# Patient Record
Sex: Male | Born: 1960 | Race: White | Hispanic: No | Marital: Married | Smoking: Former smoker
Health system: Southern US, Community
[De-identification: ages and names within clinical notes are randomized; demographics above are authoritative.]

## PROBLEM LIST (undated history)

## (undated) DIAGNOSIS — R972 Elevated prostate specific antigen [PSA]: Secondary | ICD-10-CM

## (undated) DIAGNOSIS — E039 Hypothyroidism, unspecified: Secondary | ICD-10-CM

## (undated) DIAGNOSIS — G510 Bell's palsy: Secondary | ICD-10-CM

## (undated) DIAGNOSIS — D72829 Elevated white blood cell count, unspecified: Secondary | ICD-10-CM

## (undated) DIAGNOSIS — M1990 Unspecified osteoarthritis, unspecified site: Secondary | ICD-10-CM

## (undated) DIAGNOSIS — I10 Essential (primary) hypertension: Secondary | ICD-10-CM

## (undated) DIAGNOSIS — R9431 Abnormal electrocardiogram [ECG] [EKG]: Secondary | ICD-10-CM

## (undated) DIAGNOSIS — E785 Hyperlipidemia, unspecified: Secondary | ICD-10-CM

## (undated) HISTORY — DX: Abnormal electrocardiogram (ECG) (EKG): R94.31

## (undated) HISTORY — DX: Hypothyroidism, unspecified: E03.9

## (undated) HISTORY — DX: Bell's palsy: G51.0

## (undated) HISTORY — DX: Essential (primary) hypertension: I10

## (undated) HISTORY — DX: Unspecified osteoarthritis, unspecified site: M19.90

## (undated) HISTORY — PX: TYMPANOSTOMY TUBE PLACEMENT: SHX32

## (undated) HISTORY — DX: Elevated prostate specific antigen (PSA): R97.20

## (undated) HISTORY — PX: MOUTH SURGERY: SHX715

## (undated) HISTORY — DX: Elevated white blood cell count, unspecified: D72.829

## (undated) HISTORY — DX: Hyperlipidemia, unspecified: E78.5

## (undated) HISTORY — PX: TONSILLECTOMY AND ADENOIDECTOMY: SUR1326

---

## 2003-12-30 ENCOUNTER — Ambulatory Visit (HOSPITAL_COMMUNITY): Admission: RE | Admit: 2003-12-30 | Discharge: 2003-12-30 | Payer: Self-pay | Admitting: Hematology & Oncology

## 2004-07-12 ENCOUNTER — Ambulatory Visit: Payer: Self-pay | Admitting: Internal Medicine

## 2005-07-17 ENCOUNTER — Ambulatory Visit: Payer: Self-pay | Admitting: Internal Medicine

## 2005-07-19 ENCOUNTER — Ambulatory Visit: Payer: Self-pay | Admitting: Internal Medicine

## 2006-01-15 ENCOUNTER — Ambulatory Visit: Payer: Self-pay | Admitting: Internal Medicine

## 2006-01-17 ENCOUNTER — Encounter: Admission: RE | Admit: 2006-01-17 | Discharge: 2006-03-05 | Payer: Self-pay | Admitting: Internal Medicine

## 2006-05-21 ENCOUNTER — Ambulatory Visit: Payer: Self-pay | Admitting: Internal Medicine

## 2006-11-14 DIAGNOSIS — I10 Essential (primary) hypertension: Secondary | ICD-10-CM | POA: Insufficient documentation

## 2006-11-14 DIAGNOSIS — E039 Hypothyroidism, unspecified: Secondary | ICD-10-CM | POA: Insufficient documentation

## 2006-11-14 HISTORY — DX: Essential (primary) hypertension: I10

## 2007-05-23 ENCOUNTER — Emergency Department (HOSPITAL_COMMUNITY): Admission: EM | Admit: 2007-05-23 | Discharge: 2007-05-23 | Payer: Self-pay | Admitting: Emergency Medicine

## 2007-06-24 ENCOUNTER — Telehealth (INDEPENDENT_AMBULATORY_CARE_PROVIDER_SITE_OTHER): Payer: Self-pay | Admitting: *Deleted

## 2007-12-03 ENCOUNTER — Ambulatory Visit: Payer: Self-pay | Admitting: Internal Medicine

## 2007-12-03 DIAGNOSIS — E785 Hyperlipidemia, unspecified: Secondary | ICD-10-CM

## 2007-12-03 DIAGNOSIS — R9431 Abnormal electrocardiogram [ECG] [EKG]: Secondary | ICD-10-CM

## 2007-12-03 DIAGNOSIS — Z87891 Personal history of nicotine dependence: Secondary | ICD-10-CM | POA: Insufficient documentation

## 2007-12-03 DIAGNOSIS — F17200 Nicotine dependence, unspecified, uncomplicated: Secondary | ICD-10-CM

## 2007-12-03 DIAGNOSIS — Z8669 Personal history of other diseases of the nervous system and sense organs: Secondary | ICD-10-CM | POA: Insufficient documentation

## 2007-12-03 DIAGNOSIS — E1169 Type 2 diabetes mellitus with other specified complication: Secondary | ICD-10-CM | POA: Insufficient documentation

## 2007-12-05 ENCOUNTER — Encounter (INDEPENDENT_AMBULATORY_CARE_PROVIDER_SITE_OTHER): Payer: Self-pay | Admitting: *Deleted

## 2007-12-25 DIAGNOSIS — R9431 Abnormal electrocardiogram [ECG] [EKG]: Secondary | ICD-10-CM

## 2007-12-25 HISTORY — DX: Abnormal electrocardiogram (ECG) (EKG): R94.31

## 2008-01-22 ENCOUNTER — Ambulatory Visit: Payer: Self-pay

## 2008-01-22 ENCOUNTER — Encounter: Payer: Self-pay | Admitting: Internal Medicine

## 2008-02-04 ENCOUNTER — Telehealth (INDEPENDENT_AMBULATORY_CARE_PROVIDER_SITE_OTHER): Payer: Self-pay | Admitting: *Deleted

## 2008-03-03 ENCOUNTER — Ambulatory Visit: Payer: Self-pay | Admitting: Internal Medicine

## 2008-05-18 ENCOUNTER — Ambulatory Visit: Payer: Self-pay | Admitting: Internal Medicine

## 2008-05-18 DIAGNOSIS — J209 Acute bronchitis, unspecified: Secondary | ICD-10-CM

## 2008-05-18 DIAGNOSIS — E119 Type 2 diabetes mellitus without complications: Secondary | ICD-10-CM | POA: Insufficient documentation

## 2008-05-20 ENCOUNTER — Telehealth (INDEPENDENT_AMBULATORY_CARE_PROVIDER_SITE_OTHER): Payer: Self-pay | Admitting: *Deleted

## 2008-05-20 LAB — CONVERTED CEMR LAB
Hgb A1c MFr Bld: 6.2 % — ABNORMAL HIGH (ref 4.6–6.0)
Triglycerides: 133 mg/dL (ref 0–149)

## 2008-06-26 DIAGNOSIS — R972 Elevated prostate specific antigen [PSA]: Secondary | ICD-10-CM

## 2008-06-26 HISTORY — DX: Elevated prostate specific antigen (PSA): R97.20

## 2008-08-10 ENCOUNTER — Encounter (INDEPENDENT_AMBULATORY_CARE_PROVIDER_SITE_OTHER): Payer: Self-pay | Admitting: *Deleted

## 2008-08-10 ENCOUNTER — Ambulatory Visit: Payer: Self-pay | Admitting: Family Medicine

## 2008-08-17 ENCOUNTER — Ambulatory Visit: Payer: Self-pay | Admitting: Family Medicine

## 2008-08-31 ENCOUNTER — Ambulatory Visit: Payer: Self-pay | Admitting: Family Medicine

## 2008-09-17 ENCOUNTER — Ambulatory Visit: Payer: Self-pay | Admitting: Internal Medicine

## 2008-10-16 ENCOUNTER — Encounter (INDEPENDENT_AMBULATORY_CARE_PROVIDER_SITE_OTHER): Payer: Self-pay | Admitting: *Deleted

## 2009-01-06 ENCOUNTER — Ambulatory Visit: Payer: Self-pay | Admitting: Internal Medicine

## 2009-01-07 ENCOUNTER — Ambulatory Visit: Payer: Self-pay | Admitting: Internal Medicine

## 2009-01-11 ENCOUNTER — Telehealth (INDEPENDENT_AMBULATORY_CARE_PROVIDER_SITE_OTHER): Payer: Self-pay | Admitting: *Deleted

## 2009-01-11 DIAGNOSIS — R972 Elevated prostate specific antigen [PSA]: Secondary | ICD-10-CM | POA: Insufficient documentation

## 2009-01-12 LAB — CONVERTED CEMR LAB
ALT: 15 units/L (ref 0–53)
AST: 20 units/L (ref 0–37)
Alkaline Phosphatase: 56 units/L (ref 39–117)
Basophils Relative: 2.2 % (ref 0.0–3.0)
Bilirubin, Direct: 0.1 mg/dL (ref 0.0–0.3)
Calcium: 9.1 mg/dL (ref 8.4–10.5)
Chloride: 104 meq/L (ref 96–112)
Creatinine, Ser: 1.3 mg/dL (ref 0.4–1.5)
Eosinophils Relative: 3.9 % (ref 0.0–5.0)
Hgb A1c MFr Bld: 6.1 % (ref 4.6–6.5)
Lymphocytes Relative: 19.1 % (ref 12.0–46.0)
Lymphs Abs: 2.3 10*3/uL (ref 0.7–4.0)
Monocytes Absolute: 0.7 10*3/uL (ref 0.1–1.0)
Monocytes Relative: 5.5 % (ref 3.0–12.0)
Neutrophils Relative %: 69.3 % (ref 43.0–77.0)
PSA: 8.32 ng/mL — ABNORMAL HIGH (ref 0.10–4.00)
Potassium: 3.8 meq/L (ref 3.5–5.1)
RBC: 4.92 M/uL (ref 4.22–5.81)
Total CHOL/HDL Ratio: 4
VLDL: 24.6 mg/dL (ref 0.0–40.0)

## 2009-01-18 ENCOUNTER — Encounter (INDEPENDENT_AMBULATORY_CARE_PROVIDER_SITE_OTHER): Payer: Self-pay | Admitting: *Deleted

## 2009-01-21 ENCOUNTER — Telehealth: Payer: Self-pay | Admitting: Internal Medicine

## 2009-01-25 ENCOUNTER — Ambulatory Visit: Payer: Self-pay | Admitting: Family Medicine

## 2009-02-04 ENCOUNTER — Encounter: Payer: Self-pay | Admitting: Internal Medicine

## 2009-02-24 HISTORY — PX: VASECTOMY: SHX75

## 2009-02-26 ENCOUNTER — Encounter: Payer: Self-pay | Admitting: Internal Medicine

## 2009-03-24 ENCOUNTER — Encounter: Payer: Self-pay | Admitting: Internal Medicine

## 2009-05-18 ENCOUNTER — Ambulatory Visit: Payer: Self-pay | Admitting: Internal Medicine

## 2009-05-19 ENCOUNTER — Encounter (INDEPENDENT_AMBULATORY_CARE_PROVIDER_SITE_OTHER): Payer: Self-pay | Admitting: *Deleted

## 2009-05-19 LAB — CONVERTED CEMR LAB: Hgb A1c MFr Bld: 6 % (ref 4.6–6.5)

## 2009-09-08 ENCOUNTER — Encounter: Payer: Self-pay | Admitting: Internal Medicine

## 2009-10-12 ENCOUNTER — Telehealth (INDEPENDENT_AMBULATORY_CARE_PROVIDER_SITE_OTHER): Payer: Self-pay | Admitting: *Deleted

## 2009-10-18 ENCOUNTER — Ambulatory Visit: Payer: Self-pay | Admitting: Internal Medicine

## 2009-10-18 DIAGNOSIS — M1990 Unspecified osteoarthritis, unspecified site: Secondary | ICD-10-CM | POA: Insufficient documentation

## 2009-10-21 LAB — CONVERTED CEMR LAB
Creatinine, Ser: 1.1 mg/dL (ref 0.4–1.5)
GFR calc non Af Amer: 75.82 mL/min (ref >60)
Sodium: 142 meq/L (ref 135–145)

## 2010-04-27 ENCOUNTER — Ambulatory Visit: Payer: Self-pay | Admitting: Internal Medicine

## 2010-05-10 ENCOUNTER — Ambulatory Visit: Payer: Self-pay | Admitting: Internal Medicine

## 2010-05-15 LAB — CONVERTED CEMR LAB
ALT: 23 units/L (ref 0–53)
BUN: 14 mg/dL (ref 6–23)
Basophils Relative: 0.9 % (ref 0.0–3.0)
Creatinine,U: 97.7 mg/dL
Glucose, Bld: 122 mg/dL — ABNORMAL HIGH (ref 70–99)
HDL: 38.5 mg/dL — ABNORMAL LOW (ref >39.00)
Hemoglobin: 15.5 g/dL (ref 13.0–17.0)
LDL Cholesterol: 101 mg/dL — ABNORMAL HIGH (ref 0–99)
MCHC: 34.3 g/dL (ref 30.0–36.0)
Microalb, Ur: 1.6 mg/dL (ref 0.0–1.9)
Monocytes Relative: 6.4 % (ref 3.0–12.0)
Neutrophils Relative %: 67.6 % (ref 43.0–77.0)
Platelets: 173 10*3/uL (ref 150.0–400.0)
Potassium: 4 meq/L (ref 3.5–5.1)
Sodium: 140 meq/L (ref 135–145)
Triglycerides: 162 mg/dL — ABNORMAL HIGH (ref 0.0–149.0)
VLDL: 32.4 mg/dL (ref 0.0–40.0)
WBC: 11.8 10*3/uL — ABNORMAL HIGH (ref 4.5–10.5)

## 2010-05-18 ENCOUNTER — Ambulatory Visit: Payer: Self-pay | Admitting: Internal Medicine

## 2010-05-23 LAB — CONVERTED CEMR LAB: PSA: 1.54 ng/mL (ref <=4.00)

## 2010-07-24 LAB — CONVERTED CEMR LAB
AST: 16 units/L (ref 0–37)
BUN: 17 mg/dL (ref 6–23)
Basophils Absolute: 0.1 10*3/uL (ref 0.0–0.1)
Basophils Relative: 0.8 % (ref 0.0–1.0)
Bilirubin Urine: NEGATIVE
Blood in Urine, dipstick: NEGATIVE
Calcium: 9.9 mg/dL (ref 8.4–10.5)
Chloride: 108 meq/L (ref 96–112)
Creatinine, Ser: 1.4 mg/dL (ref 0.4–1.5)
GFR calc Af Amer: 70 mL/min
GFR calc non Af Amer: 58 mL/min
GFR calc non Af Amer: 64 mL/min
Glucose, Bld: 95 mg/dL (ref 70–99)
Glucose, Bld: 96 mg/dL (ref 70–99)
HDL: 36.4 mg/dL — ABNORMAL LOW (ref >39.0)
Hemoglobin: 16.4 g/dL (ref 13.0–17.0)
INR: 1 (ref 0.8–1.0)
Ketones, urine, test strip: NEGATIVE
LDL Cholesterol: 113 mg/dL — ABNORMAL HIGH (ref 0–99)
LDL Cholesterol: 117 mg/dL — ABNORMAL HIGH (ref 0–99)
MCHC: 35.2 g/dL (ref 30.0–36.0)
Neutrophils Relative %: 68.2 % (ref 43.0–77.0)
Nitrite: NEGATIVE
Potassium: 3.8 meq/L (ref 3.5–5.1)
Prothrombin Time: 11.8 s (ref 10.9–13.3)
Sodium: 140 meq/L (ref 135–145)
Sodium: 143 meq/L (ref 135–145)
Specific Gravity, Urine: 1.025
Triglyceride fasting, serum: 103 mg/dL (ref 0–149)
VLDL: 21 mg/dL (ref 0–40)
WBC: 9.7 10*3/uL (ref 4.5–10.5)
pH: 5

## 2010-07-26 NOTE — Consult Note (Signed)
Summary: [facility]  [facility]   Imported By: Lanelle Bal 09/15/2009 09:45:32  _____________________________________________________________________  External Attachment:    Type:   Image     Comment:   External Document

## 2010-07-26 NOTE — Assessment & Plan Note (Signed)
Summary: head cold, no fever///sph   Vital Signs:  Patient profile:   50 year old male Weight:      207.25 pounds Temp:     98.5 degrees F oral Pulse rate:   81 / minute Pulse rhythm:   regular BP sitting:   136 / 86  (left arm) Cuff size:   large  Vitals Entered By: Army Fossa CMA (April 27, 2010 11:26 AM) CC: C/o head congestion, drainage, and couging Comments x 1 week [facility]   History of Present Illness: one-week history of  cough,  post nasal dripping, sinus congestion  ROS He has abundant sputum, color varies from clear to yellow No fever or myalgias No nausea or vomiting No headache No sore throat No wheezing  Current Medications (verified): 1)  Hyzaar 100-25 Mg  Tabs (Losartan Potassium-Hctz) .Marland Kitchen.. 1 By Mouth Once Daily 2)  Labetalol Hcl 200 Mg  Tabs (Labetalol Hcl) .Marland Kitchen.. 1 By Mouth Two Times A Day 3)  Vytorin 10-40 Mg  Tabs (Ezetimibe-Simvastatin) .Marland Kitchen.. 1 By Mouth At Bedtime 4)  Synthroid 50 Mcg  Tabs (Levothyroxine Sodium) .Marland Kitchen.. 1 By Mouth Once Daily 5)  Amlodipine Besylate 10 Mg  Tabs (Amlodipine Besylate) .Marland Kitchen.. 1 By Mouth Once Daily 6)  Bayer Childrens Aspirin 81 Mg Chew (Aspirin)  Allergies (verified): 1)  ! Ceclor 2)  ! Augmentin  Past History:  Past Medical History: Reviewed history from 10/18/2009 and no changes required. Hypertension AODM dx 11-09 A1C 6.2 Hypothyroidism Hyperlipidemia DJD-- L knee  Bell's palsy h/o elevated WBC-- saw Dr Lambert Mody : likely benign, monitor EKG abnormal, stress test (-) 12-2007  Past Surgical History: Reviewed history from 05/18/2009 and no changes required. Tonsillectomy + Adenoidectomy Ear tubes B as a child vasectomy 02-2009  Social History: Reviewed history from 10/18/2009 and no changes required. married  2 step kids Current Smoker 1 1/2 ppd Alcohol use-yes (rare) Drug use-no Regular exercise-no but active  baseball player from elementary to college caffeine use - 6 diet sodas once  daily   Physical Exam  General:  alert, well-developed, and well-nourished.   Head:  face symmetric, nontender to palpation Ears:  R ear normal and L ear normal.   Nose:  slightly congested Mouth:  slightly red without discharge Lungs:  mild rhonchi with cough, no wheezing, no distress Heart:  normal rate, regular rhythm, and no murmur.     Impression & Recommendations:  Problem # 1:  BRONCHITIS- ACUTE (ICD-466.0) see  instructions His updated medication list for this problem includes:    Zithromax Z-pak 250 Mg Tabs (Azithromycin) .Marland Kitchen... As directed    Tussigon 5-1.5 Mg Tabs (Hydrocodone-homatropine) .Marland Kitchen... 1 tsp every 6 hours as neded for cough  Problem # 2:  due for a physical has it scheduled in about 3 weeks from now, see instructions   Complete Medication List: 1)  Hyzaar 100-25 Mg Tabs (Losartan potassium-hctz) .Marland Kitchen.. 1 by mouth once daily 2)  Labetalol Hcl 200 Mg Tabs (Labetalol hcl) .Marland Kitchen.. 1 by mouth two times a day 3)  Vytorin 10-40 Mg Tabs (Ezetimibe-simvastatin) .Marland Kitchen.. 1 by mouth at bedtime 4)  Synthroid 50 Mcg Tabs (Levothyroxine sodium) .Marland Kitchen.. 1 by mouth once daily 5)  Amlodipine Besylate 10 Mg Tabs (Amlodipine besylate) .Marland Kitchen.. 1 by mouth once daily 6)  Bayer Childrens Aspirin 81 Mg Chew (Aspirin) 7)  Zithromax Z-pak 250 Mg Tabs (Azithromycin) .... As directed 8)  Tussigon 5-1.5 Mg Tabs (Hydrocodone-homatropine) .Marland Kitchen.. 1 tsp every 6 hours as neded for cough  Patient Instructions: 1)  rest, fluiods, tylenol 2)  zithromax x 5 days 3)  for cough, mucinex DM as needed. If the cough is severe, use hydrocodone (will make you sleepy) 4)  call if no better in few days 5)  few days before your physical came back fasting for labs: 6)  FLP BMP AS ALT TSH CBC---dx v70 7)  A1C ,microalb.---dx DM Prescriptions: TUSSIGON 5-1.5 MG TABS (HYDROCODONE-HOMATROPINE) 1 tsp every 6 hours as neded for cough  #120cc x 0   Entered and Authorized by:   Nolon Rod. Paz MD   Signed by:   Nolon Rod. Paz MD on  04/27/2010   Method used:   Print then Give to Patient   RxID:   [id number] ZITHROMAX Z-PAK 250 MG TABS (AZITHROMYCIN) as directed  #1 x 0   Entered and Authorized by:   Nolon Rod. Paz MD   Signed by:   Nolon Rod. Paz MD on 04/27/2010   Method used:   Print then Give to Patient   RxID:   [id number]    Orders Added: 1)  Est. Patient Level III [70623]   Pharmacy called to notify that rx for Tussigon was written for tabs with liquid syrup instructions. I made them aware to fill for liquid per instructions above. Floydene Flock  April 27, 2010 12:11 PM  thank you  Fenwick E. Paz MD  April 27, 2010 5:04 PM

## 2010-07-26 NOTE — Progress Notes (Signed)
Summary: REFILL  Phone Note Refill Request Message from:  Fax from Pharmacy on October 12, 2009 3:40 PM  LEVOTHYROXINE 0.05MF [address] Valinda Hoar [phone]   Method Requested: Fax to Local Pharmacy Next Appointment Scheduled: 10/18/2009 Initial call taken by: Barb Merino,  October 12, 2009 3:41 PM    Prescriptions: SYNTHROID 50 MCG  TABS (LEVOTHYROXINE SODIUM) 1 by mouth once daily  #30.0 Each x 2   Entered by:   Shary Decamp   Authorized by:   Nolon Rod. Paz MD   Signed by:   Shary Decamp on 10/12/2009   Method used:   Electronically to        [facility] #[id number]* (retail)       [address]       [place], [place]  [address]       Ph: [phone]       Fax: [phone]   RxID:   [id number]

## 2010-07-26 NOTE — Assessment & Plan Note (Signed)
Summary: cpx///sph   Vital Signs:  Patient profile:   50 year old male Weight:      208.25 pounds Pulse rate:   76 / minute Pulse rhythm:   regular BP sitting:   134 / 86  (left arm) Cuff size:   large  Vitals Entered By: Army Fossa CMA (May 18, 2010 1:54 PM) CC: CPX, not fasting Comments Still having some chest congestion [facility] [address] Declines flu shot    History of Present Illness: CPX   Preventive Screening-Counseling & Management  Alcohol-Tobacco     Packs/Day: 1  Current Medications (verified): 1)  Hyzaar 100-25 Mg  Tabs (Losartan Potassium-Hctz) .Marland Kitchen.. 1 By Mouth Once Daily 2)  Labetalol Hcl 200 Mg  Tabs (Labetalol Hcl) .Marland Kitchen.. 1 By Mouth Two Times A Day 3)  Vytorin 10-40 Mg  Tabs (Ezetimibe-Simvastatin) .Marland Kitchen.. 1 By Mouth At Bedtime 4)  Synthroid 50 Mcg  Tabs (Levothyroxine Sodium) .Marland Kitchen.. 1 By Mouth Once Daily 5)  Amlodipine Besylate 10 Mg  Tabs (Amlodipine Besylate) .Marland Kitchen.. 1 By Mouth Once Daily 6)  Bayer Childrens Aspirin 81 Mg Chew (Aspirin)  Allergies (verified): 1)  ! Ceclor 2)  ! Augmentin  Past History:  Past Medical History: Hypertension AODM dx 11-09 A1C 6.2 Hypothyroidism Hyperlipidemia DJD-- L knee  Bell's palsy h/o elevated WBC-- saw Dr Lambert Mody : likely benign, monitor EKG abnormal, stress test (-) 12-2007 PSA elevated 2010, saw urology, PSA was rechecked and found to be normal. Bx was canceled; did get a u/s d/t microhematuris--> normal   Past Surgical History: Reviewed history from 05/18/2009 and no changes required. Tonsillectomy + Adenoidectomy Ear tubes B as a child vasectomy 02-2009  Family History: Reviewed history from 12/03/2007 and no changes required. PT ADOPTED  Social History: married  2 step kids Current Smoker 1 1/2 ppd Alcohol use-yes (rare) Drug use-no Regular exercise-no but active (yard work mostly) not doing much lately. Taking care of his parents. baseball player from elementary to college caffeine use -  6 diet sodas once daily  job-- Designer, industrial/product  Packs/Day:  1  Review of Systems General:  Denies fatigue, fever, and weight loss. CV:  Denies chest pain or discomfort and swelling of feet. Resp:  Denies coughing up blood; still has chest congestion, some cough, clear mucus no SOB some wheezing at night . GI:  Denies bloody stools, diarrhea, nausea, and vomiting. GU:  Denies dysuria, hematuria, urinary frequency, and urinary hesitancy. Psych:  ++ stress due to parent's health but handeling it ok.  Physical Exam  General:  alert, well-developed, and overweight-appearing.   Neck:  no masses and no thyromegaly.   Lungs:  normal respiratory effort, no intercostal retractions, no accessory muscle use, and normal breath sounds.   Heart:  normal rate, regular rhythm, and no murmur.   Abdomen:  soft, non-tender, no distention, no masses, no guarding, and no rigidity.   Rectal:  No external abnormalities noted. Normal sphincter tone. No rectal masses or tenderness.   Prostate:  Prostate gland firm and smooth, no enlargement, nodularity, tenderness, mass, asymmetry or induration. Extremities:  no pretibial edema bilaterally  Psych:  Cognition and judgment appear intact. Alert and cooperative with normal attention span and concentration, not anxious appearing and not depressed appearing.     Impression & Recommendations:  Problem # 1:  HEALTH SCREENING (ICD-V70.0) Td 2005 flu shot --declined,  explained benefits  12-2003: Cscope , TICs, hyperplastic polyps---next 2013 we'll order a PSA  CV RF: I discussed the patient  and that he is a heavy smoker, has diabetes, high cholesterol and hypertension. These puts him at  a high risk for heart disease, strokes, kidney failure.  The best thing he can do is to become more physical active, quit tobacco; we need to work together on keeping his diabetes cholesterol and BP under control.  his labs showed that the diabetes is well  controlled His LDL is 101, needs better control. Instead of increasing his medication, I recommend him to work on his lifestyle  Orders: Venipuncture ([address]) Specimen Handling (60454)  Problem # 2:  BRONCHITIS-ACUTE (ICD-466.0) symptoms slight improving   Complete Medication List: 1)  Hyzaar 100-25 Mg Tabs (Losartan potassium-hctz) .Marland Kitchen.. 1 by mouth once daily 2)  Labetalol Hcl 200 Mg Tabs (Labetalol hcl) .Marland Kitchen.. 1 by mouth two times a day 3)  Vytorin 10-40 Mg Tabs (Ezetimibe-simvastatin) .Marland Kitchen.. 1 by mouth at bedtime 4)  Synthroid 50 Mcg Tabs (Levothyroxine sodium) .Marland Kitchen.. 1 by mouth once daily 5)  Amlodipine Besylate 10 Mg Tabs (Amlodipine besylate) .Marland Kitchen.. 1 by mouth once daily 6)  Bayer Childrens Aspirin 81 Mg Chew (Aspirin)  Patient Instructions: 1)  Please schedule a follow-up appointment in 3 months .    Orders Added: 1)  Venipuncture [09811] 2)  Specimen Handling [99000] 3)  Est. Patient age 26-64 [92]     Risk Factors:

## 2010-07-26 NOTE — Assessment & Plan Note (Signed)
Summary: 4 mo. f/u - jr   Vital Signs:  Patient profile:   50 year old male Height:      71.5 inches Weight:      213 pounds BMI:     29.40 Pulse rate:   64 / minute BP sitting:   122 / 80  Vitals Entered By: Shary Decamp (October 18, 2009 8:04 AM) CC: rov, fasting   History of Present Illness: Hypertension-- no ambulatory BPs  AODM -- diet ok,exercise limited by knee pain  Hypothyroidism-- good medication compliance  Hyperlipidemia-- good medication compliance, no s/e that he can tell from the cholesterol medicine  Current Medications (verified): 1)  Hyzaar 100-25 Mg  Tabs (Losartan Potassium-Hctz) .Marland Kitchen.. 1 By Mouth Once Daily 2)  Vytorin 10-40 Mg  Tabs (Ezetimibe-Simvastatin) .Marland Kitchen.. 1 By Mouth At Bedtime 3)  Synthroid 50 Mcg  Tabs (Levothyroxine Sodium) .Marland Kitchen.. 1 By Mouth Once Daily 4)  Labetalol Hcl 200 Mg  Tabs (Labetalol Hcl) .Marland Kitchen.. 1 By Mouth Two Times A Day 5)  Amlodipine Besylate 10 Mg  Tabs (Amlodipine Besylate) .Marland Kitchen.. 1 By Mouth Once Daily 6)  Bayer Childrens Aspirin 81 Mg Chew (Aspirin)  Allergies (verified): 1)  ! Ceclor 2)  ! 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 Resp:  Denies cough and shortness of breath. GI:  Denies diarrhea, nausea, and vomiting. MS:  had severe pain-swelling L knee, was seen at the UC,  doing better .  Physical Exam  General:  alert and well-developed.   Neck:  normal carotid upstroke.   Lungs:  normal respiratory effort, no intercostal retractions, no accessory muscle use, and normal breath sounds.   Heart:  normal rate, regular rhythm, and no murmur.   Extremities:  no pretibial edema bilaterally  Psych:  Oriented X3, memory intact for recent and remote, normally interactive, good eye contact, not anxious appearing, and not depressed appearing.     Impression & Recommendations:  Problem # 1:  DEGENERATIVE JOINT DISEASE (ICD-715.90) L knee OA, was seen at an UC with pain and swelling, doing better long h/o problems in that  knee , may need to see ortho at some point His updated medication list for this problem includes:    Bayer Childrens Aspirin 81 Mg Chew (Aspirin)  Problem # 2:  AODM (ICD-250.00) labs, encouraged diet-exercise  His updated medication list for this problem includes:    Hyzaar 100-25 Mg Tabs (Losartan potassium-hctz) .Marland Kitchen... 1 by mouth once daily    Bayer Childrens Aspirin 81 Mg Chew (Aspirin)  Orders: TLB-A1C / Hgb A1C (Glycohemoglobin) (83036-A1C)  Labs Reviewed: Creat: 1.3 (01/07/2009)    Reviewed HgBA1c results: 6.0 (05/18/2009)  6.1 (01/07/2009)  Problem # 3:  HYPERLIPIDEMIA (ICD-272.4) at goal , RF His updated medication list for this problem includes:    Vytorin 10-40 Mg Tabs (Ezetimibe-simvastatin) .Marland Kitchen... 1 by mouth at bedtime  Labs Reviewed: SGOT: 20 (01/07/2009)   SGPT: 15 (01/07/2009)   HDL:34.40 (01/07/2009), 35.0 (05/18/2008)  LDL:93 (01/07/2009), 92 (05/18/2008)  Chol:152 (01/07/2009), 154 (05/18/2008)  Trig:123.0 (01/07/2009), 133 (05/18/2008)  Problem # 4:  HYPERTENSION (ICD-401.9) at goal, labs, RF His updated medication list for this problem includes:    Hyzaar 100-25 Mg Tabs (Losartan potassium-hctz) .Marland Kitchen... 1 by mouth once daily    Labetalol Hcl 200 Mg Tabs (Labetalol hcl) .Marland Kitchen... 1 by mouth two times a day     Amlodipine Besylate 10 Mg Tabs (Amlodipine besylate) .Marland Kitchen... 1 by mouth once daily  BP today: 122/80 Prior BP: 118/60 (05/18/2009)  Labs Reviewed: K+: 3.8 (01/07/2009) Creat: : 1.3 (01/07/2009)   Chol: 152 (01/07/2009)   HDL: 34.40 (01/07/2009)   LDL: 93 (01/07/2009)   TG: 123.0 (01/07/2009)  Orders: Venipuncture ([address]) TLB-BMP (Basic Metabolic Panel-BMET) (80048-METABOL)  Complete Medication List: 1)  Hyzaar 100-25 Mg Tabs (Losartan potassium-hctz) .Marland Kitchen.. 1 by mouth once daily 2)  Labetalol Hcl 200 Mg Tabs (Labetalol hcl) .Marland Kitchen.. 1 by mouth two times a day 3)  Vytorin 10-40 Mg Tabs (Ezetimibe-simvastatin) .Marland Kitchen.. 1 by mouth at bedtime 4)  Synthroid 50 Mcg Tabs (Levothyroxine sodium) .Marland Kitchen.. 1 by mouth once daily 5)  Amlodipine Besylate 10 Mg Tabs (Amlodipine besylate) .Marland Kitchen.. 1 by mouth once daily 6)  Bayer Childrens Aspirin 81 Mg Chew (Aspirin)  Patient Instructions: 1)  Please schedule a follow-up appointment in 4 months .  Prescriptions: AMLODIPINE BESYLATE 10 MG  TABS (AMLODIPINE BESYLATE) 1 by mouth once daily  #30.0 Each x 12   Entered and Authorized by:   Jemima Petko E. Tyger Wichman MD   Signed by:   Nolon Rod. Alexarae Oliva MD on 10/18/2009   Method used:   Electronically to        [facility] #[id number]* (retail)       [address]       [place], [place]  [address]       Ph: [phone]       Fax: [phone]   RxID:   [id number] SYNTHROID 50 MCG  TABS (LEVOTHYROXINE SODIUM) 1 by mouth once daily  #30.0 Each x 12   Entered and Authorized by:   Nolon Rod. Antino Mayabb MD   Signed by:   Nolon Rod. Jacaden Forbush MD on 10/18/2009   Method used:   Electronically to        [facility] #[id number]* (retail)       [address]       [place], [place]  [address]       Ph: [phone]       Fax: [phone]   RxID:   [id number] VYTORIN 10-40 MG  TABS (EZETIMIBE-SIMVASTATIN) 1 by mouth at bedtime  #30.0 Each x 12   Entered and Authorized by:   Genoveva Singleton  E. Emilyann Banka MD   Signed by:   Nolon Rod. Valjean Ruppel MD on 10/18/2009   Method used:   Electronically to        [facility] #[id number]* (retail)       [address]       [place], [place]  [address]       Ph: [phone]       Fax: [phone]   RxID:   [id number] HYZAAR 100-25 MG  TABS (LOSARTAN POTASSIUM-HCTZ) 1 by mouth once  daily  #30.0 Each x 12   Entered and Authorized by:   Katrinka Herbison E. Izaak Sahr MD   Signed by:   Nolon Rod. Tavio Biegel MD on 10/18/2009   Method used:   Electronically to        [facility] [phone]* (retail)       [address]       [place], [place]  [address]       Ph: [phone]       Fax: [phone]   RxID:   [id number] LABETALOL HCL 200 MG  TABS (LABETALOL HCL) 1 by mouth two times a day  #60.0 Each x 12   Entered and Authorized by:   Nolon Rod. Russia Scheiderer MD   Signed by:   Nolon Rod. Adeliz Tonkinson MD on 10/18/2009   Method used:   Electronically to        [facility] #[id number]* (retail)       [address]       [place], [place]  [address]       Ph: [phone]       Fax: [phone]   RxID:   [id number]

## 2010-08-18 ENCOUNTER — Ambulatory Visit: Payer: Self-pay | Admitting: Internal Medicine

## 2010-08-19 ENCOUNTER — Other Ambulatory Visit: Payer: Self-pay | Admitting: Internal Medicine

## 2010-08-19 ENCOUNTER — Encounter: Payer: Self-pay | Admitting: Internal Medicine

## 2010-08-19 ENCOUNTER — Ambulatory Visit (INDEPENDENT_AMBULATORY_CARE_PROVIDER_SITE_OTHER): Payer: 59 | Admitting: Internal Medicine

## 2010-08-19 DIAGNOSIS — E119 Type 2 diabetes mellitus without complications: Secondary | ICD-10-CM

## 2010-08-19 DIAGNOSIS — E039 Hypothyroidism, unspecified: Secondary | ICD-10-CM

## 2010-08-19 DIAGNOSIS — E785 Hyperlipidemia, unspecified: Secondary | ICD-10-CM

## 2010-08-19 LAB — LIPID PANEL
Cholesterol: 145 mg/dL (ref 0–200)
LDL Cholesterol: 93 mg/dL (ref 0–99)
Total CHOL/HDL Ratio: 4
VLDL: 17.8 mg/dL (ref 0.0–40.0)

## 2010-08-23 NOTE — Assessment & Plan Note (Signed)
Summary: 3 month ov/ph   Vital Signs:  Patient profile:   50 year old male Height:      715 inches Weight:      213 pounds BMI:     0.29 Pulse rate:   78 / minute Pulse rhythm:   regular BP sitting:   132 / 78  (left arm) Cuff size:   large  Vitals Entered By: Army Fossa CMA (August 19, 2010 9:36 AM) CC: 3 month f/u- fasting  Comments no complaints [facility] [facility]   History of Present Illness:  routine office visit  since the last time, his lifestyle has not changed much, he continues smoking, he has gained a few pounds  ROS  good medication compliance with all his medicines  ambulatory blood pressures are within normal limits stress   has been slightly higher than usual, his wife had extensive surgery at [facility]   Current Medications (verified): 1)  Hyzaar 100-25 Mg  Tabs (Losartan Potassium-Hctz) .Marland Kitchen.. 1 By Mouth Once Daily 2)  Labetalol Hcl 200 Mg  Tabs (Labetalol Hcl) .Marland Kitchen.. 1 By Mouth Two Times A Day 3)  Vytorin 10-40 Mg  Tabs (Ezetimibe-Simvastatin) .Marland Kitchen.. 1 By Mouth At Bedtime 4)  Synthroid 50 Mcg  Tabs (Levothyroxine Sodium) .Marland Kitchen.. 1 By Mouth Once Daily 5)  Amlodipine Besylate 10 Mg  Tabs (Amlodipine Besylate) .Marland Kitchen.. 1 By Mouth Once Daily 6)  Bayer Childrens Aspirin 81 Mg Chew (Aspirin)  Allergies (verified): 1)  ! Ceclor 2)  ! Augmentin  Past History:  Past Medical History: Reviewed history from 05/18/2010 and no changes required. Hypertension AODM dx 11-09 A1C 6.2 Hypothyroidism Hyperlipidemia DJD-- L knee  Bell's palsy h/o elevated WBC-- saw Dr Lambert Mody : likely benign, monitor EKG abnormal, stress test (-) 12-2007 PSA elevated 2010, saw urology, PSA was rechecked and found to be normal. Bx was canceled; did get a u/s d/t microhematuris--> normal   Past Surgical History: Reviewed history from 05/18/2009 and no changes required. Tonsillectomy + Adenoidectomy Ear tubes B as a child vasectomy 02-2009  Social History: Reviewed history from  05/18/2010 and no changes required. married  2 step kids Current Smoker 1 1/2 ppd Alcohol use-yes (rare) Drug use-no Regular exercise-no but active (yard work mostly) not doing much lately. Taking care of his parents. 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   Lungs:  normal respiratory effort, no intercostal retractions, no accessory muscle use, and normal breath sounds.   Heart:  normal rate, regular rhythm, and no murmur.   Extremities:  no pretibial edema bilaterally    Impression & Recommendations:  Problem # 1:  HYPERLIPIDEMIA (ICD-272.4) his last LDL was 101, goal 70. labs, if not at goal will change to crestor  His updated medication list for this problem includes:    Vytorin 10-40 Mg Tabs (Ezetimibe-simvastatin) .Marland Kitchen... 1 by mouth at bedtime  Orders: TLB-Lipid Panel (80061-LIPID) Specimen Handling (04540)  Labs Reviewed: SGOT: 25 (05/10/2010)   SGPT: 23 (05/10/2010)   HDL:38.50 (05/10/2010), 34.40 (01/07/2009)  LDL:101 (05/10/2010), 93 (98/04/9146)  Chol:172 (05/10/2010), 152 (01/07/2009)  Trig:162.0 (05/10/2010), 123.0 (01/07/2009)  Problem # 2:  AODM (ICD-250.00) labs  I again encouraged diet, exercise, quit tobacco   Again  I discussed that without  a healthy lifestyle, it would be more difficult to get him to goal and decrease his cardiovascular risk factors His updated medication list for  this problem includes:    Hyzaar 100-25 Mg Tabs (Losartan potassium-hctz) .Marland Kitchen... 1 by mouth once daily    Bayer Childrens Aspirin 81 Mg Chew (Aspirin)  Orders: Venipuncture ([address]40) TLB-A1C / Hgb A1C (Glycohemoglobin) (83036-A1C)  Labs Reviewed: Creat: 1.3 (05/10/2010)    Reviewed HgBA1c results: 6.4 (05/10/2010)  6.5 (10/18/2009)  Problem # 3:  HYPOTHYROIDISM (ICD-244.9)  his last TSH was 4.9, I increase his Synthroid from  50 to 75 mg hoping that this change may help with triglycerides and cholesterol control His updated medication list for this problem includes:    Synthroid 75 Mcg Tabs (Levothyroxine sodium) .Marland Kitchen... 1 by mouth once daily  Labs Reviewed: TSH: 4.97 (05/10/2010)    HgBA1c: 6.4 (05/10/2010) Chol: 172 (05/10/2010)   HDL: 38.50 (05/10/2010)   LDL: 101 (05/10/2010)   TG: 162.0 (05/10/2010)  Complete Medication List: 1)  Hyzaar 100-25 Mg Tabs (Losartan potassium-hctz) .Marland Kitchen.. 1 by mouth once daily 2)  Labetalol Hcl 200 Mg Tabs (Labetalol hcl) .Marland Kitchen.. 1 by mouth two times a day 3)  Vytorin 10-40 Mg Tabs (Ezetimibe-simvastatin) .Marland Kitchen.. 1 by mouth at bedtime 4)  Synthroid 75 Mcg Tabs (Levothyroxine sodium) .Marland Kitchen.. 1 by mouth once daily 5)  Amlodipine Besylate 10 Mg Tabs (Amlodipine besylate) .Marland Kitchen.. 1 by mouth once daily 6)  Bayer Childrens Aspirin 81 Mg Chew (Aspirin)  Patient Instructions: 1)  Please schedule a follow-up appointment in 3 months .  Prescriptions: SYNTHROID 75 MCG TABS (LEVOTHYROXINE SODIUM) 1 by mouth once daily  #90 x 1   Entered and Authorized by:   Elita Quick E. Deunta Beneke MD   Signed by:   Nolon Rod. Anne Sebring MD on 08/19/2010   Method used:   Electronically to        [facility] [phone]* (retail)       [address]       [place], [place]  [address]       Ph: [phone]       Fax: [phone]   RxID:   [id number]    Orders Added: 1)  Venipuncture [36415] 2)  TLB-A1C / Hgb A1C (Glycohemoglobin) [83036-A1C] 3)  TLB-Lipid Panel [80061-LIPID] 4)  Specimen Handling [99000] 5)  Est. Patient Level III [64[address]]

## 2010-10-31 ENCOUNTER — Other Ambulatory Visit: Payer: Self-pay | Admitting: Internal Medicine

## 2010-11-13 ENCOUNTER — Emergency Department (HOSPITAL_BASED_OUTPATIENT_CLINIC_OR_DEPARTMENT_OTHER)
Admission: EM | Admit: 2010-11-13 | Discharge: 2010-11-13 | Disposition: A | Discharge status: Home / Self Care | Payer: 59 | Attending: Emergency Medicine | Admitting: Emergency Medicine

## 2010-11-13 DIAGNOSIS — E039 Hypothyroidism, unspecified: Secondary | ICD-10-CM | POA: Insufficient documentation

## 2010-11-13 DIAGNOSIS — Y92009 Unspecified place in unspecified non-institutional (private) residence as the place of occurrence of the external cause: Secondary | ICD-10-CM | POA: Insufficient documentation

## 2010-11-13 DIAGNOSIS — I10 Essential (primary) hypertension: Secondary | ICD-10-CM | POA: Insufficient documentation

## 2010-11-13 DIAGNOSIS — S61409A Unspecified open wound of unspecified hand, initial encounter: Secondary | ICD-10-CM | POA: Insufficient documentation

## 2010-11-13 DIAGNOSIS — W010XXA Fall on same level from slipping, tripping and stumbling without subsequent striking against object, initial encounter: Secondary | ICD-10-CM | POA: Insufficient documentation

## 2010-11-13 DIAGNOSIS — E785 Hyperlipidemia, unspecified: Secondary | ICD-10-CM | POA: Insufficient documentation

## 2010-11-22 ENCOUNTER — Ambulatory Visit (INDEPENDENT_AMBULATORY_CARE_PROVIDER_SITE_OTHER)
Admission: RE | Admit: 2010-11-22 | Discharge: 2010-11-22 | Disposition: A | Discharge status: Home / Self Care | Payer: 59 | Source: Ambulatory Visit | Attending: Internal Medicine | Admitting: Internal Medicine

## 2010-11-22 ENCOUNTER — Ambulatory Visit (INDEPENDENT_AMBULATORY_CARE_PROVIDER_SITE_OTHER): Payer: 59 | Admitting: Internal Medicine

## 2010-11-22 ENCOUNTER — Encounter: Payer: Self-pay | Admitting: Internal Medicine

## 2010-11-22 DIAGNOSIS — S93409A Sprain of unspecified ligament of unspecified ankle, initial encounter: Secondary | ICD-10-CM

## 2010-11-22 DIAGNOSIS — E039 Hypothyroidism, unspecified: Secondary | ICD-10-CM

## 2010-11-22 DIAGNOSIS — E785 Hyperlipidemia, unspecified: Secondary | ICD-10-CM

## 2010-11-22 DIAGNOSIS — I10 Essential (primary) hypertension: Secondary | ICD-10-CM

## 2010-11-22 DIAGNOSIS — S61409A Unspecified open wound of unspecified hand, initial encounter: Secondary | ICD-10-CM | POA: Insufficient documentation

## 2010-11-22 LAB — BASIC METABOLIC PANEL
CO2: 25 mEq/L (ref 19–32)
Chloride: 106 mEq/L (ref 96–112)
Creatinine, Ser: 1 mg/dL (ref 0.4–1.5)
Glucose, Bld: 115 mg/dL — ABNORMAL HIGH (ref 70–99)
Sodium: 143 mEq/L (ref 135–145)

## 2010-11-22 LAB — TSH: TSH: 3.49 u[IU]/mL (ref 0.35–5.50)

## 2010-11-22 LAB — LIPID PANEL
LDL Cholesterol: 89 mg/dL (ref 0–99)
Triglycerides: 123 mg/dL (ref 0.0–149.0)
VLDL: 24.6 mg/dL (ref 0.0–40.0)

## 2010-11-22 LAB — ALT: ALT: 17 U/L (ref 0–53)

## 2010-11-22 LAB — AST: AST: 18 U/L (ref 0–37)

## 2010-11-22 NOTE — Assessment & Plan Note (Signed)
Tolerates Crestor well. Labs

## 2010-11-22 NOTE — Assessment & Plan Note (Signed)
labs

## 2010-11-22 NOTE — Assessment & Plan Note (Addendum)
Here for stitches removal, the distal area of the wound is not completely healed but I will go ahead and remove the stitches, I'm afraid that if I leave them in, he may develop infection. Wound care  Discussed.

## 2010-11-22 NOTE — Assessment & Plan Note (Signed)
The patient injured his ankle, is not getting much better. Recommend leg elevation, ice, Motrin. We'll get x-ray. If the x-ray is negative, will consider ultrasound to rule out a clot.

## 2010-11-22 NOTE — Progress Notes (Signed)
  Subjective:    Patient ID: IMRI LOR, male    DOB: Feb 27, 1961, 50 y.o.   MRN: [id number]  HPI Had a fall 9 days ago, went to the ER: stitches were put on the right hand. He also twisted his left ankle, he continued to have pain which is about the same, he continued to have swelling which is on and off and actually not getting any better. No x-rays were done. Hyperlipidemia, base on  last cholesterol panel was switch him from Vytorin to Crestor. Good medication compliance.  Past Medical History  Diagnosis Date  . Hypertension   . Diabetes mellitus 11/09    A1c- 6.2  . Hypothyroidism   . Hyperlipidemia   . DJD (degenerative joint disease)     l knee  . Bell's palsy   . Elevated WBC count     saw Dr.Enenever; likely benign, monitor  . EKG, abnormal 12-2007    Stress test (-)   . Elevated PSA 2010    saw urology, PSA was reched and found to be normal. Bx was canceled, did get a u/s d/t michrohematuris- normal   Past Surgical History  Procedure Date  . Tonsillectomy and adenoidectomy   . Tympanostomy tube placement     B as a child  . Vasectomy 02/2009     Review of Systems Denies chest pain or shortness of breath Tolerating crestor  well, denies any myalgias. As far as the diabetes, he does not check ambulatory blood sugars, there has been some positive changes on his diet. Denies any redness, discharge or swelling in the area of the stitches in his right hand.     Objective:   Physical Exam  Constitutional: He is oriented to person, place, and time. He appears well-developed and well-nourished.  Cardiovascular: Normal rate, regular rhythm and normal heart sounds.   No murmur heard. Pulmonary/Chest: Effort normal and breath sounds normal. No respiratory distress. He has no wheezes. He has no rales.  Musculoskeletal:       Right ankle and foot normal. Left ankle swollen, tender and the external malleolus. No deformity. Range of motion slightly decreased due to  swelling and pain. Good pedal pulses and capillary refill throughout. Calves: Symmetric and nontender.  Neurological: He is alert and oriented to person, place, and time.  Skin:       Has a 2 cm laceration on the palmar side of the right hand, no redness, discharge, swelling. The most distal area is not completely healed.  Psychiatric: He has a normal mood and affect. His behavior is normal. Judgment and thought content normal.          Assessment & Plan:

## 2010-11-22 NOTE — Assessment & Plan Note (Signed)
Continue same medicines. Labs

## 2010-11-23 ENCOUNTER — Telehealth: Payer: Self-pay

## 2010-11-23 DIAGNOSIS — T148XXA Other injury of unspecified body region, initial encounter: Secondary | ICD-10-CM

## 2010-11-23 NOTE — Telephone Encounter (Signed)
Pt calling would like to know results of ankle xray

## 2010-11-24 NOTE — Telephone Encounter (Signed)
Please apologize for the delay. The x-rays showed possible an avulsion fracture, please arrange a orthopedic referral. Keep the leg elevated. Cholesterol improve, closer to goal. Continue with Crestor. TSH okay, continue with same center

## 2010-11-24 NOTE — Telephone Encounter (Signed)
Pt calling again to check on xray results

## 2010-11-24 NOTE — Telephone Encounter (Signed)
I spoke w/ pt he is aware.  

## 2010-11-24 NOTE — Telephone Encounter (Signed)
Addended by: Army Fossa R on: 11/24/2010 02:26 PM   Modules accepted: Orders

## 2010-12-05 ENCOUNTER — Other Ambulatory Visit: Payer: Self-pay | Admitting: Internal Medicine

## 2011-01-07 ENCOUNTER — Other Ambulatory Visit: Payer: Self-pay | Admitting: Internal Medicine

## 2011-02-15 ENCOUNTER — Other Ambulatory Visit: Payer: Self-pay | Admitting: Internal Medicine

## 2011-03-28 ENCOUNTER — Encounter: Payer: Self-pay | Admitting: Internal Medicine

## 2011-03-28 ENCOUNTER — Ambulatory Visit (INDEPENDENT_AMBULATORY_CARE_PROVIDER_SITE_OTHER): Payer: 59 | Admitting: Internal Medicine

## 2011-03-28 DIAGNOSIS — E119 Type 2 diabetes mellitus without complications: Secondary | ICD-10-CM

## 2011-03-28 DIAGNOSIS — I10 Essential (primary) hypertension: Secondary | ICD-10-CM

## 2011-03-28 LAB — BASIC METABOLIC PANEL
BUN: 13 mg/dL (ref 6–23)
Calcium: 9.1 mg/dL (ref 8.4–10.5)
Chloride: 102 mEq/L (ref 96–112)
Sodium: 139 mEq/L (ref 135–145)

## 2011-03-28 NOTE — Progress Notes (Signed)
  Subjective:    Patient ID: Alex Phillips, male    DOB: 1960-06-30, 50 y.o.   MRN: [id number]  HPI ROV Doing well  Past Medical History: Hypertension AODM dx 11-09 A1C 6.2 Hypothyroidism Hyperlipidemia DJD-- L knee  Bell's palsy h/o elevated WBC-- saw Dr Lambert Mody : likely benign, monitor EKG abnormal, stress test (-) 12-2007 PSA elevated 2010, saw urology, PSA was rechecked and found to be normal. Bx was canceled; did get a u/s d/t microhematuris--> normal   Past Surgical History: Tonsillectomy + Adenoidectomy Ear tubes B as a child vasectomy 02-2009   Review of Systems Good med compliance No CP-SOB No N-V-D     Objective:   Physical Exam  Constitutional: He is oriented to person, place, and time. He appears well-developed and well-nourished. No distress.  Cardiovascular: Normal rate, regular rhythm and normal heart sounds.   No murmur heard. Pulmonary/Chest: Effort normal and breath sounds normal. No respiratory distress. He has no wheezes. He has no rales.  Musculoskeletal: He exhibits no edema.  Neurological: He is alert and oriented to person, place, and time.  Skin: He is not diaphoretic.          Assessment & Plan:  Declined flu shot

## 2011-03-29 NOTE — Assessment & Plan Note (Signed)
Well controlled, labs  

## 2011-03-29 NOTE — Assessment & Plan Note (Signed)
Due for labs

## 2011-03-30 LAB — HEMOGLOBIN A1C: Hgb A1c MFr Bld: 6.5 % (ref 4.6–6.5)

## 2011-03-31 NOTE — Progress Notes (Signed)
Quick Note:    Labs mailed.  ______

## 2011-06-28 ENCOUNTER — Ambulatory Visit (INDEPENDENT_AMBULATORY_CARE_PROVIDER_SITE_OTHER): Payer: 59 | Admitting: Internal Medicine

## 2011-06-28 ENCOUNTER — Encounter: Payer: Self-pay | Admitting: Internal Medicine

## 2011-06-28 DIAGNOSIS — R319 Hematuria, unspecified: Secondary | ICD-10-CM

## 2011-06-28 DIAGNOSIS — Z0000 Encounter for general adult medical examination without abnormal findings: Secondary | ICD-10-CM | POA: Insufficient documentation

## 2011-06-28 DIAGNOSIS — E039 Hypothyroidism, unspecified: Secondary | ICD-10-CM

## 2011-06-28 DIAGNOSIS — E119 Type 2 diabetes mellitus without complications: Secondary | ICD-10-CM

## 2011-06-28 DIAGNOSIS — E785 Hyperlipidemia, unspecified: Secondary | ICD-10-CM

## 2011-06-28 DIAGNOSIS — I10 Essential (primary) hypertension: Secondary | ICD-10-CM

## 2011-06-28 LAB — POCT URINALYSIS DIPSTICK
Glucose, UA: NEGATIVE
Ketones, UA: NEGATIVE
Nitrite, UA: NEGATIVE
Protein, UA: 30
Urobilinogen, UA: 0.2
pH, UA: 6.5

## 2011-06-28 LAB — BASIC METABOLIC PANEL
BUN: 13 mg/dL (ref 6–23)
CO2: 29 mEq/L (ref 19–32)
Chloride: 109 mEq/L (ref 96–112)
Creatinine, Ser: 1.1 mg/dL (ref 0.4–1.5)
Glucose, Bld: 111 mg/dL — ABNORMAL HIGH (ref 70–99)
Sodium: 145 mEq/L (ref 135–145)

## 2011-06-28 LAB — PSA: PSA: 1.41 ng/mL (ref 0.10–4.00)

## 2011-06-28 LAB — CBC WITH DIFFERENTIAL/PLATELET
Basophils Relative: 1.1 % (ref 0.0–3.0)
Eosinophils Absolute: 0.3 10*3/uL (ref 0.0–0.7)
Hemoglobin: 16.1 g/dL (ref 13.0–17.0)
Monocytes Relative: 6.7 % (ref 3.0–12.0)
Neutro Abs: 6.9 10*3/uL (ref 1.4–7.7)
WBC: 10.7 10*3/uL — ABNORMAL HIGH (ref 4.5–10.5)

## 2011-06-28 LAB — LIPID PANEL
HDL: 36.6 mg/dL — ABNORMAL LOW (ref >39.00)
VLDL: 31 mg/dL (ref 0.0–40.0)

## 2011-06-28 LAB — TSH: TSH: 4.65 u[IU]/mL (ref 0.35–5.50)

## 2011-06-28 NOTE — Assessment & Plan Note (Signed)
Repeat labs:

## 2011-06-28 NOTE — Assessment & Plan Note (Signed)
Labs

## 2011-06-28 NOTE — Assessment & Plan Note (Addendum)
Td 2005 flu shot --declined,  explained benefits  12-2003: Cscope , TICs, hyperplastic polyps---next 2013 H/p elevated PSA, had a  urology evaluation 2011, patient reports that a repeat PSA was normal, so his prostate biopsy was canceled he was found to have microhematuria, a renal ultrasound was negative------> will get labs (PSA, urine)  CV RF: I discussed the patient and that he is a heavy smoker, has diabetes, high cholesterol and hypertension. These puts him at  a high risk for heart disease, strokes, kidney failure.  The best thing he can do is to become more physical active, quit tobacco; we need to work together on keeping his diabetes cholesterol and BP under control.  Labs

## 2011-06-28 NOTE — Assessment & Plan Note (Addendum)
Seems well controlled at home, DBP today slt elevated

## 2011-06-28 NOTE — Progress Notes (Signed)
  Subjective:    Patient ID: Alex Phillips, male    DOB: 1960-11-26, 51 y.o.   MRN: [id number]  HPI CPX Feels well in general.  good medication compliance w/all med amb BPs wnl at home Still smokes, no recent attempt to quit  Past Medical History: Hypertension AODM dx 11-09 A1C 6.2 Hypothyroidism Hyperlipidemia DJD-- L knee  Bell's palsy h/o elevated WBC-- saw Dr Lambert Mody : likely benign, monitor EKG abnormal, stress test (-) 12-2007 PSA elevated 2010, saw urology, PSA was rechecked and found to be normal. 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  Respiratory: Negative for shortness of breath.        Had a resp illness last week, improved  Cardiovascular: Negative for chest pain and leg swelling.  Gastrointestinal: Negative for abdominal pain and blood in stool.  Genitourinary: Negative for dysuria, hematuria and difficulty urinating.  Psychiatric/Behavioral:       No depression or anxiety        Objective:   Physical Exam  Constitutional: He is oriented to person, place, and time. He appears well-developed and well-nourished.  HENT:  Head: Normocephalic and atraumatic.       L TM slt bulge, no d/c or red. R TM normal  Neck: No thyromegaly present.  Cardiovascular: Normal rate and regular rhythm.   Pulmonary/Chest: Effort normal. No respiratory distress. He has no wheezes. He has no rales.       Few ronchi w/ cough, no wheezing   Abdominal: Soft. Bowel sounds are normal.  He exhibits no distension. There is no tenderness. There is no rebound and no guarding.  Genitourinary: Rectum normal and prostate normal.  Musculoskeletal: He exhibits no edema.  Lymphadenopathy:    He has no cervical adenopathy.  Neurological: He is alert and oriented to person, place, and time.  Psychiatric: He has a normal mood and affect. His behavior is normal. Judgment and thought content normal.       Assessment & Plan:  Had "the flu" last week, doing better, L TM slt bulde, few ronchi on exam, rec to call if not 100% better soon.

## 2011-06-29 LAB — CULTURE, URINE COMPREHENSIVE
Colony Count: NO GROWTH
Organism ID, Bacteria: NO GROWTH

## 2011-07-03 ENCOUNTER — Encounter: Payer: Self-pay | Admitting: Internal Medicine

## 2011-07-15 ENCOUNTER — Other Ambulatory Visit: Payer: Self-pay | Admitting: Internal Medicine

## 2011-07-17 NOTE — Telephone Encounter (Signed)
Done

## 2011-08-22 ENCOUNTER — Other Ambulatory Visit: Payer: Self-pay | Admitting: Internal Medicine

## 2011-09-26 ENCOUNTER — Other Ambulatory Visit: Payer: Self-pay | Admitting: Internal Medicine

## 2011-09-26 NOTE — Telephone Encounter (Signed)
Refill done.  

## 2011-10-19 ENCOUNTER — Other Ambulatory Visit: Payer: Self-pay | Admitting: Internal Medicine

## 2011-10-20 NOTE — Telephone Encounter (Signed)
Refill done.  

## 2011-11-21 IMAGING — CR DG ANKLE 2V *L*
2 series · 2 of 2 positions shown · non-contrast
Comparison: None.

CLINICAL DATA: Sprained ankle on [REDACTED] with pain and swelling
laterally

LEFT ANKLE - 2 VIEW

[view not recorded (1 of 2)]
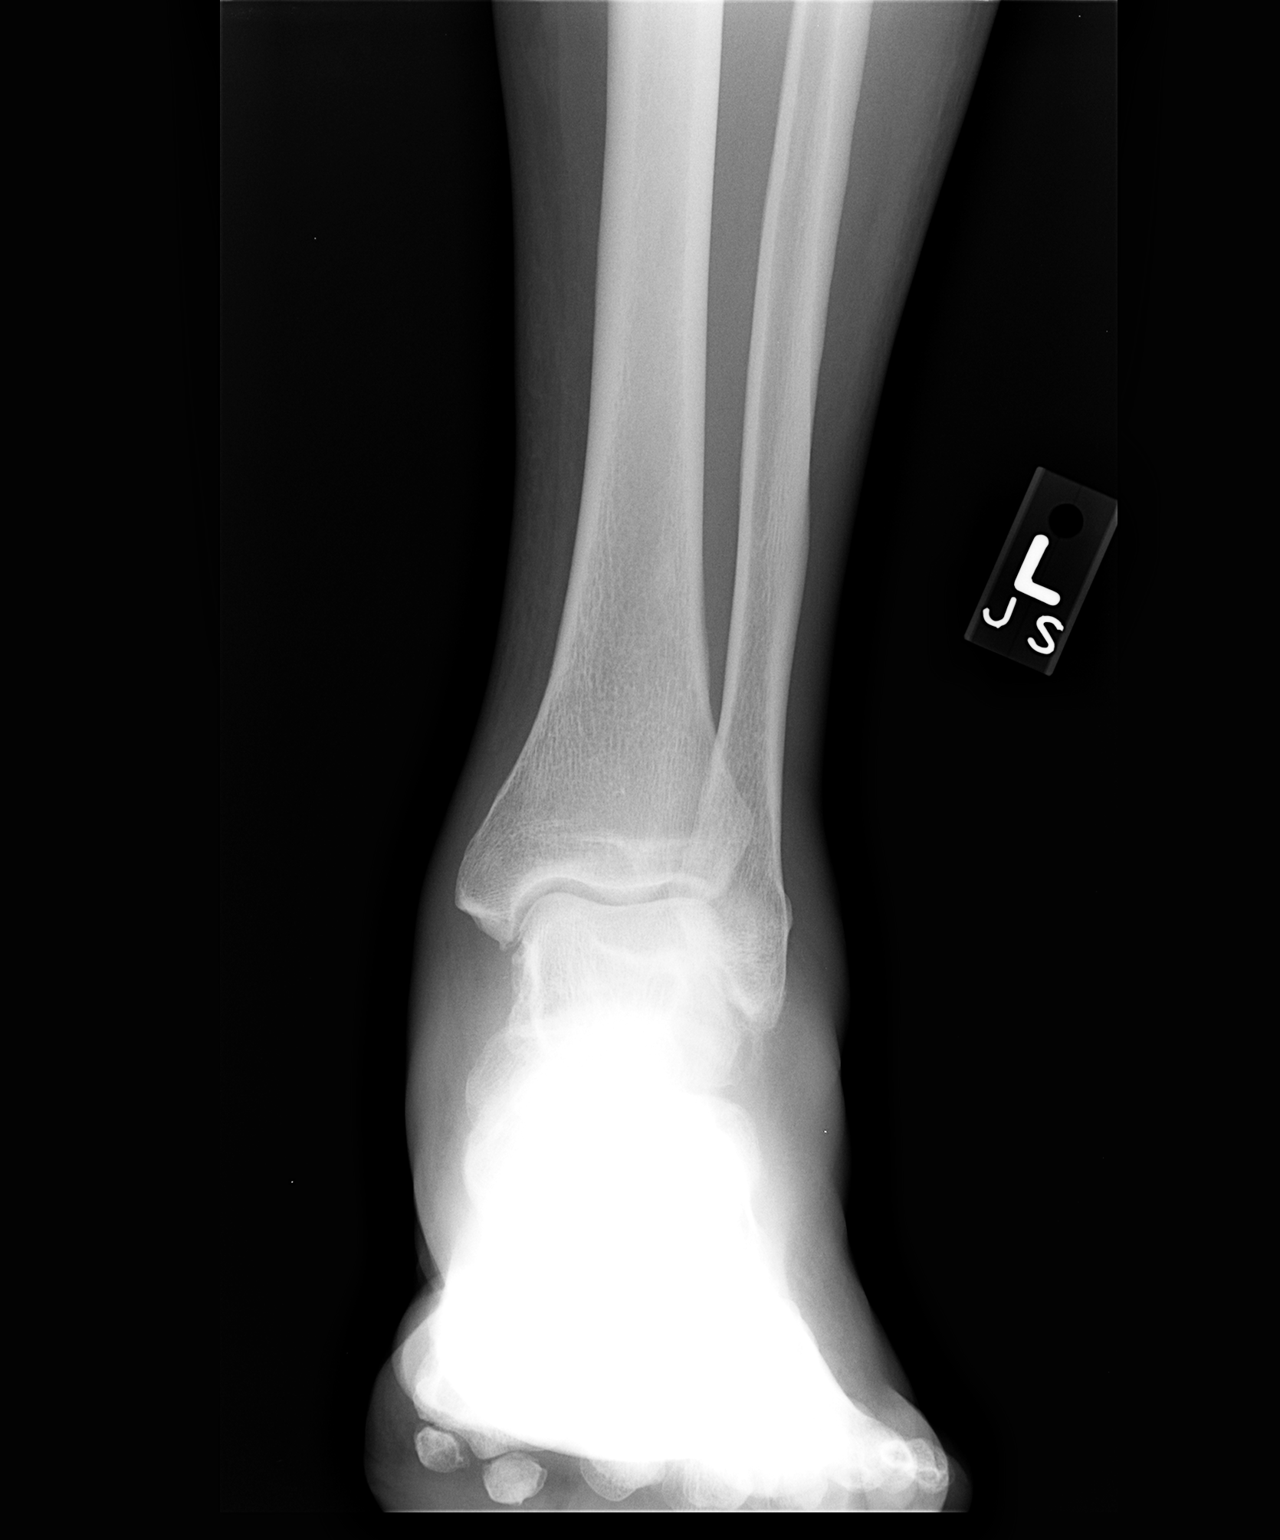

[view not recorded (2 of 2)]
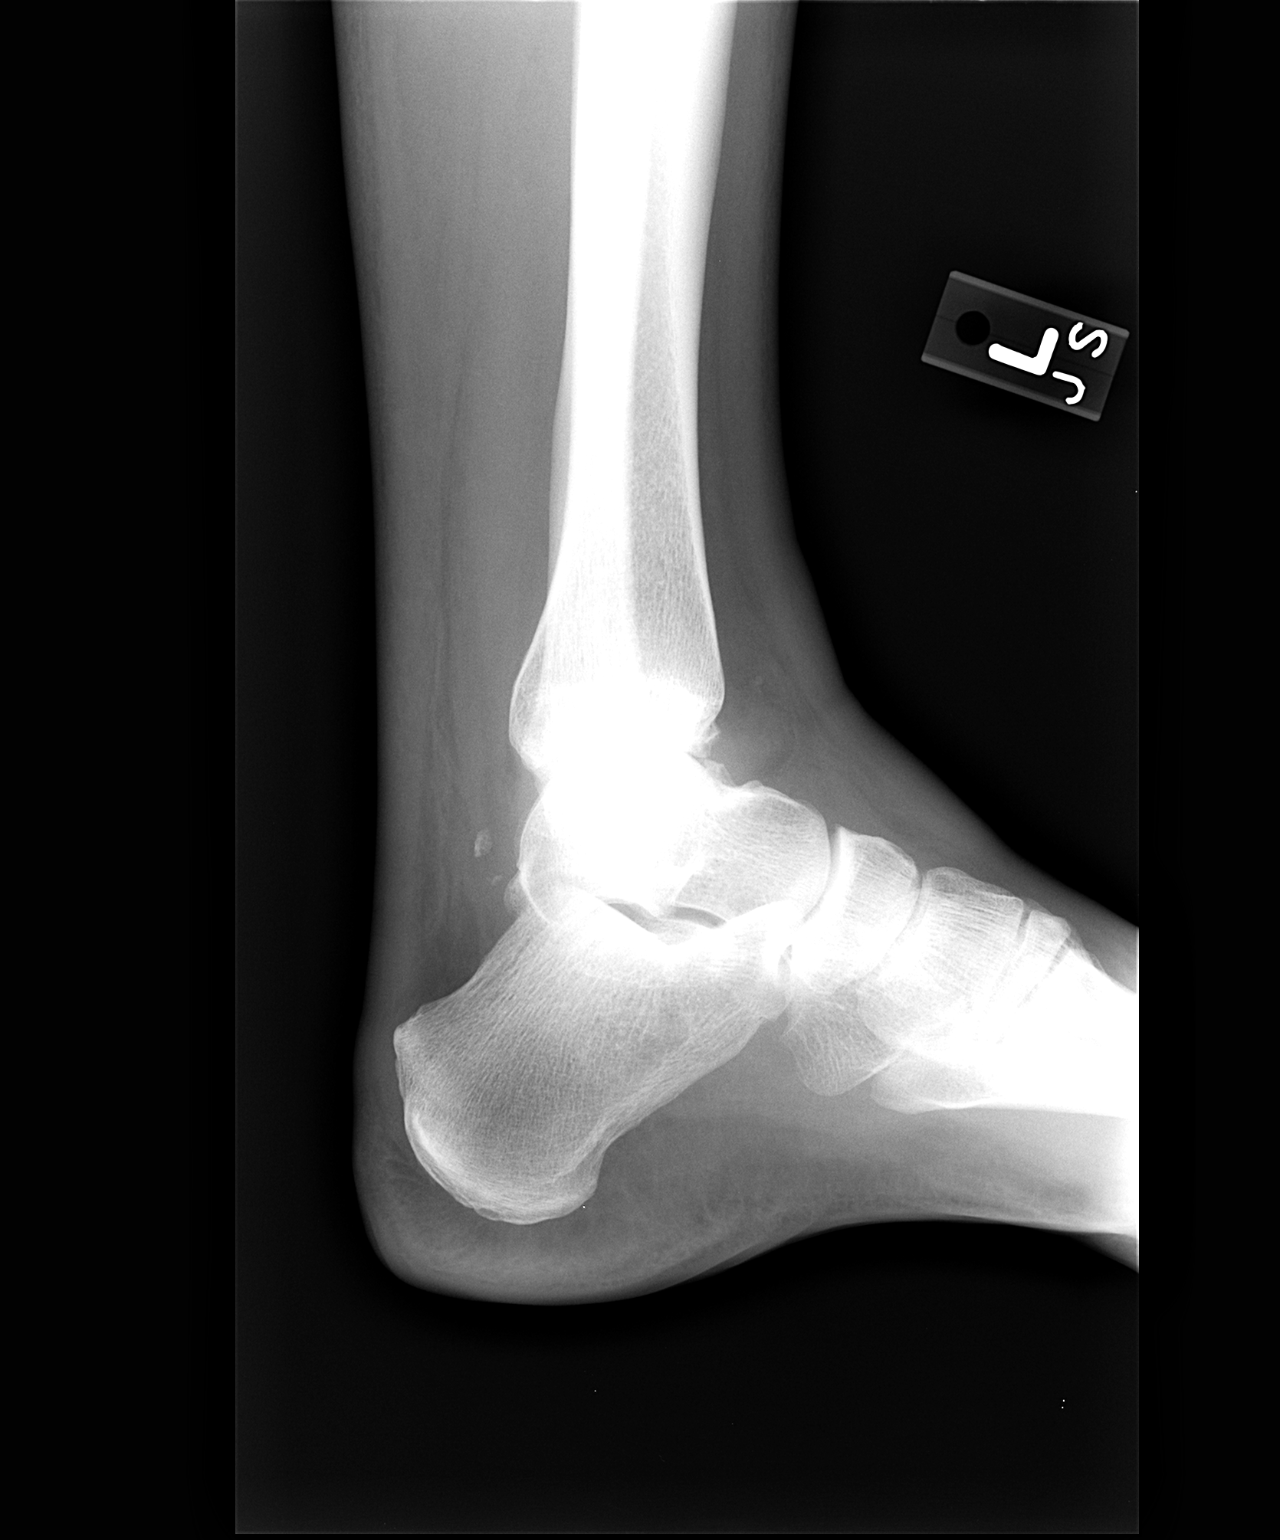

[2 of 2 positions shown; findings below may reference images not displayed]

FINDINGS: There are small bone fragments two of which are
posteriorly positioned posterior to the talus and another along the
anterior talus just above the ankle joint.  These may represent
small avulsion fracture fragments, and there may be a small ankle
joint effusion present.  Alignment is normal.
IMPRESSION: Suspect small avulsion fracture fragments around the ankle joint
with probable small ankle joint effusion.

## 2011-12-26 ENCOUNTER — Encounter: Payer: Self-pay | Admitting: Internal Medicine

## 2011-12-26 ENCOUNTER — Ambulatory Visit (INDEPENDENT_AMBULATORY_CARE_PROVIDER_SITE_OTHER): Payer: 59 | Admitting: Internal Medicine

## 2011-12-26 VITALS — BP 122/84 | HR 64 | Temp 97.8°F | Wt 210.0 lb

## 2011-12-26 DIAGNOSIS — E119 Type 2 diabetes mellitus without complications: Secondary | ICD-10-CM

## 2011-12-26 DIAGNOSIS — I10 Essential (primary) hypertension: Secondary | ICD-10-CM

## 2011-12-26 DIAGNOSIS — E785 Hyperlipidemia, unspecified: Secondary | ICD-10-CM

## 2011-12-26 DIAGNOSIS — E039 Hypothyroidism, unspecified: Secondary | ICD-10-CM

## 2011-12-26 LAB — BASIC METABOLIC PANEL
CO2: 29 mEq/L (ref 19–32)
Calcium: 9.4 mg/dL (ref 8.4–10.5)
Creatinine, Ser: 1.2 mg/dL (ref 0.4–1.5)
Sodium: 139 mEq/L (ref 135–145)

## 2011-12-26 LAB — HEMOGLOBIN A1C: Hgb A1c MFr Bld: 6.6 % — ABNORMAL HIGH (ref 4.6–6.5)

## 2011-12-26 LAB — TSH: TSH: 1.72 u[IU]/mL (ref 0.35–5.50)

## 2011-12-26 NOTE — Progress Notes (Signed)
  Subjective:    Patient ID: Alex Phillips, male    DOB: June 12, 1961, 51 y.o.   MRN: [id number]  HPI ROV Doing well Good med compliance   Past Medical History:  Hypertension  AODM dx 11-09 A1C 6.2  Hypothyroidism  Hyperlipidemia  DJD-- L knee  Bell's palsy  h/o elevated WBC-- saw Dr Lambert Mody : likely benign, monitor  EKG abnormal, stress test (-) 12-2007  PSA elevated 2010, saw urology, PSA was rechecked and found to be normal. 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 No apparent distress.  Lungs -- normal respiratory effort, no intercostal retractions, no accessory muscle use, and normal breath sounds.   Heart-- normal rate, regular rhythm, no murmur, and no gallop.   Extremities-- no pretibial edema bilaterally  Neurologic-- alert & oriented X3 and strength normal in all extremities. Psych-- Cognition and judgment appear intact. Alert and cooperative with normal attention span and concentration.  not anxious appearing and not depressed appearing.        Assessment & Plan:

## 2011-12-26 NOTE — Assessment & Plan Note (Signed)
Good amb BPs, check a BMP

## 2011-12-26 NOTE — Assessment & Plan Note (Signed)
Well-controlled, no change 

## 2011-12-26 NOTE — Assessment & Plan Note (Signed)
Discussed diet-exercise Labs

## 2011-12-26 NOTE — Assessment & Plan Note (Signed)
Good med compliance, check a TSH 

## 2012-01-01 ENCOUNTER — Encounter: Payer: Self-pay | Admitting: *Deleted

## 2012-02-02 ENCOUNTER — Other Ambulatory Visit: Payer: Self-pay | Admitting: Internal Medicine

## 2012-02-09 ENCOUNTER — Encounter: Payer: Self-pay | Admitting: General Practice

## 2012-03-09 ENCOUNTER — Other Ambulatory Visit: Payer: Self-pay | Admitting: Internal Medicine

## 2012-03-11 NOTE — Telephone Encounter (Signed)
Refill done.  

## 2012-03-15 ENCOUNTER — Other Ambulatory Visit: Payer: Self-pay | Admitting: Internal Medicine

## 2012-03-15 NOTE — Telephone Encounter (Signed)
Refill done.  

## 2012-03-19 ENCOUNTER — Other Ambulatory Visit: Payer: Self-pay

## 2012-03-19 MED ORDER — AMLODIPINE BESYLATE 10 MG PO TABS: 10.0000 mg | ORAL_TABLET | Freq: Every day | ORAL | Status: DC

## 2012-03-19 MED ORDER — LEVOTHYROXINE SODIUM 75 MCG PO TABS: 75.0000 ug | ORAL_TABLET | Freq: Every day | ORAL | Status: DC

## 2012-03-19 MED ORDER — LOSARTAN POTASSIUM-HCTZ 100-25 MG PO TABS: 1.0000 | ORAL_TABLET | Freq: Every day | ORAL | Status: DC

## 2012-04-24 ENCOUNTER — Telehealth: Payer: Self-pay | Admitting: Internal Medicine

## 2012-04-24 MED ORDER — LEVOTHYROXINE SODIUM 75 MCG PO TABS: 75.0000 ug | ORAL_TABLET | Freq: Every day | ORAL | Status: DC

## 2012-04-24 MED ORDER — AMLODIPINE BESYLATE 10 MG PO TABS: 10.0000 mg | ORAL_TABLET | Freq: Every day | ORAL | Status: DC

## 2012-04-24 MED ORDER — LOSARTAN POTASSIUM-HCTZ 100-25 MG PO TABS: 1.0000 | ORAL_TABLET | Freq: Every day | ORAL | Status: DC

## 2012-04-24 NOTE — Telephone Encounter (Signed)
Refill done.  

## 2012-04-24 NOTE — Telephone Encounter (Signed)
Amlodipine besylate 10mg  tablets. TK 1 T PO once daily. Qty 90. Last fill 04-20-12

## 2012-04-24 NOTE — Telephone Encounter (Signed)
Refill: Losartan/hctz 100/25 mg tablets. TK 1 T PO once daily. Qty 90. Last fill 04-20-12  Levothyroxine 0.075 mg tabs. TK 1 T PO once daily. Qty 150. Last fill 04-20-12

## 2012-06-27 ENCOUNTER — Encounter: Payer: Self-pay | Admitting: Internal Medicine

## 2012-06-27 ENCOUNTER — Ambulatory Visit (INDEPENDENT_AMBULATORY_CARE_PROVIDER_SITE_OTHER): Payer: 59 | Admitting: Internal Medicine

## 2012-06-27 VITALS — BP 140/88 | HR 74 | Temp 97.9°F | Ht 72.5 in | Wt 214.0 lb

## 2012-06-27 DIAGNOSIS — I10 Essential (primary) hypertension: Secondary | ICD-10-CM

## 2012-06-27 DIAGNOSIS — Z0000 Encounter for general adult medical examination without abnormal findings: Secondary | ICD-10-CM

## 2012-06-27 DIAGNOSIS — E039 Hypothyroidism, unspecified: Secondary | ICD-10-CM

## 2012-06-27 DIAGNOSIS — E119 Type 2 diabetes mellitus without complications: Secondary | ICD-10-CM

## 2012-06-27 DIAGNOSIS — E785 Hyperlipidemia, unspecified: Secondary | ICD-10-CM

## 2012-06-27 DIAGNOSIS — Z23 Encounter for immunization: Secondary | ICD-10-CM

## 2012-06-27 DIAGNOSIS — F17200 Nicotine dependence, unspecified, uncomplicated: Secondary | ICD-10-CM

## 2012-06-27 LAB — LIPID PANEL
Cholesterol: 152 mg/dL (ref 0–200)
VLDL: 22.2 mg/dL (ref 0.0–40.0)

## 2012-06-27 LAB — CBC WITH DIFFERENTIAL/PLATELET
Eosinophils Absolute: 0.5 10*3/uL (ref 0.0–0.7)
HCT: 46.7 % (ref 39.0–52.0)
Lymphs Abs: 2.4 10*3/uL (ref 0.7–4.0)
MCHC: 34.8 g/dL (ref 30.0–36.0)
MCV: 95.7 fl (ref 78.0–100.0)
Monocytes Absolute: 0.6 10*3/uL (ref 0.1–1.0)
Neutrophils Relative %: 65.3 % (ref 43.0–77.0)
Platelets: 167 10*3/uL (ref 150.0–400.0)
RDW: 13.2 % (ref 11.5–14.6)

## 2012-06-27 LAB — HEMOGLOBIN A1C: Hgb A1c MFr Bld: 6.8 % — ABNORMAL HIGH (ref 4.6–6.5)

## 2012-06-27 LAB — BASIC METABOLIC PANEL
Creatinine, Ser: 1.2 mg/dL (ref 0.4–1.5)
Sodium: 139 mEq/L (ref 135–145)

## 2012-06-27 LAB — TSH: TSH: 3.36 u[IU]/mL (ref 0.35–5.50)

## 2012-06-27 LAB — AST: AST: 20 U/L (ref 0–37)

## 2012-06-27 MED ORDER — AMLODIPINE BESYLATE 10 MG PO TABS: 10.0000 mg | ORAL_TABLET | Freq: Every day | ORAL | Status: DC

## 2012-06-27 MED ORDER — LEVOTHYROXINE SODIUM 75 MCG PO TABS: 75.0000 ug | ORAL_TABLET | Freq: Every day | ORAL | Status: DC

## 2012-06-27 MED ORDER — LOSARTAN POTASSIUM-HCTZ 100-25 MG PO TABS: 1.0000 | ORAL_TABLET | Freq: Every day | ORAL | Status: DC

## 2012-06-27 MED ORDER — LABETALOL HCL 300 MG PO TABS: 300.0000 mg | ORAL_TABLET | Freq: Two times a day (BID) | ORAL | Status: DC

## 2012-06-27 NOTE — Assessment & Plan Note (Signed)
Diet and exercise encouraged 

## 2012-06-27 NOTE — Assessment & Plan Note (Addendum)
Well-controlled? BP slightly elevated today, no ambulatory BPs. Plan: Recommend self BP check and adjust medications --->  increase labetalol dose

## 2012-06-27 NOTE — Assessment & Plan Note (Signed)
Due for labs and refills.

## 2012-06-27 NOTE — Assessment & Plan Note (Signed)
Td 2005 flu shot --declined,  explained benefits Pneumonia shot discussed Interested in the shingles shot, recommend to check with his insurance to be sure is covered .  12-2003: Cscope , TICs, hyperplastic polyps---> is unable to schedule a colonoscopy in 2013, mostly due to to his scheduled. Recommend to call GI  H/o elevated PSA, had a  urology evaluation 2011, he is asx, DRE normal, check a PSA   I again talked with the patient about his CV RF , recommend to work on quitting tobacco, exercise daily and I can help him with diabetes, HTN & cholesterol  control.  Labs

## 2012-06-27 NOTE — Patient Instructions (Signed)
Check the  blood pressure 2 or 3 times a week, be sure it is between 110/60 and 135/85. If it is consistently higher or lower, let me know

## 2012-06-27 NOTE — Assessment & Plan Note (Signed)
Risks discussed, information about quitting provided

## 2012-06-27 NOTE — Progress Notes (Signed)
  Subjective:    Patient ID: Alex Phillips, male    DOB: Sep 05, 1960, 52 y.o.   MRN: [id number]  HPI  CPX  Past Medical History  Diagnosis Date  . Hypertension   . Diabetes mellitus 11/09    A1c- 6.2  . Hypothyroidism   . Hyperlipidemia   . DJD (degenerative joint disease)     l knee  . Bell's palsy   . Elevated WBC count     saw Dr.Enenever; likely benign, monitor  . EKG, abnormal 12-2007    Stress test (-)   . Elevated PSA 2010    saw urology, PSA was reched and found to be normal. Bx was canceled, did get a u/s d/t michrohematuris- normal   Past Surgical History  Procedure Date  . Tonsillectomy and adenoidectomy   . Tympanostomy tube placement     B as a child  . Vasectomy 02/2009   Family History  Problem Relation Age of Onset  . 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 No dysuria or gross hematuria. Denies anxiety or depression       Objective:   Physical Exam  General -- alert, well-developed, (+) Central obesity.   Neck --no thyromegaly Lungs -- normal respiratory effort, no intercostal retractions, no accessory muscle use, and normal breath sounds.   Heart-- normal rate, regular rhythm, no murmur, and no gallop.   Abdomen--soft, non-tender, no distention, no masses, no HSM, no guarding, and no rigidity.   Extremities-- no pretibial edema bilaterally Rectal-- No external abnormalities noted. Normal sphincter tone. No rectal masses or tenderness. Brown stool  Prostate:  Prostate gland firm and smooth, no enlargement, nodularity, tenderness, mass, asymmetry or induration. Neurologic-- alert & oriented X3 and strength normal in all  extremities. Psych-- Cognition and judgment appear intact. Alert and cooperative with normal attention span and concentration.  not anxious appearing and not depressed appearing.      Assessment & Plan:  In addition to a complete physical exam we discussed hypertension, diabetes and assessed  hypothyroidism

## 2012-06-28 ENCOUNTER — Encounter: Payer: Self-pay | Admitting: *Deleted

## 2012-10-03 ENCOUNTER — Encounter: Payer: Self-pay | Admitting: Internal Medicine

## 2012-10-25 ENCOUNTER — Ambulatory Visit (INDEPENDENT_AMBULATORY_CARE_PROVIDER_SITE_OTHER): Payer: 59 | Admitting: Internal Medicine

## 2012-10-25 VITALS — BP 128/82 | HR 63 | Wt 212.0 lb

## 2012-10-25 DIAGNOSIS — E119 Type 2 diabetes mellitus without complications: Secondary | ICD-10-CM

## 2012-10-25 DIAGNOSIS — R5381 Other malaise: Secondary | ICD-10-CM

## 2012-10-25 DIAGNOSIS — E039 Hypothyroidism, unspecified: Secondary | ICD-10-CM

## 2012-10-25 DIAGNOSIS — I10 Essential (primary) hypertension: Secondary | ICD-10-CM

## 2012-10-25 DIAGNOSIS — F17200 Nicotine dependence, unspecified, uncomplicated: Secondary | ICD-10-CM

## 2012-10-25 MED ORDER — HYDROCORTISONE 2.5 % EX CREA: TOPICAL_CREAM | Freq: Two times a day (BID) | CUTANEOUS | Status: DC

## 2012-10-25 NOTE — Patient Instructions (Signed)
Check the  blood pressure 2 or 3 times a week, be sure it is between 110/60 and 140/85. If it is consistently higher or lower, let me know Use the cream twice a day, claritin 10 mg OTC 1 a day Call if no better in few days  Next visit 4 months   Poison [facility] ivy is a inflammation of the skin (contact dermatitis) caused by touching the allergens on the leaves of the ivy plant following previous exposure to the plant. The rash usually appears 48 hours after exposure. The rash is usually bumps (papules) or blisters (vesicles) in a linear pattern. Depending on your own sensitivity, the rash may simply cause redness and itching, or it may also progress to blisters which may break open. These must be well cared for to prevent secondary bacterial (germ) infection, followed by scarring. Keep any open areas dry, clean, dressed, and covered with an antibacterial ointment if needed. The eyes may also get puffy. The puffiness is worst in the morning and gets better as the day progresses. This dermatitis usually heals without scarring, within 2 to 3 weeks without treatment. HOME CARE INSTRUCTIONS  Thoroughly wash with soap and water as soon as you have been exposed to poison ivy. You have about one half hour to remove the plant resin before it will cause the rash. This washing will destroy the oil or antigen on the skin that is causing, or will cause, the rash. Be sure to wash under your fingernails as any plant resin there will continue to spread the rash. Do not rub skin vigorously when washing affected area. Poison ivy cannot spread if no oil from the plant remains on your body. A rash that has progressed to weeping sores will not spread the rash unless you have not washed thoroughly. It is also important to wash any clothes you have been wearing as these may carry active allergens. The rash will return if you wear the unwashed clothing, even several days later. Avoidance of the plant in the future is the best  measure. Poison ivy plant can be recognized by the number of leaves. Generally, poison ivy has three leaves with flowering branches on a single stem. Diphenhydramine may be purchased over the counter and used as needed for itching. Do not drive with this medication if it makes you drowsy.Ask your caregiver about medication for children. SEEK MEDICAL CARE IF:  Open sores develop.  Redness spreads beyond area of rash.  You notice purulent (pus-like) discharge.  You have increased pain.  Other signs of infection develop (such as fever). Document Released: 06/09/2000 Document Revised: 09/04/2011 Document Reviewed: 04/28/2009 [facility] Patient Information 2013 [place], [place].

## 2012-10-25 NOTE — Assessment & Plan Note (Signed)
Counseled again 

## 2012-10-25 NOTE — Progress Notes (Signed)
  Subjective:    Patient ID: Alex Phillips, male    DOB: Feb 01, 1961, 52 y.o.   MRN: [id number]  HPI Routine office visit History of diabetes, has been out of town several months, diet has not been very good. Poison ivy, worked in the yard during the weekend, shortly after developed a very itchy rash, using OTC steroids. Check testosterone? Reports some decrease in his sex drive and energy. Hypertension, high cholesterol, hypothyroidism: Good medication compliance, not ambulatory BPs.  Past Medical History  Diagnosis Date  . Hypertension   . Diabetes mellitus 11/09    A1c- 6.2  . Hypothyroidism   . Hyperlipidemia   . DJD (degenerative joint disease)     l knee  . Bell's palsy   . Elevated WBC count     saw Dr.Enenever; likely benign, monitor  . EKG, abnormal 12-2007    Stress test (-)   . Elevated PSA 2010    saw urology, PSA was reched and found to be normal. Bx was canceled, did get a u/s d/t michrohematuris- normal   Past Surgical History  Procedure Laterality Date  . Tonsillectomy and adenoidectomy    . Tympanostomy tube placement      B as a child  . Vasectomy  02/2009   History   Social History  . Marital Status: Married    Spouse Name: N/A    Number of Children: 2  . Years of Education: N/A   Occupational History  . Designer, industrial/product     Social History Main Topics  . Smoking status: Current Every Day Smoker -- 1.00 packs/day  . Smokeless tobacco: Never Used  . Alcohol Use: Yes     Comment: rare  . Drug Use: No  . Sexually Active: Not on file   Other Topics Concern  . Not on file   Social History Narrative   2 step kids   Lost father 75-12, takes care of mom (lives in [place])     baseball player from elementary to college     Review of Systems Reportedly, snores very rarely. Does not feel sleepy a regular basis, no sleepy if he is engaged. No chest pain, shortness of breath. No chronic cough, did have some cough in the last 10 days, thinks  related to allergies. No nausea, vomiting, diarrhea.    Objective:   Physical Exam  Skin:       General -- alert, well-developed, No apparent distress .    Lungs -- normal respiratory effort, no intercostal retractions, no accessory muscle use, and normal breath sounds.   Heart-- normal rate, regular rhythm, no murmur, and no gallop.   Abdomen--soft, non-tender, no distention, no masses, no HSM, no guarding, and no rigidity.   Extremities-- no pretibial edema bilaterally Psych-- Cognition and judgment appear intact. Alert and cooperative with normal attention span and concentration.  not anxious appearing and not depressed appearing.       Assessment & Plan:  [facility], will treat w/ topical Hydrocortisone, claritin, see instructions.avoid oral steroids for now As he has hyperglycemia Fatigue, no sx of OSA, check a testosterone level per pt request

## 2012-10-25 NOTE — Assessment & Plan Note (Signed)
Check a TSH C/o some fatigue, will set a TSH goal of < 1.0

## 2012-10-25 NOTE — Assessment & Plan Note (Signed)
a1c gradually increasing, recently life style has not been good, suspect a1c may be higher Labs Diet-exercise discussed

## 2012-10-25 NOTE — Assessment & Plan Note (Signed)
Well controlled here, no amb BPs, see instructions

## 2012-10-26 ENCOUNTER — Encounter: Payer: Self-pay | Admitting: Internal Medicine

## 2012-10-28 LAB — TESTOSTERONE, FREE, TOTAL, SHBG: Testosterone-% Free: 1.5 % — ABNORMAL LOW (ref 1.6–2.9)

## 2012-10-31 NOTE — Addendum Note (Signed)
Addended by: Edwena Felty T on: 10/31/2012 01:27 PM   Modules accepted: Orders

## 2012-11-01 ENCOUNTER — Other Ambulatory Visit: Payer: Self-pay | Admitting: General Practice

## 2012-11-01 ENCOUNTER — Other Ambulatory Visit: Payer: Self-pay | Admitting: Internal Medicine

## 2012-11-01 DIAGNOSIS — E039 Hypothyroidism, unspecified: Secondary | ICD-10-CM

## 2012-11-01 DIAGNOSIS — R7989 Other specified abnormal findings of blood chemistry: Secondary | ICD-10-CM

## 2012-11-01 MED ORDER — LEVOTHYROXINE SODIUM 100 MCG PO TABS: 100.0000 ug | ORAL_TABLET | Freq: Every day | ORAL | Status: DC

## 2012-11-23 ENCOUNTER — Other Ambulatory Visit: Payer: Self-pay | Admitting: Internal Medicine

## 2012-11-25 NOTE — Telephone Encounter (Signed)
Refill done.  

## 2012-12-05 ENCOUNTER — Other Ambulatory Visit: Payer: Self-pay | Admitting: *Deleted

## 2012-12-05 MED ORDER — LABETALOL HCL 300 MG PO TABS: 300.0000 mg | ORAL_TABLET | Freq: Two times a day (BID) | ORAL | Status: DC

## 2012-12-05 NOTE — Telephone Encounter (Signed)
Rx sent 

## 2012-12-17 ENCOUNTER — Other Ambulatory Visit (INDEPENDENT_AMBULATORY_CARE_PROVIDER_SITE_OTHER): Payer: 59

## 2012-12-17 DIAGNOSIS — R5383 Other fatigue: Secondary | ICD-10-CM

## 2012-12-17 DIAGNOSIS — R7989 Other specified abnormal findings of blood chemistry: Secondary | ICD-10-CM

## 2012-12-17 DIAGNOSIS — E291 Testicular hypofunction: Secondary | ICD-10-CM

## 2012-12-17 DIAGNOSIS — E039 Hypothyroidism, unspecified: Secondary | ICD-10-CM

## 2012-12-17 LAB — TSH: TSH: 1.31 u[IU]/mL (ref 0.35–5.50)

## 2012-12-18 LAB — TESTOSTERONE, FREE, TOTAL, SHBG
Testosterone, Free: 50.1 pg/mL (ref 47.0–244.0)
Testosterone-% Free: 1.5 % — ABNORMAL LOW (ref 1.6–2.9)

## 2013-01-24 ENCOUNTER — Other Ambulatory Visit: Payer: Self-pay | Admitting: Internal Medicine

## 2013-01-24 NOTE — Telephone Encounter (Signed)
Refill done.  

## 2013-02-20 ENCOUNTER — Other Ambulatory Visit: Payer: Self-pay | Admitting: Internal Medicine

## 2013-02-20 NOTE — Telephone Encounter (Signed)
rx refilled per protocol. DJR  

## 2013-02-21 ENCOUNTER — Encounter: Payer: Self-pay | Admitting: Internal Medicine

## 2013-02-21 ENCOUNTER — Ambulatory Visit (INDEPENDENT_AMBULATORY_CARE_PROVIDER_SITE_OTHER): Payer: 59 | Admitting: Internal Medicine

## 2013-02-21 VITALS — BP 126/74 | HR 60 | Temp 98.1°F | Wt 213.0 lb

## 2013-02-21 DIAGNOSIS — I10 Essential (primary) hypertension: Secondary | ICD-10-CM

## 2013-02-21 DIAGNOSIS — E119 Type 2 diabetes mellitus without complications: Secondary | ICD-10-CM

## 2013-02-21 DIAGNOSIS — E039 Hypothyroidism, unspecified: Secondary | ICD-10-CM

## 2013-02-21 DIAGNOSIS — E785 Hyperlipidemia, unspecified: Secondary | ICD-10-CM

## 2013-02-21 DIAGNOSIS — R5383 Other fatigue: Secondary | ICD-10-CM

## 2013-02-21 DIAGNOSIS — R5381 Other malaise: Secondary | ICD-10-CM

## 2013-02-21 LAB — BASIC METABOLIC PANEL
BUN: 9 mg/dL (ref 6–23)
Chloride: 103 mEq/L (ref 96–112)
Creatinine, Ser: 1.4 mg/dL (ref 0.4–1.5)
GFR: 59.05 mL/min — ABNORMAL LOW (ref >60.00)
Potassium: 3.6 mEq/L (ref 3.5–5.1)

## 2013-02-21 LAB — HEMOGLOBIN A1C: Hgb A1c MFr Bld: 6.8 % — ABNORMAL HIGH (ref 4.6–6.5)

## 2013-02-21 LAB — ALT: ALT: 17 U/L (ref 0–53)

## 2013-02-21 LAB — AST: AST: 22 U/L (ref 0–37)

## 2013-02-21 NOTE — Assessment & Plan Note (Signed)
Complained of fatigue in the last time he was here, no sleep apnea symptoms. Hypothyroidism is well-controlled. His testosterone level was in the low side of normal. At this point he thinks is fatigued b/c is extremely busy. Plan: Monitor symptoms for now

## 2013-02-21 NOTE — Assessment & Plan Note (Signed)
Improving life style ! Feet exam neg today rec eye exam Labs

## 2013-02-21 NOTE — Assessment & Plan Note (Signed)
Good med compliance rec amb BP checking Labs

## 2013-02-21 NOTE — Assessment & Plan Note (Signed)
Check LFTs 

## 2013-02-21 NOTE — Patient Instructions (Addendum)
See the eye doctor at least every year due to history of diabetes  -- Check the  blood pressure 2 or 3 times a week, be sure it is between 110/60 and 140/85. If it is consistently higher or lower, let me know --- Get your blood work before you leave  Next visit in  5 months for a physical exam, fasting Please make an appointment before you leave the office today (or call few weeks in advance)      Diabetes and Foot Care Diabetes may cause you to have a poor blood supply (circulation) to your legs and feet. Because of this, the skin may be thinner, break easier, and heal more slowly. You also may have nerve damage in your legs and feet causing decreased feeling. You may not notice minor injuries to your feet that could lead to serious problems or infections. Taking care of your feet is one of the most important things you can do for yourself.  HOME CARE INSTRUCTIONS  Do not go barefoot. Bare feet are easily injured.  Check your feet daily for blisters, cuts, and redness.  Wash your feet with warm water (not hot) and mild soap. Pat your feet and between your toes until completely dry.  Apply a moisturizing lotion that does not contain alcohol or petroleum jelly to the dry skin on your feet and to dry brittle toenails. Do not put it between your toes.  Trim your toenails straight across. Do not dig under them or around the cuticle.  Do not cut corns or calluses, or try to remove them with medicine.  Wear clean cotton socks or stockings every day. Make sure they are not too tight. Do not wear knee high stockings since they may decrease blood flow to your legs.  Wear leather shoes that fit properly and have enough cushioning. To break in new shoes, wear them just a few hours a day to avoid injuring your feet.  Wear shoes at all times, even in the house.  Do not cross your legs. This may decrease the blood flow to your feet.  If you find a minor scrape, cut, or break in the skin on your  feet, keep it and the skin around it clean and dry. These areas may be cleansed with mild soap and water. Do not use peroxide, alcohol, iodine or Merthiolate.  When you remove an adhesive bandage, be sure not to harm the skin around it.  If you have a wound, look at it several times a day to make sure it is healing.  Do not use heating pads or hot water bottles. Burns can occur. If you have lost feeling in your feet or legs, you may not know it is happening until it is too late.  Report any cuts, sores or bruises to your caregiver. Do not wait! SEEK MEDICAL CARE IF:   You have an injury that is not healing or you notice redness, numbness, burning, or tingling.  Your feet always feel cold.  You have pain or cramps in your legs and feet. SEEK IMMEDIATE MEDICAL CARE IF:   There is increasing redness, swelling, or increasing pain in the wound.  There is a red line that goes up your leg.  Pus is coming from a wound.  You develop an unexplained oral temperature above 102 F (38.9 C), or as your caregiver suggests.  You notice a bad smell coming from an ulcer or wound. MAKE SURE YOU:   Understand these instructions.  Will watch your condition.  Will get help right away if you are not doing well or get worse. Document Released: 06/09/2000 Document Revised: 09/04/2011 Document Reviewed: 12/16/2008 [facility] Patient Information 2014 [place], [place].

## 2013-02-21 NOTE — Assessment & Plan Note (Addendum)
Last TSH although not < 1.0 was ok, no change

## 2013-02-21 NOTE — Progress Notes (Signed)
  Subjective:    Patient ID: Alex Phillips, male    DOB: 05/28/1961, 52 y.o.   MRN: [id number]  HPI Routine office visit  Fatigue--c/o lack of energy the last time he was here, he continue w/ some fatigue but at this point he thinks is related to taking care of his mom in addition to his job and family Diabetes--diet has improved to some extent, he remains very active with his young family but no routine exercises. Hypertension, good medication compliance, not ambulatory BPs  Past Medical History  Diagnosis Date  . Hypertension   . Diabetes mellitus 11/09    A1c- 6.2  . Hypothyroidism   . Hyperlipidemia   . DJD (degenerative joint disease)     l knee  . Bell's palsy   . Elevated WBC count     saw Dr.Enenever; likely benign, monitor  . EKG, abnormal 12-2007    Stress test (-)   . Elevated PSA 2010    saw urology, PSA was reched and found to be normal. Bx was canceled, did get a u/s d/t michrohematuris- normal   Past Surgical History  Procedure Laterality Date  . Tonsillectomy and adenoidectomy    . Tympanostomy tube placement      B as a child  . Vasectomy  02/2009   History   Social History  . Marital Status: Married    Spouse Name: N/A    Number of Children: 2  . Years of Education: N/A   Occupational History  . Designer, industrial/product     Social History Main Topics  . Smoking status: Current Every Day Smoker -- 1.00 packs/day  . Smokeless tobacco: Never Used  . Alcohol Use: Yes     Comment: rare  . Drug Use: No  . Sexual Activity: Not on file   Other Topics Concern  . Not on file   Social History Narrative   2 step kids   Lost father 64-12, takes care of mom (lives in [place])     baseball player from elementary to college      Review of Systems No chest pain or shortness or breath No nausea, vomiting, diarrhea. Still smoking, clearly told me he's not ready to quit.     Objective:   Physical Exam General -- alert, well-developed, NAD.  Lungs --  normal respiratory effort, no intercostal retractions, no accessory muscle use, and normal breath sounds.  Heart-- normal rate, regular rhythm, no murmur.  DIABETIC FEET EXAM: No lower extremity edema Normal pedal pulses bilaterally Skin and nails are normal without calluses Pinprick examination of the feet normal. Neurologic-- alert & oriented X3. Speech, gait normal.  Psych-- Cognition and judgment appear intact. Alert and cooperative with normal attention span and concentration. not anxious appearing and not depressed appearing.     Assessment & Plan:

## 2013-02-25 ENCOUNTER — Ambulatory Visit: Payer: 59 | Admitting: Internal Medicine

## 2013-04-01 ENCOUNTER — Ambulatory Visit: Payer: 59 | Admitting: Internal Medicine

## 2013-04-07 ENCOUNTER — Other Ambulatory Visit: Payer: Self-pay | Admitting: *Deleted

## 2013-04-07 MED ORDER — LEVOTHYROXINE SODIUM 100 MCG PO TABS: ORAL_TABLET | ORAL | Status: DC

## 2013-04-07 NOTE — Telephone Encounter (Signed)
Levothyroxine refill sent to pharmacy 

## 2013-05-01 ENCOUNTER — Other Ambulatory Visit: Payer: Self-pay

## 2013-05-12 ENCOUNTER — Other Ambulatory Visit: Payer: Self-pay | Admitting: Internal Medicine

## 2013-05-12 NOTE — Telephone Encounter (Signed)
Losartan and amlodipine refilled per protocol

## 2013-07-08 ENCOUNTER — Other Ambulatory Visit: Payer: Self-pay | Admitting: Internal Medicine

## 2013-07-25 ENCOUNTER — Telehealth: Payer: Self-pay

## 2013-07-25 NOTE — Telephone Encounter (Signed)
Medication List and allergies:  Reviewed and updated  90 day supply/mail order: na Local prescriptions: [facility] [address]  Immunizations due: declines flu vaccine  A/P:   No changes to FH, PSH or Personal Hx Tdap--10/2003 CCS--12/2003--due  PSA--06/2012--1.83  To Discuss with Provider: Not at this time

## 2013-07-29 ENCOUNTER — Ambulatory Visit (INDEPENDENT_AMBULATORY_CARE_PROVIDER_SITE_OTHER): Payer: 59 | Admitting: Internal Medicine

## 2013-07-29 ENCOUNTER — Encounter: Payer: Self-pay | Admitting: Internal Medicine

## 2013-07-29 VITALS — BP 122/86 | HR 78 | Temp 97.9°F | Ht 72.2 in | Wt 214.0 lb

## 2013-07-29 DIAGNOSIS — Z0000 Encounter for general adult medical examination without abnormal findings: Secondary | ICD-10-CM

## 2013-07-29 DIAGNOSIS — E119 Type 2 diabetes mellitus without complications: Secondary | ICD-10-CM

## 2013-07-29 LAB — CBC WITH DIFFERENTIAL/PLATELET
BASOS ABS: 0.1 10*3/uL (ref 0.0–0.1)
Basophils Relative: 0.6 % (ref 0.0–3.0)
EOS ABS: 0.3 10*3/uL (ref 0.0–0.7)
Eosinophils Relative: 2.3 % (ref 0.0–5.0)
HEMATOCRIT: 47.4 % (ref 39.0–52.0)
HEMOGLOBIN: 16 g/dL (ref 13.0–17.0)
LYMPHS ABS: 2.3 10*3/uL (ref 0.7–4.0)
Lymphocytes Relative: 18.4 % (ref 12.0–46.0)
MCHC: 33.8 g/dL (ref 30.0–36.0)
MCV: 98.5 fl (ref 78.0–100.0)
Monocytes Absolute: 0.7 10*3/uL (ref 0.1–1.0)
Monocytes Relative: 5.8 % (ref 3.0–12.0)
NEUTROS ABS: 9 10*3/uL — AB (ref 1.4–7.7)
Neutrophils Relative %: 72.9 % (ref 43.0–77.0)
Platelets: 173 10*3/uL (ref 150.0–400.0)
RBC: 4.81 Mil/uL (ref 4.22–5.81)
RDW: 12.7 % (ref 11.5–14.6)
WBC: 12.4 10*3/uL — ABNORMAL HIGH (ref 4.5–10.5)

## 2013-07-29 LAB — PSA: PSA: 1.18 ng/mL (ref 0.10–4.00)

## 2013-07-29 LAB — BASIC METABOLIC PANEL
BUN: 14 mg/dL (ref 6–23)
CALCIUM: 9.4 mg/dL (ref 8.4–10.5)
CO2: 29 mEq/L (ref 19–32)
CREATININE: 1.2 mg/dL (ref 0.4–1.5)
Chloride: 102 mEq/L (ref 96–112)
GFR: 67.54 mL/min (ref >60.00)
Glucose, Bld: 114 mg/dL — ABNORMAL HIGH (ref 70–99)
Potassium: 3.6 mEq/L (ref 3.5–5.1)
Sodium: 139 mEq/L (ref 135–145)

## 2013-07-29 LAB — LIPID PANEL
CHOL/HDL RATIO: 4
Cholesterol: 141 mg/dL (ref 0–200)
HDL: 36.6 mg/dL — ABNORMAL LOW (ref >39.00)
LDL Cholesterol: 80 mg/dL (ref 0–99)
Triglycerides: 124 mg/dL (ref 0.0–149.0)
VLDL: 24.8 mg/dL (ref 0.0–40.0)

## 2013-07-29 LAB — TSH: TSH: 2.84 u[IU]/mL (ref 0.35–5.50)

## 2013-07-29 LAB — ALT: ALT: 17 U/L (ref 0–53)

## 2013-07-29 LAB — HEMOGLOBIN A1C: HEMOGLOBIN A1C: 6.6 % — AB (ref 4.6–6.5)

## 2013-07-29 LAB — AST: AST: 20 U/L (ref 0–37)

## 2013-07-29 NOTE — Progress Notes (Signed)
   Subjective:    Patient ID: Alex Phillips, male    DOB: 1960/07/03, 53 y.o.   MRN: [id number]  HPI CPX  Past Medical History  Diagnosis Date  . Hypertension   . Diabetes mellitus 11/09    A1c- 6.2  . Hypothyroidism   . Hyperlipidemia   . DJD (degenerative joint disease)     l knee  . Bell's palsy   . Elevated WBC count     saw Dr.Enenever; likely benign, monitor  . EKG, abnormal 12-2007    Stress test (-)   . Elevated PSA 2010    saw urology, PSA was reched and found to be normal. Bx was canceled, did get a u/s d/t michrohematuris- normal   Past Surgical History  Procedure Laterality Date  . Tonsillectomy and adenoidectomy    . Tympanostomy tube placement      B as a child  . Vasectomy  02/2009   History   Social History  . Marital Status: Married    Spouse Name: N/A    Number of Children: 2  . Years of Education: N/A   Occupational History  . Designer, industrial/productpecial Project manager     Social History Main Topics  . Smoking status: Current Every Day Smoker -- 1.00 packs/day  . Smokeless tobacco: Never Used     Comment: ~ 1 ppd   . Alcohol Use: Yes     Comment: rare  . Drug Use: No  . Sexual Activity: Not on file   Other Topics Concern  . 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 Adopted: Yes   Review of Systems Diet, exercise-- not very good altely, his mother moved w/ him, schedule is difficult No  CP, SOB, lower extremity edema Denies  nausea, vomiting diarrhea Denies  blood in the stools (-) cough, sputum production, (-) wheezing, chest congestion (-)hemoptysis No dysuria, gross hematuria, difficulty urinating   + stress, but no anxiety, depression per se       Objective:   Physical Exam BP 122/86  Pulse 78  Temp(Src) 97.9 F (36.6 C)  Ht 6' 0.2" (1.834 m)  Wt 214 lb (97.07 kg)  BMI 28.86 kg/m2  SpO2  96%  General -- alert, well-developed, NAD.  Neck --no thyromegaly , normal carotid pulse Lungs -- normal respiratory effort, no intercostal retractions, no accessory muscle use, and normal breath sounds.  Heart-- normal rate, regular rhythm, no murmur.  Abdomen-- Not distended, good bowel sounds,soft, non-tender. + 1.5 cm reducible umbilical hernia. Rectal-- No external abnormalities noted. Normal sphincter tone. No rectal masses or tenderness. Brown stool, Hemoccult negative  Prostate--Prostate gland firm and smooth, no enlargement, nodularity, tenderness, mass, asymmetry or induration. Extremities-- no pretibial edema bilaterally  Neurologic--  alert & oriented X3. Speech normal, gait normal, strength normal in all extremities.   Psych-- Cognition and judgment appear intact. Cooperative with normal attention span and concentration. No anxious or depressed appearing.        Assessment & Plan:

## 2013-07-29 NOTE — Progress Notes (Signed)
Pre visit review using our clinic review tool, if applicable. No additional management support is needed unless otherwise documented below in the visit note. 

## 2013-07-29 NOTE — Patient Instructions (Signed)
Get your blood work before you leave    Next visit is for routine check up regards diabetes, hypertension, cholesterol,thyroid  in 4-5 months  No need to come back fasting Please make an appointment    Check the  blood pressure 2 or 3 times a month   be sure it is between 110/60 and 140/85. Ideal blood pressure is 120/80. If it is consistently higher or lower, let me know   Smoking Cessation Quitting smoking is important to your health and has many advantages. However, it is not always easy to quit since nicotine is a very addictive drug. Often times, people try 3 times or more before being able to quit. This document explains the best ways for you to prepare to quit smoking. Quitting takes hard work and a lot of effort, but you can do it. ADVANTAGES OF QUITTING SMOKING  You will live longer, feel better, and live better.  Your body will feel the impact of quitting smoking almost immediately.  Within 20 minutes, blood pressure decreases. Your pulse returns to its normal level.  After 8 hours, carbon monoxide levels in the blood return to normal. Your oxygen level increases.  After 24 hours, the chance of having a heart attack starts to decrease. Your breath, hair, and body stop smelling like smoke.  After 48 hours, damaged nerve endings begin to recover. Your sense of taste and smell improve.  After 72 hours, the body is virtually free of nicotine. Your bronchial tubes relax and breathing becomes easier.  After 2 to 12 weeks, lungs can hold more air. Exercise becomes easier and circulation improves.  The risk of having a heart attack, stroke, cancer, or lung disease is greatly reduced.  After 1 year, the risk of coronary heart disease is cut in half.  After 5 years, the risk of stroke falls to the same as a nonsmoker.  After 10 years, the risk of lung cancer is cut in half and the risk of other cancers decreases significantly.  After 15 years, the risk of coronary heart disease  drops, usually to the level of a nonsmoker.  If you are pregnant, quitting smoking will improve your chances of having a healthy baby.  The people you live with, especially any children, will be healthier.  You will have extra money to spend on things other than cigarettes. QUESTIONS TO THINK ABOUT BEFORE ATTEMPTING TO QUIT You may want to talk about your answers with your caregiver.  Why do you want to quit?  If you tried to quit in the past, what helped and what did not?  What will be the most difficult situations for you after you quit? How will you plan to handle them?  Who can help you through the tough times? Your family? Friends? A caregiver?  What pleasures do you get from smoking? What ways can you still get pleasure if you quit? Here are some questions to ask your caregiver:  How can you help me to be successful at quitting?  What medicine do you think would be best for me and how should I take it?  What should I do if I need more help?  What is smoking withdrawal like? How can I get information on withdrawal? GET READY  Set a quit date.  Change your environment by getting rid of all cigarettes, ashtrays, matches, and lighters in your home, car, or work. Do not let people smoke in your home.  Review your past attempts to quit. Think about  what worked and what did not. GET SUPPORT AND ENCOURAGEMENT You have a better chance of being successful if you have help. You can get support in many ways.  Tell your family, friends, and co-workers that you are going to quit and need their support. Ask them not to smoke around you.  Get individual, group, or telephone counseling and support. Programs are available at [employer] and health centers. Call your local health department for information about programs in your area.  Spiritual beliefs and practices may help some smokers quit.  Download a "quit meter" on your computer to keep track of quit statistics, such as how  long you have gone without smoking, cigarettes not smoked, and money saved.  Get a self-help book about quitting smoking and staying off of tobacco. LEARN NEW SKILLS AND BEHAVIORS  Distract yourself from urges to smoke. Talk to someone, go for a walk, or occupy your time with a task.  Change your normal routine. Take a different route to work. Drink tea instead of coffee. Eat breakfast in a different place.  Reduce your stress. Take a hot bath, exercise, or read a book.  Plan something enjoyable to do every day. Reward yourself for not smoking.  Explore interactive web-based programs that specialize in helping you quit. GET MEDICINE AND USE IT CORRECTLY Medicines can help you stop smoking and decrease the urge to smoke. Combining medicine with the above behavioral methods and support can greatly increase your chances of successfully quitting smoking.  Nicotine replacement therapy helps deliver nicotine to your body without the negative effects and risks of smoking. Nicotine replacement therapy includes nicotine gum, lozenges, inhalers, nasal sprays, and skin patches. Some may be available over-the-counter and others require a prescription.  Antidepressant medicine helps people abstain from smoking, but how this works is unknown. This medicine is available by prescription.  Nicotinic receptor partial agonist medicine simulates the effect of nicotine in your brain. This medicine is available by prescription. Ask your caregiver for advice about which medicines to use and how to use them based on your health history. Your caregiver will tell you what side effects to look out for if you choose to be on a medicine or therapy. Carefully read the information on the package. Do not use any other product containing nicotine while using a nicotine replacement product.  RELAPSE OR DIFFICULT SITUATIONS Most relapses occur within the first 3 months after quitting. Do not be discouraged if you start smoking  again. Remember, most people try several times before finally quitting. You may have symptoms of withdrawal because your body is used to nicotine. You may crave cigarettes, be irritable, feel very hungry, cough often, get headaches, or have difficulty concentrating. The withdrawal symptoms are only temporary. They are strongest when you first quit, but they will go away within 10 14 days. To reduce the chances of relapse, try to:  Avoid drinking alcohol. Drinking lowers your chances of successfully quitting.  Reduce the amount of caffeine you consume. Once you quit smoking, the amount of caffeine in your body increases and can give you symptoms, such as a rapid heartbeat, sweating, and anxiety.  Avoid smokers because they can make you want to smoke.  Do not let weight gain distract you. Many smokers will gain weight when they quit, usually less than 10 pounds. Eat a healthy diet and stay active. You can always lose the weight gained after you quit.  Find ways to improve your mood other than smoking. FOR MORE  INFORMATION  www.smokefree.gov  Document Released: 06/06/2001 Document Revised: 12/12/2011 Document Reviewed: 09/21/2011 [facility] Patient Information 2014 [place], [place].

## 2013-07-29 NOTE — Assessment & Plan Note (Addendum)
Td 2005 flu shot --declined  Pneumonia shot 2014 shingles shot discussed, not covered by his insurance   12-2003: Cscope , TICs, hyperplastic polyps---> over due for another  colonoscopy , pt  states will call GI. H/o elevated PSA, had a  urology evaluation 2011; he is asx, DRE today normal, check a PSA  Counseled:  quitting tobacco, exercise daily , diet. Pt aware of his high CV risk profile Chronic medical problems seem stable, labs

## 2013-08-01 ENCOUNTER — Telehealth: Payer: Self-pay

## 2013-08-01 NOTE — Telephone Encounter (Signed)
Relevant patient education assigned to patient using Emmi. ° °

## 2013-08-19 ENCOUNTER — Ambulatory Visit (AMBULATORY_SURGERY_CENTER): Payer: Self-pay

## 2013-08-19 VITALS — Ht 72.0 in | Wt 209.0 lb

## 2013-08-19 DIAGNOSIS — Z86010 Personal history of colonic polyps: Secondary | ICD-10-CM

## 2013-08-19 MED ORDER — MOVIPREP 100 G PO SOLR: ORAL | Status: DC

## 2013-08-20 ENCOUNTER — Encounter: Payer: Self-pay | Admitting: Internal Medicine

## 2013-08-21 ENCOUNTER — Encounter: Payer: Self-pay | Admitting: Internal Medicine

## 2013-09-02 ENCOUNTER — Ambulatory Visit (AMBULATORY_SURGERY_CENTER): Payer: 59 | Admitting: Internal Medicine

## 2013-09-02 ENCOUNTER — Encounter: Payer: Self-pay | Admitting: Internal Medicine

## 2013-09-02 VITALS — BP 125/67 | HR 58 | Temp 96.3°F | Resp 24 | Ht 72.0 in | Wt 209.0 lb

## 2013-09-02 DIAGNOSIS — Z86010 Personal history of colonic polyps: Secondary | ICD-10-CM

## 2013-09-02 DIAGNOSIS — Z1211 Encounter for screening for malignant neoplasm of colon: Secondary | ICD-10-CM

## 2013-09-02 MED ORDER — SODIUM CHLORIDE 0.9 % IV SOLN: 500.0000 mL | INTRAVENOUS | Status: DC

## 2013-09-02 NOTE — Progress Notes (Signed)
Report to pacu rn, vss, bbs=clear 

## 2013-09-02 NOTE — Op Note (Signed)
[facility] [address] [place], [id number]   COLONOSCOPY PROCEDURE REPORT  PATIENT: Alex Phillips, Alex C.  MR#: [id number] BIRTHDATE: Sep 27, 1960 , 52  yrs. old GENDER: Male ENDOSCOPIST: Roxy CedarJohn N Perry Jr, MD REFERRED XB:JYNWGNFAOBY:Screening Recall PROCEDURE DATE:  09/02/2013 PROCEDURE:   Colonoscopy, screening First Screening Colonoscopy - Avg.  risk and is 50 yrs.  old or older - No.  Prior Negative Screening - Now for repeat screening. 10 or more years since last screening  History of Adenoma - Now for follow-up colonoscopy & has been > or = to 3 yrs.  N/A  Polyps Removed Today? No.  Recommend repeat exam, <10 yrs? No. ASA CLASS:   Class II INDICATIONS:average risk screening.   Negative index exam 2005 (tiny hpp only) MEDICATIONS: MAC sedation, administered by CRNA and propofol (Diprivan) 250mg  IV  DESCRIPTION OF PROCEDURE:   After the risks benefits and alternatives of the procedure were thoroughly explained, informed consent was obtained.  A digital rectal exam revealed no abnormalities of the rectum.   The LB [id number]  endoscope was introduced through the anus and advanced to the cecum, which was identified by both the appendix and ileocecal valve. No adverse events experienced.   The quality of the prep was excellent, using MoviPrep  The instrument was then slowly withdrawn as the colon was fully examined.   COLON FINDINGS: Moderate diverticulosis was noted in the ascending colon and left colon.   The colon mucosa was otherwise normal. Retroflexed views revealed internal hemorrhoids. The time to cecum=3 minutes 32 seconds.  Withdrawal time=8 minutes 37 seconds. The scope was withdrawn and the procedure completed.  COMPLICATIONS: There were no complications.  ENDOSCOPIC IMPRESSION: 1.   Moderate diverticulosis was noted in the ascending colon and left colon 2.   The colon mucosa was otherwise normal  RECOMMENDATIONS: 1. Continue current colorectal  screening recommendations for "routine risk" patients with a repeat colonoscopy in 10 years.   eSigned:  Roxy CedarJohn N Perry Jr, MD 09/02/2013 10:59 AM   cc: Willow OraJose Paz, MD and The Patient

## 2013-09-02 NOTE — Patient Instructions (Signed)
YOU HAD AN ENDOSCOPIC PROCEDURE TODAY AT THE [facility]: Refer to the procedure report that was given to you for any specific questions about what was found during the examination.  If the procedure report does not answer your questions, please call your gastroenterologist to clarify.  If you requested that your care partner not be given the details of your procedure findings, then the procedure report has been included in a sealed envelope for you to review at your convenience later.  YOU SHOULD EXPECT: Some feelings of bloating in the abdomen. Passage of more gas than usual.  Walking can help get rid of the air that was put into your GI tract during the procedure and reduce the bloating. If you had a lower endoscopy (such as a colonoscopy or flexible sigmoidoscopy) you may notice spotting of blood in your stool or on the toilet paper. If you underwent a bowel prep for your procedure, then you may not have a normal bowel movement for a few days.  DIET: Your first meal following the procedure should be a light meal and then it is ok to progress to your normal diet.  A half-sandwich or bowl of soup is an example of a good first meal.  Heavy or fried foods are harder to digest and may make you feel nauseous or bloated.  Likewise meals heavy in dairy and vegetables can cause extra gas to form and this can also increase the bloating.  Drink plenty of fluids but you should avoid alcoholic beverages for 24 hours.Try to increase the fiber and water in your diet to prevent Diverticulitis.  ACTIVITY: Your care partner should take you home directly after the procedure.  You should plan to take it easy, moving slowly for the rest of the day.  You can resume normal activity the day after the procedure however you should NOT DRIVE or use heavy machinery for 24 hours (because of the sedation medicines used during the test).    SYMPTOMS TO REPORT IMMEDIATELY: A gastroenterologist can be reached at any hour.   During normal business hours, 8:30 AM to 5:00 PM Monday through Friday, call [phone].  After hours and on weekends, please call the GI answering service at [phone] who will take a message and have the physician on call contact you.   Following lower endoscopy (colonoscopy or flexible sigmoidoscopy):  Excessive amounts of blood in the stool  Significant tenderness or worsening of abdominal pains  Swelling of the abdomen that is new, acute  Fever of 100F or higher  FOLLOW UP: If any biopsies were taken you will be contacted by phone or by letter within the next 1-3 weeks.  Call your gastroenterologist if you have not heard about the biopsies in 3 weeks.  Our staff will call the home number listed on your records the next business day following your procedure to check on you and address any questions or concerns that you may have at that time regarding the information given to you following your procedure. This is a courtesy call and so if there is no answer at the home number and we have not heard from you through the emergency physician on call, we will assume that you have returned to your regular daily activities without incident.  SIGNATURES/CONFIDENTIALITY: You and/or your care partner have signed paperwork which will be entered into your electronic medical record.  These signatures attest to the fact that that the information above on your After Visit Summary has been  reviewed and is understood.  Full responsibility of the confidentiality of this discharge information lies with you and/or your care-partner. 

## 2013-09-03 ENCOUNTER — Telehealth: Payer: Self-pay | Admitting: *Deleted

## 2013-09-03 NOTE — Telephone Encounter (Signed)
  Follow up Call-  Call back number 09/02/2013  Post procedure Call Back phone  # [phone]  Permission to leave phone message Yes     Patient questions:  Do you have a fever, pain , or abdominal swelling? no Pain Score  0 *  Have you tolerated food without any problems? yes  Have you been able to return to your normal activities? yes  Do you have any questions about your discharge instructions: Diet   no Medications  no Follow up visit  no  Do you have questions or concerns about your Care? no  Actions: * If pain score is 4 or above: No action needed, pain <4.

## 2013-09-07 ENCOUNTER — Other Ambulatory Visit: Payer: Self-pay | Admitting: Internal Medicine

## 2013-10-07 ENCOUNTER — Other Ambulatory Visit: Payer: Self-pay | Admitting: Internal Medicine

## 2013-11-21 ENCOUNTER — Other Ambulatory Visit: Payer: Self-pay | Admitting: Internal Medicine

## 2013-11-21 DIAGNOSIS — E039 Hypothyroidism, unspecified: Secondary | ICD-10-CM

## 2013-11-21 DIAGNOSIS — I10 Essential (primary) hypertension: Secondary | ICD-10-CM

## 2013-11-21 NOTE — Telephone Encounter (Signed)
Refills for synthroid, norvasc and hyzaar sent to [facility] on [address]

## 2013-12-08 ENCOUNTER — Ambulatory Visit (INDEPENDENT_AMBULATORY_CARE_PROVIDER_SITE_OTHER): Payer: 59 | Admitting: Internal Medicine

## 2013-12-08 ENCOUNTER — Encounter: Payer: Self-pay | Admitting: Internal Medicine

## 2013-12-08 VITALS — BP 117/78 | HR 75 | Temp 98.0°F | Wt 219.0 lb

## 2013-12-08 DIAGNOSIS — F17200 Nicotine dependence, unspecified, uncomplicated: Secondary | ICD-10-CM

## 2013-12-08 DIAGNOSIS — Z23 Encounter for immunization: Secondary | ICD-10-CM

## 2013-12-08 DIAGNOSIS — M1990 Unspecified osteoarthritis, unspecified site: Secondary | ICD-10-CM

## 2013-12-08 DIAGNOSIS — E039 Hypothyroidism, unspecified: Secondary | ICD-10-CM

## 2013-12-08 DIAGNOSIS — E119 Type 2 diabetes mellitus without complications: Secondary | ICD-10-CM

## 2013-12-08 DIAGNOSIS — D72829 Elevated white blood cell count, unspecified: Secondary | ICD-10-CM | POA: Insufficient documentation

## 2013-12-08 LAB — TSH: TSH: 1.63 u[IU]/mL (ref 0.35–4.50)

## 2013-12-08 LAB — HEMOGLOBIN A1C: HEMOGLOBIN A1C: 6.9 % — AB (ref 4.6–6.5)

## 2013-12-08 NOTE — Assessment & Plan Note (Signed)
Counseled about tobacco abuse, he is trying to quit tobacco (vaping some)

## 2013-12-08 NOTE — Assessment & Plan Note (Signed)
Diet and exercise discussed, check the A1c 

## 2013-12-08 NOTE — Progress Notes (Signed)
Pre visit review using our clinic review tool, if applicable. No additional management support is needed unless otherwise documented below in the visit note. 

## 2013-12-08 NOTE — Assessment & Plan Note (Signed)
Trigger finger at the right index PIP, declined any treatment

## 2013-12-08 NOTE — Patient Instructions (Signed)
Get your blood work before you leave    Next visit is for routine check up regards your blood sugar , blood pressure ,  , thyroid  in 5 months ,  fasting Please make an appointment

## 2013-12-08 NOTE — Assessment & Plan Note (Signed)
Good medication compliance, check a TSH 

## 2013-12-08 NOTE — Progress Notes (Signed)
Subjective:    Patient ID: Alex PulleyDavid C Bedonie, male    DOB: 1961-04-14, 53 y.o.   MRN: [id number]  DOS:  12/08/2013 Type of  Visit: ROV History: We discussed his chronic medical problems including diabetes, tobacco abuse, thyroid dz. Also, complaining of trigger finger at the right index. Chart and pertinent labs reviewed  Good medication compliance   ROS Denies chest pain or difficulty breathing Denies cough, sputum production, wheezing or hemoptysis  Past Medical History  Diagnosis Date  . Hypertension   . Diabetes mellitus 11/09    A1c- 6.2  . Hypothyroidism   . Hyperlipidemia   . DJD (degenerative joint disease)     l knee  . Bell's palsy   . Elevated WBC count     saw Dr.Enenever; likely benign, monitor  . EKG, abnormal 12-2007    Stress test (-)   . Elevated PSA 2010    saw urology, PSA was reched and found to be normal. Bx was canceled, did get a u/s d/t michrohematuris- normal    Past Surgical History  Procedure Laterality Date  . Tonsillectomy and adenoidectomy    . Tympanostomy tube placement      B as a child  . Vasectomy  02/2009  . Mouth surgery      crown extention on right upper     History   Social History  . Marital Status: Married    Spouse Name: N/A    Number of Children: 2  . Years of Education: N/A   Occupational History  . Designer, industrial/productpecial Project manager     Social History Main Topics  . Smoking status: Current Every Day Smoker -- 1.00 packs/day    Types: Cigarettes  . Smokeless tobacco: Never Used     Comment: ~ 1 ppd   . Alcohol Use: No     Comment: rare  . Drug Use: No  . Sexual Activity: Not on file   Other Topics Concern  . 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  Always use your most recent med list.               amLODipine 10 MG tablet    Commonly known as:  NORVASC  TAKE 1 TABLET BY MOUTH ONCE DAILY     aspirin 81 MG tablet  Take 81 mg by mouth daily.     CRESTOR 40 MG tablet  Generic drug:  rosuvastatin  TAKE 1 TABLET BY MOUTH DAILY     labetalol 300 MG tablet  Commonly known as:  NORMODYNE  TAKE 1 TABLET BY MOUTH TWICE DAILY     levothyroxine 100 MCG tablet  Commonly known as:  SYNTHROID, LEVOTHROID  TAKE 1 TABLET BY MOUTH EVERY DAY     losartan-hydrochlorothiazide 100-25 MG per tablet  Commonly known as:  HYZAAR  TAKE 1 TABLET BY MOUTH DAILY           Objective:   Physical Exam BP 117/78  Pulse 75  Temp(Src) 98 F (36.7 C)  Wt 219 lb (99.338 kg)  SpO2 94% General -- alert, well-developed, NAD.  Lungs -- normal respiratory effort, no intercostal retractions, no accessory muscle use, and normal breath sounds.  Heart-- normal rate,  regular rhythm, no murmur.  Extremities-- no pretibial edema bilaterally ; index  at the right index finger with no redness, warmness or swelling. Not trigger phenomena witnessed today Neurologic--  alert & oriented X3. Speech normal, gait appropriate for age, strength symmetric and appropriate for age.   Psych-- Cognition and judgment appear intact. Cooperative with normal attention span and concentration. No anxious or depressed appearing.        Assessment & Plan:

## 2013-12-08 NOTE — Assessment & Plan Note (Signed)
Mild chronic leukocytosis, recheck in few months, consider hematology eval

## 2014-02-03 ENCOUNTER — Ambulatory Visit (INDEPENDENT_AMBULATORY_CARE_PROVIDER_SITE_OTHER): Payer: 59 | Admitting: Medical

## 2014-02-03 ENCOUNTER — Encounter: Payer: Self-pay | Admitting: Medical

## 2014-02-03 VITALS — BP 138/93 | HR 73 | Temp 98.2°F | Wt 213.0 lb

## 2014-02-03 DIAGNOSIS — R238 Other skin changes: Secondary | ICD-10-CM

## 2014-02-03 DIAGNOSIS — L989 Disorder of the skin and subcutaneous tissue, unspecified: Secondary | ICD-10-CM

## 2014-02-03 DIAGNOSIS — L089 Local infection of the skin and subcutaneous tissue, unspecified: Secondary | ICD-10-CM | POA: Insufficient documentation

## 2014-02-03 DIAGNOSIS — L259 Unspecified contact dermatitis, unspecified cause: Secondary | ICD-10-CM

## 2014-02-03 MED ORDER — DOXYCYCLINE HYCLATE 100 MG PO TABS: 100.0000 mg | ORAL_TABLET | Freq: Two times a day (BID) | ORAL | Status: DC

## 2014-02-03 MED ORDER — METHYLPREDNISOLONE ACETATE 80 MG/ML IJ SUSP
80.0000 mg | Freq: Once | INTRAMUSCULAR | Status: AC
  Administered 2014-02-03: 80 mg via INTRAMUSCULAR

## 2014-02-03 MED ORDER — PREDNISONE 20 MG PO TABS: ORAL_TABLET | ORAL | Status: DC

## 2014-02-03 NOTE — Assessment & Plan Note (Signed)
Secondary to allergic reaction. Site rt forearm. Rx doxycycline.

## 2014-02-03 NOTE — Patient Instructions (Addendum)
For you contact dermatitis(exposure to poison oak) we gave injection of depomedrol 80 mg im today and I also sent prescription of prednisone to your pharmacy. I have concern for secondary skin infection to your rt forearm so I sent in doxycycline prescription.  While on prednisone I want you to have a strict low sugar diet. These areas should gradually improve so if any worsening or changing signs or symptoms notify us. Follow up in 7 days or as needed.   Contact Dermatitis Contact dermatitis is a reaction to certain substances that touch the skin. Contact dermatitis can be either irritant contact dermatitis or allergic contact dermatitis. Irritant contact dermatitis does not require previous exposure to the substance for a reaction to occur.Allergic contact dermatitis only occurs if you have been exposed to the substance before. Upon a repeat exposure, your body reacts to the substance.  CAUSES  Many substances can cause contact dermatitis. Irritant dermatitis is most commonly caused by repeated exposure to mildly irritating substances, such as:  Makeup.  Soaps.  Detergents.  Bleaches.  Acids.  Metal salts, such as nickel. Allergic contact dermatitis is most commonly caused by exposure to:  Poisonous plants.  Chemicals (deodorants, shampoos).  Jewelry.  Latex.  Neomycin in triple antibiotic cream.  Preservatives in products, including clothing. SYMPTOMS  The area of skin that is exposed may develop:  Dryness or flaking.  Redness.  Cracks.  Itching.  Pain or a burning sensation.  Blisters. With allergic contact dermatitis, there may also be swelling in areas such as the eyelids, mouth, or genitals.  DIAGNOSIS  Your caregiver can usually tell what the problem is by doing a physical exam. In cases where the cause is uncertain and an allergic contact dermatitis is suspected, a patch skin test may be performed to help determine the cause of your  dermatitis. TREATMENT Treatment includes protecting the skin from further contact with the irritating substance by avoiding that substance if possible. Barrier creams, powders, and gloves may be helpful. Your caregiver may also recommend:  Steroid creams or ointments applied 2 times daily. For best results, soak the rash area in cool water for 20 minutes. Then apply the medicine. Cover the area with a plastic wrap. You can store the steroid cream in the refrigerator for a "chilly" effect on your rash. That may decrease itching. Oral steroid medicines may be needed in more severe cases.  Antibiotics or antibacterial ointments if a skin infection is present.  Antihistamine lotion or an antihistamine taken by mouth to ease itching.  Lubricants to keep moisture in your skin.  Burow's solution to reduce redness and soreness or to dry a weeping rash. Mix one packet or tablet of solution in 2 cups cool water. Dip a clean washcloth in the mixture, wring it out a bit, and put it on the affected area. Leave the cloth in place for 30 minutes. Do this as often as possible throughout the day.  Taking several cornstarch or baking soda baths daily if the area is too large to cover with a washcloth. Harsh chemicals, such as alkalis or acids, can cause skin damage that is like a burn. You should flush your skin for 15 to 20 minutes with cold water after such an exposure. You should also seek immediate medical care after exposure. Bandages (dressings), antibiotics, and pain medicine may be needed for severely irritated skin.  HOME CARE INSTRUCTIONS  Avoid the substance that caused your reaction.  Keep the area of skin that is affected away  from hot water, soap, sunlight, chemicals, acidic substances, or anything else that would irritate your skin.  Do not scratch the rash. Scratching may cause the rash to become infected.  You may take cool baths to help stop the itching.  Only take over-the-counter or  prescription medicines as directed by your caregiver.  See your caregiver for follow-up care as directed to make sure your skin is healing properly. SEEK MEDICAL CARE IF:   Your condition is not better after 3 days of treatment.  You seem to be getting worse.  You see signs of infection such as swelling, tenderness, redness, soreness, or warmth in the affected area.  You have any problems related to your medicines. Document Released: 06/09/2000 Document Revised: 09/04/2011 Document Reviewed: 11/15/2010 [facility] Patient Information 2015 [place], [place]. This information is not intended to replace advice given to you by your health care provider. Make sure you discuss any questions you have with your health care provider.

## 2014-02-03 NOTE — Progress Notes (Signed)
   Subjective:    Patient ID: Alex Phillips, male    DOB: 26-Mar-1961, 53 y.o.   MRN: [id number]  HPI  Pt in with history of severe reaction to poison oak. He was pulling up grape vines and pulled up poison oak. This came on Saturday evening after doing the work. This is itching severely. No wheezing or sob. Diffuse scattered rash all over papules and blisters. Started on rt forearm. He states actually wrapped what he thought was grape vine around rt forearm in order to gain leverage and pull.    Review of Systems  Constitutional: Negative for fever, chills and fatigue.  HENT: Negative.   Eyes: Positive for itching. Negative for photophobia, pain, discharge, redness and visual disturbance.       Mild rash around eye.   Respiratory: Negative for cough, chest tightness and wheezing.   Cardiovascular: Negative for chest pain and palpitations.  Gastrointestinal: Negative.   Skin: Positive for rash.       Rash on rt forearm, lt forearm, and around rt eye.  Neurological: Negative.   Hematological: Negative for adenopathy. Does not bruise/bleed easily.       Objective:   Physical Exam  Constitutional: He appears well-developed and well-nourished. No distress.  HENT:  Head: Normocephalic and atraumatic.  Eyes: Conjunctivae and EOM are normal. Pupils are equal, round, and reactive to light.  Neck: Normal range of motion. Neck supple. No JVD present. No tracheal deviation present. No thyromegaly present.  Cardiovascular: Normal rate and normal heart sounds.   Pulmonary/Chest: Effort normal and breath sounds normal. No stridor. No respiratory distress. He has no wheezes. He has no rales. He exhibits no tenderness.  Lymphadenopathy:    He has no cervical adenopathy.  Skin: He is not diaphoretic.  Rt forearm- diffuse scattered very blisters throughout. With some honey crusted appearance scattered around some blister.  Lt forearm- mild papular rash in linear pattern.  Rt periorbital area-  mild faint rash around her eye. Lids little puffy. No vesicular eruption.  Psychiatric: He has a normal mood and affect. His behavior is normal. Judgment and thought content normal.          Assessment & Plan:

## 2014-02-03 NOTE — Progress Notes (Signed)
Pre visit review using our clinic review tool, if applicable. No additional management support is needed unless otherwise documented below in the visit note. 

## 2014-02-03 NOTE — Assessment & Plan Note (Signed)
Depomedrol im and tapered prednisone rx. If areas worsen instead of improving pt will notify us. Any vesicular or blister eruption around eye asked to notify us immediately.

## 2014-02-04 ENCOUNTER — Telehealth: Payer: Self-pay | Admitting: Internal Medicine

## 2014-02-04 ENCOUNTER — Other Ambulatory Visit: Payer: Self-pay | Admitting: Internal Medicine

## 2014-02-04 NOTE — Telephone Encounter (Signed)
Relevant patient education assigned to patient using Emmi. ° °

## 2014-03-09 ENCOUNTER — Other Ambulatory Visit: Payer: Self-pay | Admitting: Internal Medicine

## 2014-03-09 ENCOUNTER — Telehealth: Payer: Self-pay

## 2014-03-09 NOTE — Telephone Encounter (Signed)
Pt stated that there was an elevation in BP due to a large out break of poison ivy. No BP recheck needed at this time.

## 2014-04-06 ENCOUNTER — Encounter: Payer: Self-pay | Admitting: Internal Medicine

## 2014-04-10 ENCOUNTER — Other Ambulatory Visit: Payer: Self-pay

## 2014-04-13 ENCOUNTER — Other Ambulatory Visit: Payer: Self-pay | Admitting: Internal Medicine

## 2014-04-13 ENCOUNTER — Other Ambulatory Visit: Payer: Self-pay

## 2014-04-13 MED ORDER — LEVOTHYROXINE SODIUM 100 MCG PO TABS: ORAL_TABLET | ORAL | Status: DC

## 2014-04-16 ENCOUNTER — Other Ambulatory Visit: Payer: Self-pay

## 2014-05-11 ENCOUNTER — Telehealth: Payer: Self-pay | Admitting: *Deleted

## 2014-05-11 ENCOUNTER — Encounter: Payer: Self-pay | Admitting: Internal Medicine

## 2014-05-11 ENCOUNTER — Ambulatory Visit: Payer: 59 | Admitting: Internal Medicine

## 2014-05-11 ENCOUNTER — Ambulatory Visit (INDEPENDENT_AMBULATORY_CARE_PROVIDER_SITE_OTHER): Payer: 59 | Admitting: Internal Medicine

## 2014-05-11 VITALS — BP 150/84 | HR 79 | Temp 97.8°F | Wt 210.4 lb

## 2014-05-11 DIAGNOSIS — E119 Type 2 diabetes mellitus without complications: Secondary | ICD-10-CM

## 2014-05-11 DIAGNOSIS — D72829 Elevated white blood cell count, unspecified: Secondary | ICD-10-CM

## 2014-05-11 DIAGNOSIS — I10 Essential (primary) hypertension: Secondary | ICD-10-CM

## 2014-05-11 DIAGNOSIS — F17200 Nicotine dependence, unspecified, uncomplicated: Secondary | ICD-10-CM

## 2014-05-11 DIAGNOSIS — E033 Postinfectious hypothyroidism: Secondary | ICD-10-CM

## 2014-05-11 DIAGNOSIS — Z720 Tobacco use: Secondary | ICD-10-CM

## 2014-05-11 NOTE — Telephone Encounter (Signed)
Pt showed up late for 8:45 appt 05/11/2014, rescheduled for 2:45pm 05/11/2014.

## 2014-05-11 NOTE — Telephone Encounter (Signed)
Ok, thx

## 2014-05-11 NOTE — Progress Notes (Signed)
Subjective:    Patient ID: Alex Phillips, male    DOB: 1961-01-10, 53 y.o.   MRN: [id number]  DOS:  05/11/2014 Type of visit - description : rov Interval history: Hypertension, good medication compliance, not ambulatory BPs Diabetes, no change on his lifestyle, due for a A1c Tobacco abuse, continued smoking, he does sees his dentist routinely. See assessment and plan Leukocytosis, labs reviewed, will recheck a CBC   ROS Denies chest pain, difficulty breathing or lower extremity edema No nausea, vomiting, diarrhea. Occasional cough, mostly at night, not daily, sometimes associated w/ sputum.   Past Medical History  Diagnosis Date  . Hypertension   . Diabetes mellitus 11/09    A1c- 6.2  . Hypothyroidism   . Hyperlipidemia   . DJD (degenerative joint disease)     l knee  . Bell's palsy   . Elevated WBC count     saw Dr.Enenever; likely benign, monitor  . EKG, abnormal 12-2007    Stress test (-)   . Elevated PSA 2010    saw urology, PSA was reched and found to be normal. Bx was canceled, did get a u/s d/t michrohematuris- normal    Past Surgical History  Procedure Laterality Date  . Tonsillectomy and adenoidectomy    . Tympanostomy tube placement      B as a child  . Vasectomy  02/2009  . Mouth surgery      crown extention on right upper     History   Social History  . Marital Status: Married    Spouse Name: N/A    Number of Children: 2  . Years of Education: N/A   Occupational History  . Designer, industrial/productpecial Project manager     Social History Main Topics  . Smoking status: Current Every Day Smoker -- 1.00 packs/day    Types: Cigarettes  . Smokeless tobacco: Never Used     Comment: ~ 1 ppd   . Alcohol Use: No     Comment: rare  . Drug Use: No  . Sexual Activity: Not on file   Other Topics Concern  . 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  Always use your most recent med list.               amLODipine 10 MG tablet  Commonly known as:  NORVASC  TAKE 1 TABLET BY MOUTH DAILY     aspirin 81 MG tablet  Take 81 mg by mouth daily.     CRESTOR 40 MG tablet  Generic drug:  rosuvastatin  TAKE 1 TABLET BY MOUTH EVERY DAY     labetalol 300 MG tablet  Commonly known as:  NORMODYNE  TAKE 1 TABLET BY MOUTH TWICE A DAY     levothyroxine 100 MCG tablet  Commonly known as:  SYNTHROID, LEVOTHROID  TAKE 1 TABLET BY MOUTH DAILY     losartan-hydrochlorothiazide 100-25 MG per tablet  Commonly known as:  HYZAAR  TAKE 1 TABLET BY MOUTH DAILY           Objective:   Physical Exam BP 150/84 mmHg  Pulse 79  Temp(Src) 97.8 F (36.6 C) (Oral)  Wt 210 lb 6 oz (95.425 kg)  SpO2 96%  General -- alert, well-developed, NAD.   Lungs -- normal respiratory effort, no intercostal retractions, no accessory muscle use, and normal breath sounds.  Heart-- normal rate, regular rhythm, no murmur.  DIABETIC FEET EXAM: No lower extremity edema Normal pedal pulses bilaterally Skin normal, nails normal, no calluses Pinprick examination of the feet normal. Neurologic--  alert & oriented X3. Speech normal, gait appropriate for age, strength symmetric and appropriate for age.  Psych-- Cognition and judgment appear intact. Cooperative with normal attention span and concentration. No anxious or depressed appearing.       Assessment & Plan:   Hypertension, BP is slightly elevated today, continue with amlodipine and losartan HCTZ, check a BMP  Hypothyroidism, last TSH normal, no change  Tobacco, counseling again, encouraged to continue thinking about quitting

## 2014-05-11 NOTE — Progress Notes (Signed)
Pre visit review using our clinic review tool, if applicable. No additional management support is needed unless otherwise documented below in the visit note. 

## 2014-05-11 NOTE — Assessment & Plan Note (Signed)
Feet exam negative today Diet and exercise discussed, check the A1c

## 2014-05-11 NOTE — Patient Instructions (Signed)
Get your blood work before you leave   Check the  blood pressure 2 or 3 times a month  Be sure your blood pressure is between  145/85  and 110/65.  if it is consistently higher or lower, let me know    Please come back to the office in 3-4 months for a physical exam. Come back fasting

## 2014-05-11 NOTE — Assessment & Plan Note (Signed)
Recheck a CBC and consider hematology referral

## 2014-05-12 LAB — CBC WITH DIFFERENTIAL/PLATELET
Basophils Absolute: 0.2 10*3/uL — ABNORMAL HIGH (ref 0.0–0.1)
Basophils Relative: 1.6 % (ref 0.0–3.0)
EOS ABS: 0.5 10*3/uL (ref 0.0–0.7)
Eosinophils Relative: 3.6 % (ref 0.0–5.0)
HCT: 47.8 % (ref 39.0–52.0)
Hemoglobin: 16.2 g/dL (ref 13.0–17.0)
Lymphocytes Relative: 23.8 % (ref 12.0–46.0)
Lymphs Abs: 3.3 10*3/uL (ref 0.7–4.0)
MCHC: 33.9 g/dL (ref 30.0–36.0)
MCV: 96.3 fl (ref 78.0–100.0)
MONO ABS: 0.8 10*3/uL (ref 0.1–1.0)
Monocytes Relative: 6.2 % (ref 3.0–12.0)
NEUTROS PCT: 64.8 % (ref 43.0–77.0)
Neutro Abs: 8.9 10*3/uL — ABNORMAL HIGH (ref 1.4–7.7)
Platelets: 177 10*3/uL (ref 150.0–400.0)
RBC: 4.97 Mil/uL (ref 4.22–5.81)
RDW: 13.3 % (ref 11.5–15.5)
WBC: 13.7 10*3/uL — AB (ref 4.0–10.5)

## 2014-05-12 LAB — BASIC METABOLIC PANEL
BUN: 11 mg/dL (ref 6–23)
CALCIUM: 9.3 mg/dL (ref 8.4–10.5)
CO2: 26 meq/L (ref 19–32)
CREATININE: 1.1 mg/dL (ref 0.4–1.5)
Chloride: 103 mEq/L (ref 96–112)
GFR: 76.86 mL/min (ref >60.00)
GLUCOSE: 135 mg/dL — AB (ref 70–99)
Potassium: 3.3 mEq/L — ABNORMAL LOW (ref 3.5–5.1)
Sodium: 137 mEq/L (ref 135–145)

## 2014-05-12 LAB — HEMOGLOBIN A1C: Hgb A1c MFr Bld: 6.8 % — ABNORMAL HIGH (ref 4.6–6.5)

## 2014-05-14 ENCOUNTER — Other Ambulatory Visit: Payer: Self-pay | Admitting: Internal Medicine

## 2014-05-14 DIAGNOSIS — D72829 Elevated white blood cell count, unspecified: Secondary | ICD-10-CM

## 2014-05-15 ENCOUNTER — Other Ambulatory Visit: Payer: Self-pay | Admitting: Internal Medicine

## 2014-05-29 ENCOUNTER — Telehealth: Payer: Self-pay | Admitting: Hematology & Oncology

## 2014-05-29 NOTE — Telephone Encounter (Signed)
I spoke w NEW PATIENT today to remind them of their appointment with Dr. Ennever. Also, advised them to bring all medication bottles and insurance card information. ° °

## 2014-06-01 ENCOUNTER — Ambulatory Visit (HOSPITAL_BASED_OUTPATIENT_CLINIC_OR_DEPARTMENT_OTHER): Payer: 59 | Admitting: Family

## 2014-06-01 ENCOUNTER — Encounter: Payer: Self-pay | Admitting: Family

## 2014-06-01 ENCOUNTER — Ambulatory Visit (HOSPITAL_BASED_OUTPATIENT_CLINIC_OR_DEPARTMENT_OTHER)
Admission: RE | Admit: 2014-06-01 | Discharge: 2014-06-01 | Disposition: A | Discharge status: Home / Self Care | Payer: 59 | Source: Ambulatory Visit | Attending: Family | Admitting: Family

## 2014-06-01 ENCOUNTER — Other Ambulatory Visit: Payer: Self-pay | Admitting: Family

## 2014-06-01 ENCOUNTER — Other Ambulatory Visit (HOSPITAL_BASED_OUTPATIENT_CLINIC_OR_DEPARTMENT_OTHER): Payer: 59 | Admitting: Lab

## 2014-06-01 ENCOUNTER — Ambulatory Visit: Payer: 59

## 2014-06-01 VITALS — BP 149/95 | HR 78 | Temp 97.9°F | Resp 18 | Ht 71.0 in | Wt 213.0 lb

## 2014-06-01 DIAGNOSIS — D72829 Elevated white blood cell count, unspecified: Secondary | ICD-10-CM | POA: Diagnosis present

## 2014-06-01 DIAGNOSIS — F17200 Nicotine dependence, unspecified, uncomplicated: Secondary | ICD-10-CM

## 2014-06-01 DIAGNOSIS — F17210 Nicotine dependence, cigarettes, uncomplicated: Secondary | ICD-10-CM | POA: Insufficient documentation

## 2014-06-01 DIAGNOSIS — Z720 Tobacco use: Secondary | ICD-10-CM

## 2014-06-01 DIAGNOSIS — E039 Hypothyroidism, unspecified: Secondary | ICD-10-CM

## 2014-06-01 LAB — CBC WITH DIFFERENTIAL (CANCER CENTER ONLY)
BASO#: 0.1 10*3/uL (ref 0.0–0.2)
BASO%: 0.4 % (ref 0.0–2.0)
EOS ABS: 0.4 10*3/uL (ref 0.0–0.5)
EOS%: 3.3 % (ref 0.0–7.0)
HCT: 45.8 % (ref 38.7–49.9)
HGB: 16 g/dL (ref 13.0–17.1)
LYMPH#: 2.8 10*3/uL (ref 0.9–3.3)
LYMPH%: 22.9 % (ref 14.0–48.0)
MCH: 33.5 pg — ABNORMAL HIGH (ref 28.0–33.4)
MCHC: 34.9 g/dL (ref 32.0–35.9)
MCV: 96 fL (ref 82–98)
MONO#: 0.6 10*3/uL (ref 0.1–0.9)
MONO%: 5 % (ref 0.0–13.0)
NEUT%: 68.4 % (ref 40.0–80.0)
NEUTROS ABS: 8.4 10*3/uL — AB (ref 1.5–6.5)
PLATELETS: 163 10*3/uL (ref 145–400)
RBC: 4.77 10*6/uL (ref 4.20–5.70)
RDW: 13.4 % (ref 11.1–15.7)
WBC: 12.3 10*3/uL — AB (ref 4.0–10.0)

## 2014-06-01 LAB — CHCC SATELLITE - SMEAR

## 2014-06-01 NOTE — Patient Instructions (Signed)
Smoking Cessation Quitting smoking is important to your health and has many advantages. However, it is not always easy to quit since nicotine is a very addictive drug. Oftentimes, people try 3 times or more before being able to quit. This document explains the best ways for you to prepare to quit smoking. Quitting takes hard work and a lot of effort, but you can do it. ADVANTAGES OF QUITTING SMOKING  You will live longer, feel better, and live better.  Your body will feel the impact of quitting smoking almost immediately.  Within 20 minutes, blood pressure decreases. Your pulse returns to its normal level.  After 8 hours, carbon monoxide levels in the blood return to normal. Your oxygen level increases.  After 24 hours, the chance of having a heart attack starts to decrease. Your breath, hair, and body stop smelling like smoke.  After 48 hours, damaged nerve endings begin to recover. Your sense of taste and smell improve.  After 72 hours, the body is virtually free of nicotine. Your bronchial tubes relax and breathing becomes easier.  After 2 to 12 weeks, lungs can hold more air. Exercise becomes easier and circulation improves.  The risk of having a heart attack, stroke, cancer, or lung disease is greatly reduced.  After 1 year, the risk of coronary heart disease is cut in half.  After 5 years, the risk of stroke falls to the same as a nonsmoker.  After 10 years, the risk of lung cancer is cut in half and the risk of other cancers decreases significantly.  After 15 years, the risk of coronary heart disease drops, usually to the level of a nonsmoker.  If you are pregnant, quitting smoking will improve your chances of having a healthy baby.  The people you live with, especially any children, will be healthier.  You will have extra money to spend on things other than cigarettes. QUESTIONS TO THINK ABOUT BEFORE ATTEMPTING TO QUIT You may want to talk about your answers with your  health care provider.  Why do you want to quit?  If you tried to quit in the past, what helped and what did not?  What will be the most difficult situations for you after you quit? How will you plan to handle them?  Who can help you through the tough times? Your family? Friends? A health care provider?  What pleasures do you get from smoking? What ways can you still get pleasure if you quit? Here are some questions to ask your health care provider:  How can you help me to be successful at quitting?  What medicine do you think would be best for me and how should I take it?  What should I do if I need more help?  What is smoking withdrawal like? How can I get information on withdrawal? GET READY  Set a quit date.  Change your environment by getting rid of all cigarettes, ashtrays, matches, and lighters in your home, car, or work. Do not let people smoke in your home.  Review your past attempts to quit. Think about what worked and what did not. GET SUPPORT AND ENCOURAGEMENT You have a better chance of being successful if you have help. You can get support in many ways.  Tell your family, friends, and coworkers that you are going to quit and need their support. Ask them not to smoke around you.  Get individual, group, or telephone counseling and support. Programs are available at local hospitals and health centers. Call   your local health department for information about programs in your area.  Spiritual beliefs and practices may help some smokers quit.  Download a "quit meter" on your computer to keep track of quit statistics, such as how long you have gone without smoking, cigarettes not smoked, and money saved.  Get a self-help book about quitting smoking and staying off tobacco. LEARN NEW SKILLS AND BEHAVIORS  Distract yourself from urges to smoke. Talk to someone, go for a walk, or occupy your time with a task.  Change your normal routine. Take a different route to work.  Drink tea instead of coffee. Eat breakfast in a different place.  Reduce your stress. Take a hot bath, exercise, or read a book.  Plan something enjoyable to do every day. Reward yourself for not smoking.  Explore interactive web-based programs that specialize in helping you quit. GET MEDICINE AND USE IT CORRECTLY Medicines can help you stop smoking and decrease the urge to smoke. Combining medicine with the above behavioral methods and support can greatly increase your chances of successfully quitting smoking.  Nicotine replacement therapy helps deliver nicotine to your body without the negative effects and risks of smoking. Nicotine replacement therapy includes nicotine gum, lozenges, inhalers, nasal sprays, and skin patches. Some may be available over-the-counter and others require a prescription.  Antidepressant medicine helps people abstain from smoking, but how this works is unknown. This medicine is available by prescription.  Nicotinic receptor partial agonist medicine simulates the effect of nicotine in your brain. This medicine is available by prescription. Ask your health care provider for advice about which medicines to use and how to use them based on your health history. Your health care provider will tell you what side effects to look out for if you choose to be on a medicine or therapy. Carefully read the information on the package. Do not use any other product containing nicotine while using a nicotine replacement product.  RELAPSE OR DIFFICULT SITUATIONS Most relapses occur within the first 3 months after quitting. Do not be discouraged if you start smoking again. Remember, most people try several times before finally quitting. You may have symptoms of withdrawal because your body is used to nicotine. You may crave cigarettes, be irritable, feel very hungry, cough often, get headaches, or have difficulty concentrating. The withdrawal symptoms are only temporary. They are strongest  when you first quit, but they will go away within 10-14 days. To reduce the chances of relapse, try to:  Avoid drinking alcohol. Drinking lowers your chances of successfully quitting.  Reduce the amount of caffeine you consume. Once you quit smoking, the amount of caffeine in your body increases and can give you symptoms, such as a rapid heartbeat, sweating, and anxiety.  Avoid smokers because they can make you want to smoke.  Do not let weight gain distract you. Many smokers will gain weight when they quit, usually less than 10 pounds. Eat a healthy diet and stay active. You can always lose the weight gained after you quit.  Find ways to improve your mood other than smoking. FOR MORE INFORMATION  www.smokefree.gov  Document Released: 06/06/2001 Document Revised: 10/27/2013 Document Reviewed: 09/21/2011 [employer] Patient Information 2015 [employer]. This information is not intended to replace advice given to you by your health care provider. Make sure you discuss any questions you have with your health care provider.  

## 2014-06-01 NOTE — Progress Notes (Signed)
Hematology/Oncology Consultation   Name: Alex Phillips      MRN: [id number]    Location: Room/bed info Alex Phillips  Date: 06/01/2014 Time:1:29 PM   REFERRING PHYSICIAN:  Jose E Paz  REASON FOR CONSULT:  Leukocyotsis   DIAGNOSIS: Mild leukocytosis  HISTORY OF PRESENT ILLNESS:  [name] is a very pleasant 53 yo male with a history of leukocytosis. He is here today with his wife after having a WBC of 13.7 during a PCP visit. His WBC today is 12.3.   He saw Dr. Myna HidalgoEnnever 10 years ago for this same issue and has had no changes since then. He has had no problems with infection. He denies fever, chills, n/v, cough, rash, headache, dizziness, SOB, chest pain, palpitations, abdominal pain, constipation, diarrhea, blood in urine or stool.  No swelling, tenderness, numbness or tingling in his extremities.  His appetite is good and he is drinking plenty of fluids. His weight is stable.  [place] and does not know his family health history.  He has hypothyroidism and takes Synthroid.  He is a smoker 1 -1 1/2 ppd. He does not drink alcohol.  He has no personal bleeding, clotting or cancer history.  His last chest x-ray was in 2005. We will schedule another one for next week.  He works for a [employer] and does have some stress with his job.  Overall, he seems to be doing quite well.   ROS: All other 10 point review of systems is negative.   PAST MEDICAL HISTORY:   Past Medical History  Diagnosis Date  . Hypertension   . Diabetes mellitus 11/09    A1c- 6.2  . Hypothyroidism   . Hyperlipidemia   . DJD (degenerative joint disease)     l knee  . Bell's palsy   . Elevated WBC count     saw Dr.Enenever; likely benign, monitor  . EKG, abnormal 12-2007    Stress test (-)   . Elevated PSA 2010    saw urology, PSA was reched and Phillips to be normal. Bx was canceled, did get a u/s d/t michrohematuris- normal  . HTN (hypertension) 11/14/2006    Qualifier:  Diagnosis of  By: Drue NovelPaz MD, Jose E.     ALLERGIES: Allergies  Allergen Reactions  . Amoxicillin-Pot Clavulanate     REACTION: rash/swelling/nausea  . Cefaclor       MEDICATIONS:  Current Outpatient Prescriptions on File Prior to Visit  Medication Sig Dispense Refill  . amLODipine (NORVASC) 10 MG tablet TAKE 1 TABLET BY MOUTH DAILY 90 tablet 0  . aspirin 81 MG tablet Take 81 mg by mouth daily.      . CRESTOR 40 MG tablet TAKE 1 TABLET BY MOUTH EVERY DAY 30 tablet 1  . labetalol (NORMODYNE) 300 MG tablet TAKE 1 TABLET BY MOUTH TWICE DAILY 180 tablet 1  . levothyroxine (SYNTHROID, LEVOTHROID) 100 MCG tablet TAKE 1 TABLET BY MOUTH DAILY 90 tablet 0  . losartan-hydrochlorothiazide (HYZAAR) 100-25 MG per tablet TAKE 1 TABLET BY MOUTH DAILY 90 tablet 0   No current facility-administered medications on file prior to visit.     PAST SURGICAL HISTORY Past Surgical History  Procedure Laterality Date  . Tonsillectomy and adenoidectomy    . Tympanostomy tube placement      B as a child  . Vasectomy  02/2009  . Mouth surgery      crown extention on right upper  FAMILY HISTORY: Family History  Problem Relation Age of Onset  . Adopted: Yes  . Colon cancer Neg Hx   . Stomach cancer Neg Hx     SOCIAL HISTORY:  reports that he has been smoking Cigarettes.  He started smoking about 32 years ago. He has a 51 pack-year smoking history. He has never used smokeless tobacco. He reports that he does not drink alcohol or use illicit drugs.  PERFORMANCE STATUS: The patient's performance status is 0 - Asymptomatic  PHYSICAL EXAM: Most Recent Vital Signs: Blood pressure 149/95, pulse 78, temperature 97.9 F (36.6 C), temperature source Oral, resp. rate 18, height 5\' 11"  (1.803 m), weight 213 lb (96.616 kg). BP 149/95 mmHg  Pulse 78  Temp(Src) 97.9 F (36.6 C) (Oral)  Resp 18  Ht 5\' 11"  (1.803 m)  Wt 213 lb (96.616 kg)  BMI 29.72 kg/m2  General Appearance:    Alert, cooperative, no  distress, appears stated age  Head:    Normocephalic, without obvious abnormality, atraumatic  Eyes:    PERRL, conjunctiva/corneas clear, EOM's intact, fundi    benign, both eyes             Throat:   Lips, mucosa, and tongue normal; teeth and gums normal  Neck:   Supple, symmetrical, trachea midline, no adenopathy;       thyroid:  No enlargement/tenderness/nodules; no carotid   bruit or JVD  Back:     Symmetric, no curvature, ROM normal, no CVA tenderness  Lungs:     Clear to auscultation bilaterally, respirations unlabored  Chest wall:    No tenderness or deformity  Heart:    Regular rate and rhythm, S1 and S2 normal, no murmur, rub   or gallop  Abdomen:     Soft, non-tender, bowel sounds active all four quadrants,    no masses, no organomegaly        Extremities:   Extremities normal, atraumatic, no cyanosis or edema  Pulses:   2+ and symmetric all extremities  Skin:   Skin color, texture, turgor normal, no rashes or lesions  Lymph nodes:   Cervical, supraclavicular, and axillary nodes normal  Neurologic:   CNII-XII intact. Normal strength, sensation and reflexes      throughout   LABORATORY DATA:  No results Phillips for this or any previous visit (from the past 48 hour(s)).    RADIOGRAPHY: No results Phillips.     PATHOLOGY: None  ASSESSMENT/PLAN: [name] is a 53 yo male with mild leukocytosis. He has had this for over 10 years. He is completely asymptomatic and has had no problems with infections.  His WBC today is 12.3.  He is a smoker and has not had a chest xray since 2005 so we will get another next week.  I do not think that we need to schedule a follow-up with him at this time.   All questions were answered. He knows to here with any problems, questions or concerns. We can certainly see him back again if he needs us. The patient was discussed with Dr. Myna HidalgoEnnever and he is in agreement with the aforementioned.   [facility],SARAH M

## 2014-06-15 ENCOUNTER — Other Ambulatory Visit: Payer: Self-pay | Admitting: Internal Medicine

## 2014-08-24 ENCOUNTER — Other Ambulatory Visit: Payer: Self-pay

## 2014-08-25 ENCOUNTER — Encounter: Payer: Self-pay | Admitting: Internal Medicine

## 2014-08-25 ENCOUNTER — Ambulatory Visit (INDEPENDENT_AMBULATORY_CARE_PROVIDER_SITE_OTHER): Payer: BLUE CROSS/BLUE SHIELD | Admitting: Internal Medicine

## 2014-08-25 VITALS — BP 120/79 | HR 72 | Temp 97.9°F | Ht 71.0 in | Wt 210.0 lb

## 2014-08-25 DIAGNOSIS — Z23 Encounter for immunization: Secondary | ICD-10-CM

## 2014-08-25 DIAGNOSIS — E119 Type 2 diabetes mellitus without complications: Secondary | ICD-10-CM

## 2014-08-25 DIAGNOSIS — Z0000 Encounter for general adult medical examination without abnormal findings: Secondary | ICD-10-CM

## 2014-08-25 NOTE — Assessment & Plan Note (Addendum)
Tdap 2015 prevnar- today Pneumonia shot 2014 shingles shot discussed, not covered by his insurance   12-2003: Cscope , TICs, hyperplastic polyps  cscope 08-2013, no polyps, 10 years  EKG sinus rhythm, no acute changes H/o elevated PSA, had a  urology evaluation 2011; he is asx, DRE- PSA 2015 normal; repeat screening next year   Counseled:  quitting tobacco, continue dental exam q 6 months, exercise daily , diet.  Chronic medical problems: Good compliance of medication, BPs under good control, check A1c, chronically elevated WBCs, recently saw by hematology, recommend to come back as needed.

## 2014-08-25 NOTE — Patient Instructions (Addendum)
Please schedule labs to be done within few days (fasting)  Please go to the front and schedule a visit in 6 months for a checkup no fasting

## 2014-08-25 NOTE — Progress Notes (Signed)
Pre visit review using our clinic review tool, if applicable. No additional management support is needed unless otherwise documented below in the visit note. 

## 2014-08-25 NOTE — Progress Notes (Signed)
Subjective:    Patient ID: Alex Phillips, male    DOB: 1961-04-19, 54 y.o.   MRN: [id number]  DOS:  08/25/2014 Type of visit - description : cpx Interval history: In general feeling well, had a colonoscopy last year, good compliance with all medications, ambulatory BPs in the 129/87 range.  Review of Systems Constitutional: No fever, chills. No unexplained wt changes. No unusual sweats HEENT: No dental problems, ear discharge, facial swelling, voice changes. No eye discharge, redness or intolerance to light Respiratory: No wheezing or difficulty breathing. No cough , mucus production Cardiovascular: No CP, leg swelling or palpitations GI: no nausea, vomiting, diarrhea or abdominal pain.  No blood in the stools. No dysphagia   Endocrine: No polyphagia, polyuria or polydipsia GU: No dysuria, gross hematuria, difficulty urinating. No urinary urgency or frequency. Musculoskeletal: No joint swellings or unusual aches or pains Skin: No change in the color of the skin, palor or rash Allergic, immunologic: No environmental allergies or food allergies Neurological: No dizziness or syncope. No headaches. No diplopia, slurred speech, motor deficits, facial numbness Hematological: No enlarged lymph nodes, easy bruising or bleeding Psychiatry: No suicidal ideas, hallucinations, behavior problems or confusion.  Increased distress, father in law diagnosed with cancer, handling the situation okay.    Past Medical History  Diagnosis Date  . Hypertension   . Diabetes mellitus 11/09    A1c- 6.2  . Hypothyroidism   . Hyperlipidemia   . DJD (degenerative joint disease)     l knee  . Bell's palsy   . Elevated WBC count     saw Dr.Enenever; likely benign, monitor  . EKG, abnormal 12-2007    Stress test (-)   . Elevated PSA 2010    saw urology, PSA was reched and found to be normal. Bx was canceled, did get a u/s d/t michrohematuris- normal  . HTN (hypertension) 11/14/2006    Past Surgical  History  Procedure Laterality Date  . Tonsillectomy and adenoidectomy    . Tympanostomy tube placement      B as a child  . Vasectomy  02/2009  . Mouth surgery      crown extention on right upper     History   Social History  . Marital Status: Married    Spouse Name: N/A  . Number of Children: 2  . Years of Education: N/A   Occupational History  . Designer, industrial/productpecial Project manager     Social History Main Topics  . Smoking status: Current Every Day Smoker -- 1.50 packs/day for 34 years    Types: Cigarettes    Start date: 08/30/1981  . Smokeless tobacco: Never Used     Comment: 12-15   still smoking  . Alcohol Use: No     Comment: rare  . Drug Use: No  . Sexual Activity: Not on file   Other Topics Concern  . 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 Adopted: Yes  . Colon cancer Neg Hx   . Stomach cancer Neg Hx        Medication List       This list is accurate as of: 08/25/14  5:30 PM.  Always use your most recent med list.  amLODipine 10 MG tablet  Commonly known as:  NORVASC  TAKE 1 TABLET BY MOUTH EVERY DAY     aspirin 81 MG tablet  Take 81 mg by mouth daily.     CRESTOR 40 MG tablet  Generic drug:  rosuvastatin  TAKE 1 TABLET BY MOUTH EVERY DAY     labetalol 300 MG tablet  Commonly known as:  NORMODYNE  TAKE 1 TABLET BY MOUTH TWICE DAILY     levothyroxine 100 MCG tablet  Commonly known as:  SYNTHROID, LEVOTHROID  TAKE 1 TABLET BY MOUTH EVERY DAY     losartan-hydrochlorothiazide 100-25 MG per tablet  Commonly known as:  HYZAAR  TAKE 1 TABLET BY MOUTH DAILY           Objective:   Physical Exam BP 120/79 mmHg  Pulse 72  Temp(Src) 97.9 F (36.6 C) (Oral)  Ht  (1.803 m)  Wt 210 lb (95.255 kg)  BMI 29.30 kg/m2  SpO2 95% General:   Well developed, well nourished . NAD.    Neck:  Full range of motion. Supple. No  thyromegaly , normal carotid pulse . HEENT:  Normocephalic . Face symmetric, atraumatic Lungs:  CTA B Normal respiratory effort, no intercostal retractions, no accessory muscle use. Heart: RRR,  no murmur.  Abdomen:  Not distended, soft, non-tender. No rebound or rigidity. No mass,organomegaly. No bruit Muscle skeletal: no pretibial edema bilaterally  Skin: Exposed areas without rash. Not pale. Not jaundice Neurologic:  alert & oriented X3.  Speech normal, gait appropriate for age and unassisted Strength symmetric (except for facial weakness due to history of Bell palsy) and appropriate for age.  Psych: Cognition and judgment appear intact.  Cooperative with normal attention span and concentration.  Behavior appropriate. No anxious or depressed appearing.      Assessment & Plan:

## 2014-08-26 ENCOUNTER — Other Ambulatory Visit (INDEPENDENT_AMBULATORY_CARE_PROVIDER_SITE_OTHER): Payer: BLUE CROSS/BLUE SHIELD

## 2014-08-26 DIAGNOSIS — E119 Type 2 diabetes mellitus without complications: Secondary | ICD-10-CM

## 2014-08-26 DIAGNOSIS — Z0000 Encounter for general adult medical examination without abnormal findings: Secondary | ICD-10-CM

## 2014-08-26 LAB — LIPID PANEL
CHOL/HDL RATIO: 4
Cholesterol: 127 mg/dL (ref 0–200)
HDL: 32.7 mg/dL — ABNORMAL LOW (ref >39.00)
LDL CALC: 68 mg/dL (ref 0–99)
NONHDL: 94.3
TRIGLYCERIDES: 134 mg/dL (ref 0.0–149.0)
VLDL: 26.8 mg/dL (ref 0.0–40.0)

## 2014-08-26 LAB — BASIC METABOLIC PANEL
BUN: 13 mg/dL (ref 6–23)
CO2: 30 mEq/L (ref 19–32)
Calcium: 9.5 mg/dL (ref 8.4–10.5)
Chloride: 102 mEq/L (ref 96–112)
Creatinine, Ser: 1.21 mg/dL (ref 0.40–1.50)
GFR: 66.62 mL/min (ref >60.00)
Glucose, Bld: 130 mg/dL — ABNORMAL HIGH (ref 70–99)
Potassium: 3.7 mEq/L (ref 3.5–5.1)
Sodium: 137 mEq/L (ref 135–145)

## 2014-08-26 LAB — HEMOGLOBIN A1C: HEMOGLOBIN A1C: 6.9 % — AB (ref 4.6–6.5)

## 2014-08-26 LAB — ALT: ALT: 16 U/L (ref 0–53)

## 2014-08-26 LAB — TSH: TSH: 2.53 u[IU]/mL (ref 0.35–4.50)

## 2014-08-26 LAB — AST: AST: 15 U/L (ref 0–37)

## 2014-11-02 ENCOUNTER — Other Ambulatory Visit: Payer: Self-pay | Admitting: Internal Medicine

## 2014-12-04 ENCOUNTER — Other Ambulatory Visit: Payer: Self-pay | Admitting: Internal Medicine

## 2014-12-21 ENCOUNTER — Other Ambulatory Visit: Payer: Self-pay

## 2015-01-07 ENCOUNTER — Other Ambulatory Visit: Payer: Self-pay | Admitting: Internal Medicine

## 2015-02-25 ENCOUNTER — Ambulatory Visit: Payer: BLUE CROSS/BLUE SHIELD | Admitting: Internal Medicine

## 2015-02-26 ENCOUNTER — Encounter: Payer: Self-pay | Admitting: Internal Medicine

## 2015-02-26 ENCOUNTER — Ambulatory Visit (INDEPENDENT_AMBULATORY_CARE_PROVIDER_SITE_OTHER): Payer: BLUE CROSS/BLUE SHIELD | Admitting: Internal Medicine

## 2015-02-26 VITALS — BP 122/80 | HR 72 | Temp 97.5°F | Ht 71.0 in | Wt 214.1 lb

## 2015-02-26 DIAGNOSIS — Z114 Encounter for screening for human immunodeficiency virus [HIV]: Secondary | ICD-10-CM

## 2015-02-26 DIAGNOSIS — I10 Essential (primary) hypertension: Secondary | ICD-10-CM

## 2015-02-26 DIAGNOSIS — E119 Type 2 diabetes mellitus without complications: Secondary | ICD-10-CM | POA: Diagnosis not present

## 2015-02-26 DIAGNOSIS — Z1159 Encounter for screening for other viral diseases: Secondary | ICD-10-CM

## 2015-02-26 DIAGNOSIS — Z09 Encounter for follow-up examination after completed treatment for conditions other than malignant neoplasm: Secondary | ICD-10-CM

## 2015-02-26 DIAGNOSIS — E039 Hypothyroidism, unspecified: Secondary | ICD-10-CM

## 2015-02-26 LAB — HEMOGLOBIN A1C
Hgb A1c MFr Bld: 6.4 % — ABNORMAL HIGH (ref <5.7)
Mean Plasma Glucose: 137 mg/dL — ABNORMAL HIGH (ref <117)

## 2015-02-26 LAB — BASIC METABOLIC PANEL
BUN: 10 mg/dL (ref 7–25)
CO2: 25 mmol/L (ref 20–31)
CREATININE: 1.02 mg/dL (ref 0.70–1.33)
Calcium: 9 mg/dL (ref 8.6–10.3)
Chloride: 99 mmol/L (ref 98–110)
GLUCOSE: 162 mg/dL — AB (ref 65–99)
Potassium: 3.1 mmol/L — ABNORMAL LOW (ref 3.5–5.3)
SODIUM: 136 mmol/L (ref 135–146)

## 2015-02-26 LAB — HM DIABETES FOOT EXAM: HM Diabetic Foot Exam: NORMAL

## 2015-02-26 LAB — TSH: TSH: 2.115 u[IU]/mL (ref 0.350–4.500)

## 2015-02-26 NOTE — Progress Notes (Signed)
Pre visit review using our clinic review tool, if applicable. No additional management support is needed unless otherwise documented below in the visit note. 

## 2015-02-26 NOTE — Patient Instructions (Signed)
Get your blood work before you leave       Next visit  for a physical exam by March 2017 Please schedule an appointment at the front desk Please come back fasting      All about diabetes, great resource!  [email or web address]

## 2015-02-26 NOTE — Progress Notes (Signed)
Subjective:    Patient ID: Alex Phillips, male    DOB: [birth year], 54 y.o.   MRN: [id number]  DOS:  02/26/2015 Type of visit - description : Routine office visit Interval history: Diabetes: On diet only, not ambulatory CBGs, the patient travels a lot for work, it is hard to eat healthy Hypertension: Good compliance of medication, no apparent side effects, ambulatory BPs never more than 130/80 Hyperlipidemia: Good compliance of medication, last FLP satisfactory Hypothyroidism: On Synthroid, due for a TSH    Review of Systems denies chest pain or difficulty breathing No nausea, vomiting, diarrhea   Past Medical History  Diagnosis Date  . Hypertension   . Diabetes mellitus 11/09    A1c- 6.2  . Hypothyroidism   . Hyperlipidemia   . DJD (degenerative joint disease)     l knee  . Bell's palsy   . Elevated WBC count     saw Dr.Enenever; likely benign, monitor  . EKG, abnormal 12-2007    Stress test (-)   . Elevated PSA 2010    saw urology, PSA was reched and found to be normal. Bx was canceled, did get a u/s d/t michrohematuris- normal  . HTN (hypertension) 11/14/2006    Past Surgical History  Procedure Laterality Date  . Tonsillectomy and adenoidectomy    . Tympanostomy tube placement      B as a child  . Vasectomy  02/2009  . Mouth surgery      crown extention on right upper     Social History   Social History  . Marital Status: Married    Spouse Name: N/A  . Number of Children: 2  . Years of Education: N/A   Occupational History  . Designer, industrial/product     Social History Main Topics  . Smoking status: Current Every Day Smoker -- 1.50 packs/day for 34 years    Types: Cigarettes    Start date: 08/30/1981  . Smokeless tobacco: Never Used     Comment: 12-15   still smoking  . Alcohol Use: No     Comment: rare  . Drug Use: No  . Sexual Activity: Not on file   Other Topics Concern  . 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  Always use your most recent med list.               amLODipine 10 MG tablet  Commonly known as:  NORVASC  Take 1 tablet (10 mg total) by mouth daily.     aspirin 81 MG tablet  Take 81 mg by mouth daily.     labetalol 300 MG tablet  Commonly known as:  NORMODYNE  Take 1 tablet (300 mg total) by mouth 2 (two) times daily.     levothyroxine 100 MCG tablet  Commonly known as:  SYNTHROID, LEVOTHROID  Take 1 tablet (100 mcg total) by mouth daily.     losartan-hydrochlorothiazide 100-25 MG per tablet  Commonly known as:  HYZAAR  Take 1 tablet by mouth daily.     rosuvastatin 40 MG tablet  Commonly known as:  CRESTOR  Take 1 tablet (40 mg total)  by mouth daily.           Objective:   Physical Exam BP 122/80 mmHg  Pulse 72  Temp(Src) 97.5 F (36.4 C) (Oral)  Ht  (1.803 m)  Wt 214 lb 2 oz (97.126 kg)  BMI 29.88 kg/m2  SpO2 95% General:   Well developed, well nourished . NAD.  HEENT:  Normocephalic . Face symmetric, atraumatic Lungs:  CTA B Normal respiratory effort, no intercostal retractions, no accessory muscle use. Heart: RRR,  no murmur.  No pretibial edema bilaterally  Diabetic foot exam: Good pedal pulses, no edema, skin normal, pinprick examination normal Neurologic:  alert & oriented X3.  Speech normal, gait appropriate for age and unassisted Psych--  Cognition and judgment appear intact.  Cooperative with normal attention span and concentration.  Behavior appropriate. No anxious or depressed appearing.      Assessment & Plan:   A/P Diabetes: On diet only, last A1c 6.9, goal is 6.5. Will check A1c, consider medication. Counseled about diet and exercise, unfortunately he travels a lot and diabetes difficult.foot exam normal today;  recommend to see the eye doctor regularly  Hypothyroidism: Good compliance with Synthroid, check a TSH Hypertension: Good compliance of medication, ambulatory BP satisfactory, check a BMP Primary care: Declined a flu shot.

## 2015-02-27 LAB — HEPATITIS C ANTIBODY: HCV Ab: NEGATIVE

## 2015-02-27 LAB — HIV ANTIBODY (ROUTINE TESTING W REFLEX): HIV 1&2 Ab, 4th Generation: NONREACTIVE

## 2015-03-01 DIAGNOSIS — Z09 Encounter for follow-up examination after completed treatment for conditions other than malignant neoplasm: Secondary | ICD-10-CM | POA: Insufficient documentation

## 2015-03-01 NOTE — Assessment & Plan Note (Signed)
Diabetes: On diet only, last A1c 6.9, goal is 6.5. Will check A1c, consider medication. Counseled about diet and exercise, unfortunately he travels a lot and diabetes difficult.foot exam normal today; recommend to see the eye doctor regularly  Hypothyroidism: Good compliance with Synthroid, check a TSH Hypertension: Good compliance of medication, ambulatory BP satisfactory, check a BMP Primary care: Declined a flu shot.

## 2015-03-03 MED ORDER — POTASSIUM CHLORIDE ER 10 MEQ PO TBCR: 10.0000 meq | EXTENDED_RELEASE_TABLET | Freq: Every day | ORAL | Status: DC

## 2015-03-03 NOTE — Addendum Note (Signed)
Addended by: Dorette Grate on: 03/03/2015 08:14 AM   Modules accepted: Orders

## 2015-05-14 ENCOUNTER — Other Ambulatory Visit: Payer: Self-pay | Admitting: Internal Medicine

## 2015-06-13 ENCOUNTER — Other Ambulatory Visit: Payer: Self-pay | Admitting: Internal Medicine

## 2015-08-07 ENCOUNTER — Other Ambulatory Visit: Payer: Self-pay | Admitting: Internal Medicine

## 2015-08-09 ENCOUNTER — Other Ambulatory Visit: Payer: Self-pay | Admitting: Internal Medicine

## 2015-08-26 ENCOUNTER — Encounter: Payer: BLUE CROSS/BLUE SHIELD | Admitting: Internal Medicine

## 2015-09-06 ENCOUNTER — Telehealth: Payer: Self-pay | Admitting: Internal Medicine

## 2015-09-06 MED ORDER — LABETALOL HCL 300 MG PO TABS: 300.0000 mg | ORAL_TABLET | Freq: Two times a day (BID) | ORAL | Status: DC

## 2015-09-06 MED ORDER — AMLODIPINE BESYLATE 10 MG PO TABS: 10.0000 mg | ORAL_TABLET | Freq: Every day | ORAL | Status: DC

## 2015-09-06 MED ORDER — ROSUVASTATIN CALCIUM 40 MG PO TABS: 40.0000 mg | ORAL_TABLET | Freq: Every day | ORAL | Status: DC

## 2015-09-06 MED ORDER — LOSARTAN POTASSIUM-HCTZ 100-25 MG PO TABS: 1.0000 | ORAL_TABLET | Freq: Every day | ORAL | Status: DC

## 2015-09-06 MED ORDER — LEVOTHYROXINE SODIUM 100 MCG PO TABS: 100.0000 ug | ORAL_TABLET | Freq: Every day | ORAL | Status: DC

## 2015-09-06 NOTE — Telephone Encounter (Signed)
Pt says that his insurance is now requiring that he use mail order. Pt need a refill on 5 medications.   1. Levothyroxine  2. Losartan  3. amLODipine  4. labetalol   5. Rosuvastatin  Pharmacy: [facility]   Pt says that he will fax over a copy of his new card

## 2015-09-06 NOTE — Telephone Encounter (Signed)
Rxs sent

## 2015-09-23 ENCOUNTER — Ambulatory Visit (INDEPENDENT_AMBULATORY_CARE_PROVIDER_SITE_OTHER): Payer: Managed Care, Other (non HMO) | Admitting: Internal Medicine

## 2015-09-23 ENCOUNTER — Encounter: Payer: Self-pay | Admitting: Internal Medicine

## 2015-09-23 VITALS — BP 120/70 | HR 65 | Temp 97.5°F | Ht 71.0 in | Wt 218.0 lb

## 2015-09-23 DIAGNOSIS — Z0000 Encounter for general adult medical examination without abnormal findings: Secondary | ICD-10-CM

## 2015-09-23 DIAGNOSIS — Z09 Encounter for follow-up examination after completed treatment for conditions other than malignant neoplasm: Secondary | ICD-10-CM

## 2015-09-23 DIAGNOSIS — Z125 Encounter for screening for malignant neoplasm of prostate: Secondary | ICD-10-CM

## 2015-09-23 DIAGNOSIS — E119 Type 2 diabetes mellitus without complications: Secondary | ICD-10-CM

## 2015-09-23 MED ORDER — VARENICLINE TARTRATE 1 MG PO TABS: 1.0000 mg | ORAL_TABLET | Freq: Two times a day (BID) | ORAL | Status: DC

## 2015-09-23 MED ORDER — VARENICLINE TARTRATE 0.5 MG X 11 & 1 MG X 42 PO MISC: ORAL | Status: DC

## 2015-09-23 NOTE — Patient Instructions (Signed)
GO TO THE FRONT DESK  Schedule labs to be done within few days, fasting  Schedule your next appointment for a  Check up When?   4 months Fasting?  No    ------------------------ Quit tobacco in 2 weeks after you start Chantix  okay to use a small amount of nicotine gum  Call if problems or side effects     Steps to Quit Smoking  Smoking tobacco can be harmful to your health and can affect almost every organ in your body. Smoking puts you, and those around you, at risk for developing many serious chronic diseases. Quitting smoking is difficult, but it is one of the best things that you can do for your health. It is never too late to quit. WHAT ARE THE BENEFITS OF QUITTING SMOKING? When you quit smoking, you lower your risk of developing serious diseases and conditions, such as:  Lung cancer or lung disease, such as COPD.  Heart disease.  Stroke.  Heart attack.  Infertility.  Osteoporosis and bone fractures. Additionally, symptoms such as coughing, wheezing, and shortness of breath may get better when you quit. You may also find that you get sick less often because your body is stronger at fighting off colds and infections. If you are pregnant, quitting smoking can help to reduce your chances of having a baby of low birth weight. HOW DO I GET READY TO QUIT? When you decide to quit smoking, create a plan to make sure that you are successful. Before you quit:  Pick a date to quit. Set a date within the next two weeks to give you time to prepare.  Write down the reasons why you are quitting. Keep this list in places where you will see it often, such as on your bathroom mirror or in your car or wallet.  Identify the people, places, things, and activities that make you want to smoke (triggers) and avoid them. Make sure to take these actions:  Throw away all cigarettes at home, at work, and in your car.  Throw away smoking accessories, such as Set designer.  Clean  your car and make sure to empty the ashtray.  Clean your home, including curtains and carpets.  Tell your family, friends, and coworkers that you are quitting. Support from your loved ones can make quitting easier.  Talk with your health care provider about your options for quitting smoking.  Find out what treatment options are covered by your health insurance. WHAT STRATEGIES CAN I USE TO QUIT SMOKING?  Talk with your healthcare provider about different strategies to quit smoking. Some strategies include:  Quitting smoking altogether instead of gradually lessening how much you smoke over a period of time. Research shows that quitting "cold [place]" is more successful than gradually quitting.  Attending in-person counseling to help you build problem-solving skills. You are more likely to have success in quitting if you attend several counseling sessions. Even short sessions of 10 minutes can be effective.  Finding resources and support systems that can help you to quit smoking and remain smoke-free after you quit. These resources are most helpful when you use them often. They can include:  Online chats with a Veterinary surgeon.  Telephone quitlines.  Printed Materials engineer.  Support groups or group counseling.  Text messaging programs.  Mobile phone applications.  Taking medicines to help you quit smoking. (If you are pregnant or breastfeeding, talk with your health care provider first.) Some medicines contain nicotine and some do not. Both types  of medicines help with cravings, but the medicines that include nicotine help to relieve withdrawal symptoms. Your health care provider may recommend:  Nicotine patches, gum, or lozenges.  Nicotine inhalers or sprays.  Non-nicotine medicine that is taken by mouth. Talk with your health care provider about combining strategies, such as taking medicines while you are also receiving in-person counseling. Using these two strategies together  makes you more likely to succeed in quitting than if you used either strategy on its own. If you are pregnant or breastfeeding, talk with your health care provider about finding counseling or other support strategies to quit smoking. Do not take medicine to help you quit smoking unless told to do so by your health care provider. WHAT THINGS CAN I DO TO MAKE IT EASIER TO QUIT? Quitting smoking might feel overwhelming at first, but there is a lot that you can do to make it easier. Take these important actions:  Reach out to your family and friends and ask that they support and encourage you during this time. Call telephone quitlines, reach out to support groups, or work with a counselor for support.  Ask people who smoke to avoid smoking around you.  Avoid places that trigger you to smoke, such as bars, parties, or smoke-break areas at work.  Spend time around people who do not smoke.  Lessen stress in your life, because stress can be a smoking trigger for some people. To lessen stress, try:  Exercising regularly.  Deep-breathing exercises.  Yoga.  Meditating.  Performing a body scan. This involves closing your eyes, scanning your body from head to toe, and noticing which parts of your body are particularly tense. Purposefully relax the muscles in those areas.  Download or purchase mobile phone or tablet apps (applications) that can help you stick to your quit plan by providing reminders, tips, and encouragement. There are many free apps, such as QuitGuide from the [employer] developer(Centers for Disease Control and Prevention). You can find other support for quitting smoking (smoking cessation) through smokefree.gov and other websites. HOW WILL I FEEL WHEN I QUIT SMOKING? Within the first 24 hours of quitting smoking, you may start to feel some withdrawal symptoms. These symptoms are usually most noticeable 2-3 days after quitting, but they usually do not last beyond 2-3 weeks. Changes or symptoms that  you might experience include:  Mood swings.  Restlessness, anxiety, or irritation.  Difficulty concentrating.  Dizziness.  Strong cravings for sugary foods in addition to nicotine.  Mild weight gain.  Constipation.  Nausea.  Coughing or a sore throat.  Changes in how your medicines work in your body.  A depressed mood.  Difficulty sleeping (insomnia). After the first 2-3 weeks of quitting, you may start to notice more positive results, such as:  Improved sense of smell and taste.  Decreased coughing and sore throat.  Slower heart rate.  Lower blood pressure.  Clearer skin.  The ability to breathe more easily.  Fewer sick days. Quitting smoking is very challenging for most people. Do not get discouraged if you are not successful the first time. Some people need to make many attempts to quit before they achieve long-term success. Do your best to stick to your quit plan, and talk with your health care provider if you have any questions or concerns.   This information is not intended to replace advice given to you by your health care provider. Make sure you discuss any questions you have with your health care provider.  Document Released: 06/06/2001 Document Revised: 10/27/2014 Document Reviewed: 10/27/2014 Elsevier Interactive Patient Education [employer]

## 2015-09-23 NOTE — Progress Notes (Signed)
Pre visit review using our clinic review tool, if applicable. No additional management support is needed unless otherwise documented below in the visit note. 

## 2015-09-23 NOTE — Assessment & Plan Note (Addendum)
Tdap 2015 prevnar- 2016 Pneumonia shot 2014 shingles shot discussed before , not covered by his insurance   12-2003: Cscope , TICs, hyperplastic polyps  cscope 08-2013, no polyps, 10 years  H/o elevated PSA, had a  urology evaluation 2011; he is asx, DRE (-) today, check a PSA   Ready to quit tobacco, intolerant to the patch but we talk about using the gum and he is willing to. Option of Wellbutrin and Chantix discuss, elected Chantix ---> prescription, how it works and side effects discussed.  Labs: CMP, FLP, CBC, A1c, TSH, PSA

## 2015-09-23 NOTE — Progress Notes (Signed)
Subjective:    Patient ID: Alex Phillips, male    DOB: [birth year], 55 y.o.   MRN: [id number]  DOS:  09/23/2015 Type of visit - description : CPX Interval history: Ready to quit tobacco, wife is using the patch and she  quit 2 weeks ago    Review of Systems Constitutional: No fever. No chills. No unexplained wt changes. No unusual sweats  HEENT: No dental problems, no ear discharge, no facial swelling, no voice changes. No eye discharge, no eye  redness , no  intolerance to light   Respiratory: No wheezing , no  difficulty breathing. No cough , no mucus production  Cardiovascular: No CP, no leg swelling , no  Palpitations  GI: no nausea, no vomiting, no diarrhea , no  abdominal pain.  No blood in the stools. No dysphagia, no odynophagia    Endocrine: No polyphagia, no polyuria , no polydipsia  GU: No dysuria, gross hematuria, difficulty urinating. No urinary urgency, no frequency.  Musculoskeletal: No joint swellings or unusual aches or pains  Skin: No change in the color of the skin, palor , no  Rash  Allergic, immunologic: No environmental allergies , no  food allergies  Neurological: No dizziness no  syncope. No headaches. No diplopia, no slurred, no slurred speech, no motor deficits, no facial  Numbness  Hematological: No enlarged lymph nodes, no easy bruising , no unusual bleedings  Psychiatry: No suicidal ideas, no hallucinations, no beavior problems, no confusion.  No unusual/severe anxiety, no depression    Past Medical History  Diagnosis Date  . Hypertension   . Diabetes mellitus 11/09    A1c- 6.2  . Hypothyroidism   . Hyperlipidemia   . DJD (degenerative joint disease)     l knee  . Bell's palsy   . Elevated WBC count     saw Dr.Enenever; likely benign, monitor  . EKG, abnormal 12-2007    Stress test (-)   . Elevated PSA 2010    saw urology, PSA was reched and found to be normal. Bx was canceled, did get a u/s d/t michrohematuris- normal  . HTN  (hypertension) 11/14/2006    Past Surgical History  Procedure Laterality Date  . Tonsillectomy and adenoidectomy    . Tympanostomy tube placement      B as a child  . Vasectomy  02/2009  . Mouth surgery      crown extention on right upper     Social History   Social History  . Marital Status: Married    Spouse Name: N/A  . Number of Children: 2  . Years of Education: N/A   Occupational History  . Designer, industrial/product     Social History Main Topics  . Smoking status: Current Every Day Smoker -- 1.50 packs/day for 34 years    Types: Cigarettes    Start date: 08/30/1981  . Smokeless tobacco: Never Used     Comment: 12-15   still smoking  . Alcohol Use: No     Comment: rare  . Drug Use: No  . Sexual Activity: Not on file   Other Topics Concern  . 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 Adopted: Yes  .  Colon cancer Neg Hx   . Stomach cancer Neg Hx       Medication List       This list is accurate as of: 09/23/15  4:59 PM.  Always use your most recent med list.               amLODipine 10 MG tablet  Commonly known as:  NORVASC  Take 1 tablet (10 mg total) by mouth daily.     aspirin 81 MG tablet  Take 81 mg by mouth daily.     labetalol 300 MG tablet  Commonly known as:  NORMODYNE  Take 1 tablet (300 mg total) by mouth 2 (two) times daily.     levothyroxine 100 MCG tablet  Commonly known as:  SYNTHROID, LEVOTHROID  Take 1 tablet (100 mcg total) by mouth daily before breakfast.     losartan-hydrochlorothiazide 100-25 MG tablet  Commonly known as:  HYZAAR  Take 1 tablet by mouth daily.     rosuvastatin 40 MG tablet  Commonly known as:  CRESTOR  Take 1 tablet (40 mg total) by mouth daily.     varenicline 0.5 MG X 11 & 1 MG X 42  tablet  Commonly known as:  CHANTIX STARTING MONTH PAK  Take one 0.5 mg tablet by mouth once daily for 3 days, then increase to one 0.5 mg tablet twice daily for 4 days, then increase to one 1 mg tablet twice daily.     varenicline 1 MG tablet  Commonly known as:  CHANTIX CONTINUING MONTH PAK  Take 1 tablet (1 mg total) by mouth 2 (two) times daily.           Objective:   Physical Exam BP 120/70 mmHg  Pulse 65  Temp(Src) 97.5 F (36.4 C) (Oral)  Ht 5\' 11"  (1.803 m)  Wt 218 lb (98.884 kg)  BMI 30.42 kg/m2  SpO2 96%  General:   Well developed, well nourished . NAD.  Neck: No  Thyromegaly  HEENT:  Normocephalic . Face symmetric, atraumatic Lungs:  CTA B Normal respiratory effort, no intercostal retractions, no accessory muscle use. Heart: RRR,  no murmur.  No pretibial edema bilaterally  Abdomen:  Not distended, soft, non-tender. No rebound or rigidity.  Rectal:  External abnormalities: none. Normal sphincter tone. No rectal masses or tenderness.  No stools Prostate:  Prostate gland firm and smooth, no enlargement, nodularity, tenderness, mass, asymmetry or induration.   Skin: Exposed areas without rash. Not pale. Not jaundice Neurologic:  alert & oriented X3.  Speech normal, gait appropriate for age and unassisted Strength symmetric and appropriate for age.  Psych: Cognition and judgment appear intact.  Cooperative with normal attention span and concentration.  Behavior appropriate. No anxious or depressed appearing.    Assessment & Plan:   Assessment  DM HTN Hyperlipidemia Hypothyroidism DJD Bell's palsy Leukocytosis, benign per GI, monitoring Abnormal EKG 2009  PLAN: Here for a CPX, good compliance with medication, appropriate labs for chronic medical problems will be drawn, not taking potassium supplements but is following a high potassium diet RTC 4 months

## 2015-09-23 NOTE — Assessment & Plan Note (Signed)
Here for a CPX, good compliance with medication, appropriate labs for chronic medical problems will be drawn, not taking potassium supplements but is following a high potassium diet RTC 4 months

## 2015-09-24 ENCOUNTER — Other Ambulatory Visit (INDEPENDENT_AMBULATORY_CARE_PROVIDER_SITE_OTHER): Payer: Managed Care, Other (non HMO)

## 2015-09-24 DIAGNOSIS — Z125 Encounter for screening for malignant neoplasm of prostate: Secondary | ICD-10-CM

## 2015-09-24 DIAGNOSIS — E119 Type 2 diabetes mellitus without complications: Secondary | ICD-10-CM | POA: Diagnosis not present

## 2015-09-24 DIAGNOSIS — Z0000 Encounter for general adult medical examination without abnormal findings: Secondary | ICD-10-CM | POA: Diagnosis not present

## 2015-09-24 LAB — CBC WITH DIFFERENTIAL/PLATELET
BASOS ABS: 0.1 10*3/uL (ref 0.0–0.1)
Basophils Relative: 0.6 % (ref 0.0–3.0)
EOS ABS: 0.5 10*3/uL (ref 0.0–0.7)
Eosinophils Relative: 3.2 % (ref 0.0–5.0)
HCT: 47.6 % (ref 39.0–52.0)
HEMOGLOBIN: 16.1 g/dL (ref 13.0–17.0)
LYMPHS ABS: 3 10*3/uL (ref 0.7–4.0)
Lymphocytes Relative: 21.3 % (ref 12.0–46.0)
MCHC: 33.7 g/dL (ref 30.0–36.0)
MCV: 95.4 fl (ref 78.0–100.0)
MONO ABS: 0.9 10*3/uL (ref 0.1–1.0)
Monocytes Relative: 6.7 % (ref 3.0–12.0)
NEUTROS PCT: 68.2 % (ref 43.0–77.0)
Neutro Abs: 9.5 10*3/uL — ABNORMAL HIGH (ref 1.4–7.7)
Platelets: 177 10*3/uL (ref 150.0–400.0)
RBC: 4.99 Mil/uL (ref 4.22–5.81)
RDW: 12.9 % (ref 11.5–15.5)
WBC: 13.9 10*3/uL — AB (ref 4.0–10.5)

## 2015-09-24 LAB — COMPREHENSIVE METABOLIC PANEL
ALT: 18 U/L (ref 0–53)
AST: 15 U/L (ref 0–37)
Albumin: 4.3 g/dL (ref 3.5–5.2)
Alkaline Phosphatase: 56 U/L (ref 39–117)
BUN: 14 mg/dL (ref 6–23)
CHLORIDE: 100 meq/L (ref 96–112)
CO2: 32 mEq/L (ref 19–32)
Calcium: 9.5 mg/dL (ref 8.4–10.5)
Creatinine, Ser: 1.17 mg/dL (ref 0.40–1.50)
GFR: 68.97 mL/min (ref >60.00)
GLUCOSE: 137 mg/dL — AB (ref 70–99)
POTASSIUM: 3.4 meq/L — AB (ref 3.5–5.1)
SODIUM: 139 meq/L (ref 135–145)
Total Bilirubin: 0.3 mg/dL (ref 0.2–1.2)
Total Protein: 6.8 g/dL (ref 6.0–8.3)

## 2015-09-24 LAB — LIPID PANEL
CHOL/HDL RATIO: 4
Cholesterol: 148 mg/dL (ref 0–200)
HDL: 35.4 mg/dL — AB (ref >39.00)
LDL CALC: 77 mg/dL (ref 0–99)
NONHDL: 112.41
Triglycerides: 175 mg/dL — ABNORMAL HIGH (ref 0.0–149.0)
VLDL: 35 mg/dL (ref 0.0–40.0)

## 2015-09-24 LAB — TSH: TSH: 3.28 u[IU]/mL (ref 0.35–4.50)

## 2015-09-24 LAB — PSA: PSA: 1.11 ng/mL (ref 0.10–4.00)

## 2015-09-24 LAB — HEMOGLOBIN A1C: HEMOGLOBIN A1C: 7.6 % — AB (ref 4.6–6.5)

## 2015-09-27 MED ORDER — METFORMIN HCL 850 MG PO TABS: 850.0000 mg | ORAL_TABLET | Freq: Two times a day (BID) | ORAL | Status: DC

## 2015-09-27 NOTE — Addendum Note (Signed)
Addended byConrad Assumption: Leola Fiore D on: 09/27/2015 11:50 AM   Modules accepted: Orders

## 2015-11-18 ENCOUNTER — Other Ambulatory Visit: Payer: Self-pay | Admitting: Internal Medicine

## 2015-11-19 ENCOUNTER — Other Ambulatory Visit: Payer: Self-pay | Admitting: Internal Medicine

## 2016-01-03 ENCOUNTER — Other Ambulatory Visit: Payer: Self-pay

## 2016-01-03 MED ORDER — METFORMIN HCL 850 MG PO TABS: 850.0000 mg | ORAL_TABLET | Freq: Two times a day (BID) | ORAL | Status: DC

## 2016-01-05 ENCOUNTER — Telehealth: Payer: Self-pay | Admitting: Internal Medicine

## 2016-01-11 NOTE — Telephone Encounter (Signed)
Completed.

## 2016-01-18 ENCOUNTER — Ambulatory Visit: Payer: Managed Care, Other (non HMO) | Admitting: Internal Medicine

## 2016-04-02 ENCOUNTER — Other Ambulatory Visit: Payer: Self-pay | Admitting: Internal Medicine

## 2016-04-18 ENCOUNTER — Encounter: Payer: Self-pay | Admitting: Internal Medicine

## 2016-04-18 ENCOUNTER — Ambulatory Visit (INDEPENDENT_AMBULATORY_CARE_PROVIDER_SITE_OTHER): Payer: Managed Care, Other (non HMO) | Admitting: Internal Medicine

## 2016-04-18 VITALS — BP 124/76 | HR 68 | Temp 98.1°F | Resp 14 | Ht 71.0 in | Wt 228.0 lb

## 2016-04-18 DIAGNOSIS — I10 Essential (primary) hypertension: Secondary | ICD-10-CM

## 2016-04-18 DIAGNOSIS — F17200 Nicotine dependence, unspecified, uncomplicated: Secondary | ICD-10-CM | POA: Diagnosis not present

## 2016-04-18 DIAGNOSIS — E119 Type 2 diabetes mellitus without complications: Secondary | ICD-10-CM

## 2016-04-18 DIAGNOSIS — E033 Postinfectious hypothyroidism: Secondary | ICD-10-CM

## 2016-04-18 LAB — BASIC METABOLIC PANEL
BUN: 19 mg/dL (ref 6–23)
CALCIUM: 9.6 mg/dL (ref 8.4–10.5)
CHLORIDE: 100 meq/L (ref 96–112)
CO2: 29 meq/L (ref 19–32)
Creatinine, Ser: 1.25 mg/dL (ref 0.40–1.50)
GFR: 63.77 mL/min (ref >60.00)
GLUCOSE: 169 mg/dL — AB (ref 70–99)
POTASSIUM: 3.9 meq/L (ref 3.5–5.1)
SODIUM: 138 meq/L (ref 135–145)

## 2016-04-18 LAB — HEMOGLOBIN A1C: HEMOGLOBIN A1C: 7.8 % — AB (ref 4.6–6.5)

## 2016-04-18 LAB — ALT: ALT: 42 U/L (ref 0–53)

## 2016-04-18 LAB — AST: AST: 23 U/L (ref 0–37)

## 2016-04-18 LAB — TSH: TSH: 13.9 u[IU]/mL — AB (ref 0.35–4.50)

## 2016-04-18 NOTE — Patient Instructions (Signed)
GO TO THE LAB : Get the blood work     GO TO THE FRONT DESK Schedule your next appointment for a  routine checkup in 3 months    

## 2016-04-18 NOTE — Progress Notes (Signed)
Subjective:    Patient ID: Alex Phillips, male    DOB: 08-Jul-1960, 55 y.o.   MRN: [id number]  DOS:  04/18/2016 Type of visit - description : rov, Here with his wife Interval history: Tobacco abuse: He quit tobacco a few months ago!. Has gained some weight. Diabetes: Good med compliance, no ambulatory CBGs  hypothyroidism: Good med compliance. High cholesterol: Good compliance of medication.   Review of Systems Denies chest pain or difficulty breathing No nausea, vomiting, diarrhea. No lower extremity paresthesia or numbness  Past Medical History:  Diagnosis Date  . Bell's palsy   . Diabetes mellitus 11/09   A1c- 6.2  . DJD (degenerative joint disease)    l knee  . EKG, abnormal 12-2007   Stress test (-)   . Elevated PSA 2010   saw urology, PSA was reched and found to be normal. Bx was canceled, did get a u/s d/t michrohematuris- normal  . Elevated WBC count    saw Dr.Enenever; likely benign, monitor  . HTN (hypertension) 11/14/2006  . Hyperlipidemia   . Hypertension   . Hypothyroidism     Past Surgical History:  Procedure Laterality Date  . MOUTH SURGERY     crown extention on right upper   . TONSILLECTOMY AND ADENOIDECTOMY    . TYMPANOSTOMY TUBE PLACEMENT     B as a child  . VASECTOMY  02/2009    Social History   Social History  . Marital status: Married    Spouse name: N/A  . Number of children: 2  . Years of education: N/A   Occupational History  . Designer, industrial/product     Social History Main Topics  . Smoking status: Former Smoker    Packs/day: 1.50    Years: 34.00    Types: Cigarettes    Start date: 08/30/1981    Quit date: 10/25/2015  . Smokeless tobacco: Never Used  . Alcohol use No     Comment: rare  . Drug use: No  . Sexual activity: Not on file   Other Topics Concern  . 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 Always use your most recent med list.          amLODipine 10 MG tablet Commonly known as:  NORVASC Take 1 tablet (10 mg total) by mouth daily.   aspirin 81 MG tablet Take 81 mg by mouth daily.   labetalol 300 MG tablet Commonly known as:  NORMODYNE Take 1 tablet (300 mg total) by mouth 2 (two) times daily.   levothyroxine 100 MCG tablet Commonly known as:  SYNTHROID, LEVOTHROID Take 1 tablet (100 mcg total) by mouth daily before breakfast.   losartan-hydrochlorothiazide 100-25 MG tablet Commonly known as:  HYZAAR Take 1 tablet by mouth daily.   metFORMIN 850 MG tablet Commonly known as:  GLUCOPHAGE TAKE 1 TABLET TWICE A DAY WITH MEALS   rosuvastatin 40 MG tablet Commonly known as:  CRESTOR Take 1 tablet (40 mg total) by mouth daily.          Objective:   Physical Exam BP 124/76 (BP Location: Left Arm, Patient Position: Sitting, Cuff Size: Normal)   Pulse  68   Temp 98.1 F (36.7 C) (Oral)   Resp 14   Ht 5\' 11"  (1.803 m)   Wt 228 lb (103.4 kg)   SpO2 96%   BMI 31.80 kg/m  General:   Well developed, well nourished . NAD.  HEENT:  Normocephalic . Face symmetric, atraumatic Lungs:  CTA B Normal respiratory effort, no intercostal retractions, no accessory muscle use. Heart: RRR,  no murmur.  No pretibial edema bilaterally  DIABETIC FEET EXAM: No lower extremity edema Normal pedal pulses bilaterally Skin normal, nails normal, no calluses Pinprick examination of the feet normal. Neurologic:  alert & oriented X3.  Speech normal, gait appropriate for age and unassisted Psych--  Cognition and judgment appear intact.  Cooperative with normal attention span and concentration.  Behavior appropriate. No anxious or depressed appearing.      Assessment & Plan:   Assessment  DM HTN Hyperlipidemia Hypothyroidism DJD Bell's  palsy Leukocytosis, benign per GI, monitoring Abnormal EKG 2009 Quit tobacco  ~ 10/2015  PLAN: Tobacco abuse: Tried Chantix for a few days, had mood changes, self dc it. Nevertheless he was able to quit tobacco. Using nicotine gums. Has gained some weight. DM: Last A1c elevated, was prescribed metformin, good med compliance w/o s/e. Will recheck A1c, AST, ALT. If not at goal,r Victoza or invokana? ( may help him lose weight). He will consider dose medications. He was also counseled about diet and increase physical activity. Feet exam negative today HTN: Continue labetalol, amlodipine, Hyzaar, check a BMP Hypothyroidism: Continue Synthroid 100 mcg daily, last TSH 3.28. Consider increase Synthroid dose to get the TSH around 1.0. rtc 3 months

## 2016-04-18 NOTE — Progress Notes (Signed)
Pre visit review using our clinic review tool, if applicable. No additional management support is needed unless otherwise documented below in the visit note. 

## 2016-04-19 NOTE — Assessment & Plan Note (Signed)
Tobacco abuse: Tried Chantix for a few days, had mood changes, self dc it. Nevertheless he was able to quit tobacco. Using nicotine gums. Has gained some weight. DM: Last A1c elevated, was prescribed metformin, good med compliance w/o s/e. Will recheck A1c, AST, ALT. If not at goal,r Victoza or invokana? ( may help him lose weight). He will consider dose medications. He was also counseled about diet and increase physical activity. Feet exam negative today HTN: Continue labetalol, amlodipine, Hyzaar, check a BMP Hypothyroidism: Continue Synthroid 100 mcg daily, last TSH 3.28. Consider increase Synthroid dose to get the TSH around 1.0. rtc 3 months

## 2016-04-26 MED ORDER — LEVOTHYROXINE SODIUM 125 MCG PO TABS: 125.0000 ug | ORAL_TABLET | Freq: Every day | ORAL | 1 refills | Status: DC

## 2016-04-26 NOTE — Addendum Note (Signed)
Addended byConrad [place]: Aarilyn Dye D on: 04/26/2016 04:54 PM   Modules accepted: Orders

## 2016-05-13 ENCOUNTER — Other Ambulatory Visit: Payer: Self-pay | Admitting: Internal Medicine

## 2016-06-05 ENCOUNTER — Other Ambulatory Visit (INDEPENDENT_AMBULATORY_CARE_PROVIDER_SITE_OTHER): Payer: Managed Care, Other (non HMO)

## 2016-06-05 DIAGNOSIS — E033 Postinfectious hypothyroidism: Secondary | ICD-10-CM

## 2016-06-05 LAB — TSH: TSH: 17.86 u[IU]/mL — ABNORMAL HIGH (ref 0.35–4.50)

## 2016-06-06 MED ORDER — LEVOTHYROXINE SODIUM 175 MCG PO TABS: 175.0000 ug | ORAL_TABLET | Freq: Every day | ORAL | 1 refills | Status: DC

## 2016-07-02 ENCOUNTER — Other Ambulatory Visit: Payer: Self-pay | Admitting: Internal Medicine

## 2016-07-18 ENCOUNTER — Other Ambulatory Visit: Payer: Managed Care, Other (non HMO)

## 2016-07-19 ENCOUNTER — Ambulatory Visit: Payer: Managed Care, Other (non HMO) | Admitting: Internal Medicine

## 2016-07-24 ENCOUNTER — Other Ambulatory Visit: Payer: Managed Care, Other (non HMO)

## 2016-07-25 ENCOUNTER — Ambulatory Visit: Payer: Managed Care, Other (non HMO) | Admitting: Internal Medicine

## 2016-08-02 ENCOUNTER — Other Ambulatory Visit (INDEPENDENT_AMBULATORY_CARE_PROVIDER_SITE_OTHER): Payer: Managed Care, Other (non HMO)

## 2016-08-02 DIAGNOSIS — E033 Postinfectious hypothyroidism: Secondary | ICD-10-CM | POA: Diagnosis not present

## 2016-08-02 LAB — TSH: TSH: 3.62 u[IU]/mL (ref 0.35–4.50)

## 2016-08-04 MED ORDER — LEVOTHYROXINE SODIUM 175 MCG PO TABS: 175.0000 ug | ORAL_TABLET | Freq: Every day | ORAL | 5 refills | Status: DC

## 2016-08-07 ENCOUNTER — Encounter: Payer: Self-pay | Admitting: Internal Medicine

## 2016-08-07 ENCOUNTER — Ambulatory Visit (INDEPENDENT_AMBULATORY_CARE_PROVIDER_SITE_OTHER): Payer: Managed Care, Other (non HMO) | Admitting: Internal Medicine

## 2016-08-07 VITALS — BP 128/80 | HR 71 | Temp 98.1°F | Resp 12 | Ht 71.0 in | Wt 225.4 lb

## 2016-08-07 DIAGNOSIS — E039 Hypothyroidism, unspecified: Secondary | ICD-10-CM

## 2016-08-07 DIAGNOSIS — E119 Type 2 diabetes mellitus without complications: Secondary | ICD-10-CM

## 2016-08-07 MED ORDER — LEVOTHYROXINE SODIUM 175 MCG PO TABS: 175.0000 ug | ORAL_TABLET | Freq: Every day | ORAL | 1 refills | Status: DC

## 2016-08-07 NOTE — Progress Notes (Signed)
Pre visit review using our clinic review tool, if applicable. No additional management support is needed unless otherwise documented below in the visit note. 

## 2016-08-07 NOTE — Assessment & Plan Note (Signed)
PLAN: DM: On metformin, no ambulatory CBGs, not doing well with lifestyle, extensive discussion about diet, exercise. Check a A1c and BMP   Hypothyroidism: Since the last time he was here, TSHs were checked twice and Synthroid subsequently adjusted. Last TSH satisfactory, refill meds Tobacco abuse: Still doing great!. Still using the gum Declined  a flu shot. RTC 4 months, CPX

## 2016-08-07 NOTE — Progress Notes (Signed)
Subjective:    Patient ID: Alex Phillips, male    DOB: 1960-08-17, 56 y.o.   MRN: [id number]  DOS:  08/07/2016 Type of visit - description : Routine checkup Interval history: DM: No doing well with diet and exercise, extremely busy at work. Hypothyroidism: Good med compliance Tobacco: Still free of tobacco, still chewing gum  Review of Systems No chest pain or difficulty breathing No nausea, vomiting, diarrhea  Past Medical History:  Diagnosis Date  . Bell's palsy   . Diabetes mellitus 11/09   A1c- 6.2  . DJD (degenerative joint disease)    l knee  . EKG, abnormal 12-2007   Stress test (-)   . Elevated PSA 2010   saw urology, PSA was reched and found to be normal. Bx was canceled, did get a u/s d/t michrohematuris- normal  . Elevated WBC count    saw Dr.Enenever; likely benign, monitor  . HTN (hypertension) 11/14/2006  . Hyperlipidemia   . Hypertension   . Hypothyroidism     Past Surgical History:  Procedure Laterality Date  . MOUTH SURGERY     crown extention on right upper   . TONSILLECTOMY AND ADENOIDECTOMY    . TYMPANOSTOMY TUBE PLACEMENT     B as a child  . VASECTOMY  02/2009    Social History   Social History  . Marital status: Married    Spouse name: N/A  . Number of children: 2  . Years of education: N/A   Occupational History  . Designer, industrial/productpecial Project manager     Social History Main Topics  . Smoking status: Former Smoker    Packs/day: 1.50    Years: 34.00    Types: Cigarettes    Start date: 08/30/1981    Quit date: 10/25/2015  . Smokeless tobacco: Never Used  . Alcohol use No     Comment: rare  . Drug use: No  . Sexual activity: Not on file   Other Topics Concern  . 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 Always use your most recent med list.          amLODipine 10 MG tablet Commonly known as:  NORVASC Take 1 tablet (10 mg total) by mouth daily.   aspirin 81 MG tablet Take 81 mg by mouth daily.   labetalol 300 MG tablet Commonly known as:  NORMODYNE Take 1 tablet (300 mg total) by mouth 2 (two) times daily.   levothyroxine 175 MCG tablet Commonly known as:  SYNTHROID Take 1 tablet (175 mcg total) by mouth daily before breakfast.   losartan-hydrochlorothiazide 100-25 MG tablet Commonly known as:  HYZAAR Take 1 tablet by mouth daily.   metFORMIN 850 MG tablet Commonly known as:  GLUCOPHAGE Take 1 tablet (850 mg total) by mouth 2 (two) times daily with a meal.   rosuvastatin 40 MG tablet Commonly known as:  CRESTOR Take 1 tablet (40 mg total) by mouth daily.  Objective:   Physical Exam BP 128/80 (BP Location: Left Arm, Patient Position: Sitting, Cuff Size: Normal)   Pulse 71   Temp 98.1 F (36.7 C) (Oral)   Resp 12   Ht 5\' 11"  (1.803 m)   Wt 225 lb 6 oz (102.2 kg)   SpO2 96%   BMI 31.43 kg/m  General:   Well developed, well nourished . NAD.  HEENT:  Normocephalic . Face symmetric, atraumatic Lungs:  CTA B Normal respiratory effort, no intercostal retractions, no accessory muscle use. Heart: RRR,  no murmur.  No pretibial edema bilaterally  Skin: Not pale. Not jaundice Neurologic:  alert & oriented X3.  Speech normal, gait appropriate for age and unassisted Psych--  Cognition and judgment appear intact.  Cooperative with normal attention span and concentration.  Behavior appropriate. No anxious or depressed appearing.      Assessment & Plan:  Assessment  DM HTN Hyperlipidemia Hypothyroidism DJD Bell's palsy Leukocytosis, benign per Hem,  monitoring Abnormal EKG 2009 Quit tobacco  ~ 10/2015  PLAN: DM: On metformin, no ambulatory CBGs, not doing well with lifestyle, extensive discussion about diet, exercise. Check a A1c and BMP   Hypothyroidism: Since the last time he was here, TSHs were checked twice and Synthroid subsequently adjusted. Last TSH satisfactory, refill meds Tobacco abuse: Still doing great!. Still using the gum Declined  a flu shot. RTC 4 months, CPX

## 2016-08-07 NOTE — Patient Instructions (Addendum)
GO TO THE LAB : Get the blood work     GO TO THE FRONT DESK Schedule your next appointment for a  Physical in 4 months

## 2016-08-08 LAB — BASIC METABOLIC PANEL
BUN: 11 mg/dL (ref 6–23)
CO2: 27 mEq/L (ref 19–32)
CREATININE: 1.11 mg/dL (ref 0.40–1.50)
Calcium: 9.3 mg/dL (ref 8.4–10.5)
Chloride: 100 mEq/L (ref 96–112)
GFR: 73.05 mL/min (ref >60.00)
GLUCOSE: 160 mg/dL — AB (ref 70–99)
Potassium: 4.1 mEq/L (ref 3.5–5.1)
Sodium: 136 mEq/L (ref 135–145)

## 2016-08-08 LAB — HEMOGLOBIN A1C: Hgb A1c MFr Bld: 9.7 % — ABNORMAL HIGH (ref 4.6–6.5)

## 2016-08-11 MED ORDER — SITAGLIPTIN PHOSPHATE 100 MG PO TABS: 100.0000 mg | ORAL_TABLET | Freq: Every day | ORAL | 1 refills | Status: DC

## 2016-08-11 MED ORDER — METFORMIN HCL 850 MG PO TABS: ORAL_TABLET | ORAL | 0 refills | Status: DC

## 2016-08-11 MED ORDER — METFORMIN HCL 850 MG PO TABS: ORAL_TABLET | ORAL | 1 refills | Status: DC

## 2016-08-11 MED ORDER — SITAGLIPTIN PHOSPHATE 100 MG PO TABS: 100.0000 mg | ORAL_TABLET | Freq: Every day | ORAL | 0 refills | Status: DC

## 2016-08-11 NOTE — Addendum Note (Signed)
Addended byConrad Cody: Amra Shukla D on: 08/11/2016 02:39 PM   Modules accepted: Orders

## 2016-08-14 ENCOUNTER — Other Ambulatory Visit: Payer: Self-pay | Admitting: Internal Medicine

## 2016-11-11 ENCOUNTER — Other Ambulatory Visit: Payer: Self-pay | Admitting: Internal Medicine

## 2016-12-18 ENCOUNTER — Ambulatory Visit (INDEPENDENT_AMBULATORY_CARE_PROVIDER_SITE_OTHER): Payer: Managed Care, Other (non HMO) | Admitting: Internal Medicine

## 2016-12-18 ENCOUNTER — Encounter: Payer: Self-pay | Admitting: Internal Medicine

## 2016-12-18 VITALS — BP 126/70 | HR 79 | Temp 97.9°F | Resp 14 | Ht 71.0 in | Wt 215.2 lb

## 2016-12-18 DIAGNOSIS — Z0000 Encounter for general adult medical examination without abnormal findings: Secondary | ICD-10-CM

## 2016-12-18 DIAGNOSIS — E119 Type 2 diabetes mellitus without complications: Secondary | ICD-10-CM | POA: Diagnosis not present

## 2016-12-18 DIAGNOSIS — E1165 Type 2 diabetes mellitus with hyperglycemia: Secondary | ICD-10-CM

## 2016-12-18 NOTE — Assessment & Plan Note (Addendum)
DM: currently on metformin, self d/c [place] d/t a rash, not clear to me if ras was related to [place]; plan: labs, cont metformin, add actos? Victoza? (he is ok w/ injectables), tradjenta? Hypothyroidism- labs  Rash, LE: getting better per pt, observation Sun skin damage: pt's wife concerned, pt somewhat reluctant to see derm as I recommended; has a scaly area at the L forehead, doesn't know x how long; we agreed to monitor the area, call if persistent scalines  Still tobacco free! RTC 3-4 months

## 2016-12-18 NOTE — Patient Instructions (Signed)
GO TO THE LAB : Get the blood work     GO TO THE FRONT DESK Schedule your next appointment for a  Check up in 3-4 months  

## 2016-12-18 NOTE — Progress Notes (Signed)
Pre visit review using our clinic review tool, if applicable. No additional management support is needed unless otherwise documented below in the visit note. 

## 2016-12-18 NOTE — Progress Notes (Signed)
Subjective:    Patient ID: Alex Phillips, male    DOB: Sep 18, 1960, 56 y.o.   MRN: [id number]  DOS:  12/18/2016 Type of visit - description : CPX Interval history: Since the last OV tried Januvia, took it x 3 days, developed a symmetric rash, very pruritic between knees and ankles, self d/c januvia 3 days later, rash gradually resolving but not completely gone  At the time denies generalize pruritus, lip-tonge swelling, SOB-wheezing   Review of Systems  See above  Past Medical History:  Diagnosis Date  . Bell's palsy   . Diabetes mellitus 11/09   A1c- 6.2  . DJD (degenerative joint disease)    l knee  . EKG, abnormal 12-2007   Stress test (-)   . Elevated PSA 2010   saw urology, PSA was reched and found to be normal. Bx was canceled, did get a u/s d/t michrohematuris- normal  . Elevated WBC count    saw Dr.Enenever; likely benign, monitor  . HTN (hypertension) 11/14/2006  . Hyperlipidemia   . Hypertension   . Hypothyroidism     Past Surgical History:  Procedure Laterality Date  . MOUTH SURGERY     crown extention on right upper   . TONSILLECTOMY AND ADENOIDECTOMY    . TYMPANOSTOMY TUBE PLACEMENT     B as a child  . VASECTOMY  02/2009    Social History   Social History  . Marital status: Married    Spouse name: N/A  . Number of children: 2  . Years of education: N/A   Occupational History  . Designer, industrial/productpecial Project manager     Social History Main Topics  . Smoking status: Former Smoker    Packs/day: 1.50    Years: 34.00    Types: Cigarettes    Start date: 08/30/1981    Quit date: 10/25/2015  . Smokeless tobacco: Never Used  . Alcohol use No     Comment: rare  . Drug use: No  . Sexual activity: Not on file   Other Topics Concern  . 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 Questionable, see OV 12-18-16      Medication List       Accurate as of 12/18/16  1:17 PM. Always use your most recent med list.          amLODipine 10 MG tablet Commonly known as:  NORVASC Take 1 tablet (10 mg total) by mouth daily.   aspirin 81 MG tablet Take 81 mg by mouth daily.   labetalol 300 MG tablet Commonly known as:  NORMODYNE Take 1 tablet (300 mg total) by mouth 2 (two) times daily.   levothyroxine 175 MCG tablet Commonly known as:  SYNTHROID Take 1 tablet (175 mcg total) by mouth daily before breakfast.   losartan-hydrochlorothiazide 100-25 MG tablet Commonly known as:  HYZAAR Take 1 tablet by mouth daily.   metFORMIN 850 MG tablet Commonly known as:  GLUCOPHAGE Take 2 tablets by mouth daily with breakfast and 1  tablet by mouth daily with dinner.   rosuvastatin 40 MG tablet Commonly known as:  CRESTOR Take 1 tablet (40 mg total) by mouth daily.          Objective:   Physical Exam BP 126/70 (BP Location: Left Arm, Patient Position: Sitting, Cuff Size: Normal)   Pulse 79   Temp 97.9 F (36.6 C) (Oral)   Resp 14   Ht 5\' 11"  (1.803 m)   Wt 215 lb 4 oz (97.6 kg)   SpO2 96%   BMI 30.02 kg/m  General:   Well developed, well nourished . NAD.  Neck: No  thyromegaly  HEENT:  Normocephalic . Face symmetric, atraumatic Lungs:  CTA B Normal respiratory effort, no intercostal retractions, no accessory muscle use. Heart: RRR,  no murmur.  No pretibial edema bilaterally  Abdomen:  Not distended, soft, non-tender. No rebound or rigidity.   Skin:  --see pictures, both legs w/ macular slt red-dry lesions --face- does have sun skin-damage Neurologic:  alert & oriented X3.  Speech normal, gait appropriate for age and  unassisted Psych: Cognition and judgment appear intact.  Cooperative with normal attention span and concentration.  Behavior appropriate. No anxious or depressed appearing.          Assessment & Plan:    Assessment  DM HTN Hyperlipidemia Hypothyroidism DJD Bell's palsy Leukocytosis, benign per Hem, monitoring Abnormal EKG 2009 Quit tobacco  ~ 10/2015  PLAN: DM: currently on metformin, self d/c [place] d/t a rash, not clear to me if ras was related to [place]; plan: labs, cont metformin, add actos? Victoza? (he is ok w/ injectables), tradjenta? Hypothyroidism- labs  Rash, LE: getting better per pt, observation Sun skin damage: pt's wife concerned, pt somewhat reluctant to see derm as I recommended; has a scaly area at the L forehead, doesn't know x how long; we agreed to monitor the area, call if persistent scalines  Still tobacco free! RTC 3-4 months

## 2016-12-18 NOTE — Assessment & Plan Note (Addendum)
--  Tdap 2015;  prevnar- 2016;  Pneumonia shot 2014;  shingles shot discussed before -- CCS 12-2003: Cscope , TICs, hyperplastic polyps  cscope 08-2013, no polyps, 10 years --H/o elevated PSA, had a  urology evaluation 2011; he is asx, DRE / PSA wnl  08/2015-- pt education: discussed diet, exercise -- labs: will RTC fasting; CMP FLP CBC A1C TSH

## 2016-12-19 ENCOUNTER — Other Ambulatory Visit (INDEPENDENT_AMBULATORY_CARE_PROVIDER_SITE_OTHER): Payer: Managed Care, Other (non HMO)

## 2016-12-19 DIAGNOSIS — Z0000 Encounter for general adult medical examination without abnormal findings: Secondary | ICD-10-CM | POA: Diagnosis not present

## 2016-12-19 DIAGNOSIS — E1165 Type 2 diabetes mellitus with hyperglycemia: Secondary | ICD-10-CM

## 2016-12-19 LAB — COMPREHENSIVE METABOLIC PANEL
ALBUMIN: 4.3 g/dL (ref 3.5–5.2)
ALK PHOS: 62 U/L (ref 39–117)
ALT: 22 U/L (ref 0–53)
AST: 19 U/L (ref 0–37)
BILIRUBIN TOTAL: 0.3 mg/dL (ref 0.2–1.2)
BUN: 9 mg/dL (ref 6–23)
CO2: 31 mEq/L (ref 19–32)
Calcium: 9.7 mg/dL (ref 8.4–10.5)
Chloride: 100 mEq/L (ref 96–112)
Creatinine, Ser: 1.13 mg/dL (ref 0.40–1.50)
GFR: 71.47 mL/min (ref >60.00)
GLUCOSE: 155 mg/dL — AB (ref 70–99)
POTASSIUM: 3.8 meq/L (ref 3.5–5.1)
SODIUM: 138 meq/L (ref 135–145)
TOTAL PROTEIN: 7.1 g/dL (ref 6.0–8.3)

## 2016-12-19 LAB — LIPID PANEL
CHOLESTEROL: 102 mg/dL (ref 0–200)
HDL: 29.2 mg/dL — ABNORMAL LOW (ref >39.00)
LDL Cholesterol: 40 mg/dL (ref 0–99)
NONHDL: 72.94
Total CHOL/HDL Ratio: 3
Triglycerides: 164 mg/dL — ABNORMAL HIGH (ref 0.0–149.0)
VLDL: 32.8 mg/dL (ref 0.0–40.0)

## 2016-12-19 LAB — CBC WITH DIFFERENTIAL/PLATELET
BASOS PCT: 1.4 % (ref 0.0–3.0)
Basophils Absolute: 0.1 10*3/uL (ref 0.0–0.1)
EOS PCT: 3.1 % (ref 0.0–5.0)
Eosinophils Absolute: 0.3 10*3/uL (ref 0.0–0.7)
HEMATOCRIT: 45.9 % (ref 39.0–52.0)
HEMOGLOBIN: 15.8 g/dL (ref 13.0–17.0)
Lymphocytes Relative: 17.9 % (ref 12.0–46.0)
Lymphs Abs: 1.8 10*3/uL (ref 0.7–4.0)
MCHC: 34.3 g/dL (ref 30.0–36.0)
MCV: 93.1 fl (ref 78.0–100.0)
MONOS PCT: 7.1 % (ref 3.0–12.0)
Monocytes Absolute: 0.7 10*3/uL (ref 0.1–1.0)
Neutro Abs: 7.1 10*3/uL (ref 1.4–7.7)
Neutrophils Relative %: 70.5 % (ref 43.0–77.0)
Platelets: 192 10*3/uL (ref 150.0–400.0)
RBC: 4.94 Mil/uL (ref 4.22–5.81)
RDW: 13.7 % (ref 11.5–15.5)
WBC: 10.1 10*3/uL (ref 4.0–10.5)

## 2016-12-19 LAB — HEMOGLOBIN A1C: Hgb A1c MFr Bld: 9 % — ABNORMAL HIGH (ref 4.6–6.5)

## 2016-12-19 LAB — TSH: TSH: 1.41 u[IU]/mL (ref 0.35–4.50)

## 2016-12-21 ENCOUNTER — Telehealth: Payer: Self-pay

## 2016-12-21 MED ORDER — LIRAGLUTIDE 18 MG/3ML ~~LOC~~ SOPN: PEN_INJECTOR | SUBCUTANEOUS | 3 refills | Status: DC

## 2016-12-21 MED ORDER — INSULIN PEN NEEDLE 32G X 6 MM MISC: 1 refills | Status: DC

## 2016-12-21 NOTE — Telephone Encounter (Signed)
Spoke w/ Pt, nurse visit for Victoza teaching scheduled for Tuesday, December 26, 2016 at 0945, Pt to bring medication with him to appt.

## 2016-12-21 NOTE — Telephone Encounter (Signed)
PA initiated via Covermymeds; KEY: [id number]. Received real time PA approval.   CaseId:[id number];Status:Approved;Review Type:Prior Auth;Coverage Start Date:11/21/2016;Coverage End Date:12/21/2018

## 2016-12-21 NOTE — Addendum Note (Signed)
Addended byConrad [place]: Archimedes Harold D on: 12/21/2016 10:02 AM   Modules accepted: Orders

## 2016-12-26 ENCOUNTER — Ambulatory Visit: Payer: Managed Care, Other (non HMO)

## 2016-12-26 DIAGNOSIS — E1165 Type 2 diabetes mellitus with hyperglycemia: Secondary | ICD-10-CM

## 2016-12-26 NOTE — Progress Notes (Signed)
Pre visit review using our clinic tool,if applicable. No additional management support is needed unless otherwise documented below in the visit note.   Patient in office today per orders from Dr. Willow OraJose Paz regarding instructions on who to use Victoza Pen.   Instructions given to patient then requested return demonstration on which patient completed successfully. Asked patient if he had any questions and his reply was not at this time.  Advised patient to choose a different location each time and to make sure he disposed of pen needle after each injection. Patient agreed. Advised patient to call office with any questions that may arise.

## 2017-01-23 ENCOUNTER — Other Ambulatory Visit: Payer: Self-pay | Admitting: Internal Medicine

## 2017-02-07 ENCOUNTER — Other Ambulatory Visit: Payer: Self-pay | Admitting: Internal Medicine

## 2017-02-10 ENCOUNTER — Other Ambulatory Visit: Payer: Self-pay | Admitting: Internal Medicine

## 2017-02-12 ENCOUNTER — Other Ambulatory Visit: Payer: Self-pay | Admitting: Internal Medicine

## 2017-03-15 ENCOUNTER — Other Ambulatory Visit: Payer: Self-pay

## 2017-03-15 MED ORDER — METFORMIN HCL 850 MG PO TABS: ORAL_TABLET | ORAL | 1 refills | Status: DC

## 2017-03-23 ENCOUNTER — Ambulatory Visit: Payer: Managed Care, Other (non HMO) | Admitting: Internal Medicine

## 2017-05-13 ENCOUNTER — Other Ambulatory Visit: Payer: Self-pay | Admitting: Internal Medicine

## 2017-07-22 ENCOUNTER — Other Ambulatory Visit: Payer: Self-pay | Admitting: Internal Medicine

## 2017-07-26 ENCOUNTER — Ambulatory Visit: Payer: Managed Care, Other (non HMO) | Admitting: Internal Medicine

## 2017-07-26 ENCOUNTER — Encounter: Payer: Self-pay | Admitting: Internal Medicine

## 2017-07-26 VITALS — BP 126/72 | HR 71 | Temp 97.6°F | Resp 14 | Ht 71.0 in | Wt 201.1 lb

## 2017-07-26 DIAGNOSIS — E039 Hypothyroidism, unspecified: Secondary | ICD-10-CM

## 2017-07-26 DIAGNOSIS — E1165 Type 2 diabetes mellitus with hyperglycemia: Secondary | ICD-10-CM

## 2017-07-26 LAB — BASIC METABOLIC PANEL
BUN: 10 mg/dL (ref 6–23)
CALCIUM: 9.4 mg/dL (ref 8.4–10.5)
CO2: 28 mEq/L (ref 19–32)
CREATININE: 1.09 mg/dL (ref 0.40–1.50)
Chloride: 102 mEq/L (ref 96–112)
GFR: 74.34 mL/min (ref >60.00)
GLUCOSE: 152 mg/dL — AB (ref 70–99)
Potassium: 3.6 mEq/L (ref 3.5–5.1)
Sodium: 141 mEq/L (ref 135–145)

## 2017-07-26 LAB — TSH: TSH: 5.03 u[IU]/mL — ABNORMAL HIGH (ref 0.35–4.50)

## 2017-07-26 MED ORDER — LABETALOL HCL 300 MG PO TABS: 300.0000 mg | ORAL_TABLET | Freq: Two times a day (BID) | ORAL | 0 refills | Status: DC

## 2017-07-26 MED ORDER — INSULIN PEN NEEDLE 32G X 6 MM MISC: 1 refills | Status: DC

## 2017-07-26 MED ORDER — LOSARTAN POTASSIUM-HCTZ 100-25 MG PO TABS: 1.0000 | ORAL_TABLET | Freq: Every day | ORAL | 0 refills | Status: DC

## 2017-07-26 MED ORDER — METFORMIN HCL 850 MG PO TABS: ORAL_TABLET | ORAL | 0 refills | Status: DC

## 2017-07-26 MED ORDER — LIRAGLUTIDE 18 MG/3ML ~~LOC~~ SOPN: 1.2000 mg | PEN_INJECTOR | Freq: Every day | SUBCUTANEOUS | 0 refills | Status: DC

## 2017-07-26 MED ORDER — ROSUVASTATIN CALCIUM 40 MG PO TABS: 40.0000 mg | ORAL_TABLET | Freq: Every day | ORAL | 0 refills | Status: DC

## 2017-07-26 MED ORDER — AMLODIPINE BESYLATE 10 MG PO TABS: 10.0000 mg | ORAL_TABLET | Freq: Every day | ORAL | 0 refills | Status: DC

## 2017-07-26 NOTE — Progress Notes (Signed)
Pre visit review using our clinic review tool, if applicable. No additional management support is needed unless otherwise documented below in the visit note. 

## 2017-07-26 NOTE — Assessment & Plan Note (Signed)
DM: Needs better control, currently on metformin B.I.D.  He did try Victoza, no s/e. Was unable to get x few weeks, cost was a issue; d/w pt option of increase metformin and try actos but he prefers to go for victoza thus will RF meds, re assess in 3 months. Feet exam negative today, due for eye exam, patient plans to call. HTN: Continue amlodipine, Hyzaar, labetalol, BP looks good, check a BMP Hypothyroidism: On Synthroid, check a TSH Declining a flu shot today RTC 3 months

## 2017-07-26 NOTE — Progress Notes (Signed)
Subjective:    Patient ID: Alex Phillips, male    DOB: August 09, 1960, 57 y.o.   MRN: [id number]  DOS:  07/26/2017 Type of visit - description : f/u  Interval history: in general feels well, good compliance with medication except Victoza, was unable to get it from [facility] but would like to try again. Has been traveling a lot, despite that his diet is okay most of the time. No recent ambulatory BPs  Review of Systems Denies lower extremity paresthesias No chest pain, difficulty breathing no lower extremity edema  Past Medical History:  Diagnosis Date  . Bell's palsy   . Diabetes mellitus 11/09   A1c- 6.2  . DJD (degenerative joint disease)    l knee  . EKG, abnormal 12-2007   Stress test (-)   . Elevated PSA 2010   saw urology, PSA was reched and found to be normal. Bx was canceled, did get a u/s d/t michrohematuris- normal  . Elevated WBC count    saw Dr.Enenever; likely benign, monitor  . HTN (hypertension) 11/14/2006  . Hyperlipidemia   . Hypertension   . Hypothyroidism     Past Surgical History:  Procedure Laterality Date  . MOUTH SURGERY     crown extention on right upper   . TONSILLECTOMY AND ADENOIDECTOMY    . TYMPANOSTOMY TUBE PLACEMENT     B as a child  . VASECTOMY  02/2009    Social History   Socioeconomic History  . Marital status: Married    Spouse name: Not on file  . Number of children: 2  . Years of education: Not on file  . Highest education level: Not on file  Social Needs  . Financial resource strain: Not on file  . Food insecurity - worry: Not on file  . Food insecurity - inability: Not on file  . Transportation needs - medical: Not on file  . Transportation needs - non-medical: Not on file  Occupational History  . Occupation: Designer, industrial/productpecial Project manager   Tobacco Use  . Smoking status: Former Smoker    Packs/day: 1.50    Years: 34.00    Pack years: 51.00    Types: Cigarettes    Start date: 08/30/1981    Last attempt to quit:  10/25/2015    Years since quitting: 1.7  . Smokeless tobacco: Never Used  Substance and Sexual Activity  . Alcohol use: No    Alcohol/week: 0.0 oz    Comment: rare  . Drug use: No  . Sexual activity: Not on file  Other Topics Concern  . 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 Questionable, see OV 12-18-16      Medication List        Accurate as of 07/26/17  6:42 PM. Always use your most recent med list.          amLODipine 10 MG tablet Commonly known as:  NORVASC Take 1 tablet (10 mg total) by mouth daily.   aspirin 81 MG tablet Take  81 mg by mouth daily.   Insulin Pen Needle 32G X 6 MM Misc Commonly known as:  NOVOFINE To use w/ Victoza   labetalol 300 MG tablet Commonly known as:  NORMODYNE Take 1 tablet (300 mg total) by mouth 2 (two) times daily.   levothyroxine 175 MCG tablet Commonly known as:  SYNTHROID, LEVOTHROID Take 1 tablet (175 mcg total) by mouth daily before breakfast.   liraglutide 18 MG/3ML Sopn Commonly known as:  VICTOZA Inject 0.2 mLs (1.2 mg total) into the skin daily.   losartan-hydrochlorothiazide 100-25 MG tablet Commonly known as:  HYZAAR Take 1 tablet by mouth daily.   metFORMIN 850 MG tablet Commonly known as:  GLUCOPHAGE Take 2 tablets by mouth daily with breakfast and 1 tablet by mouth daily with dinner.   rosuvastatin 40 MG tablet Commonly known as:  CRESTOR Take 1 tablet (40 mg total) by mouth daily.          Objective:   Physical Exam BP 126/72 (BP Location: Left Arm, Patient Position: Sitting, Cuff Size: Normal)   Pulse 71    Temp 97.6 F (36.4 C) (Oral)   Resp 14   Ht 5\' 11"  (1.803 m)   Wt 201 lb 2 oz (91.2 kg)   SpO2 97%   BMI 28.05 kg/m  General:   Well developed, well nourished . NAD.  HEENT:  Normocephalic . Face symmetric, atraumatic Lungs:  CTA B Normal respiratory effort, no intercostal retractions, no accessory muscle use. Heart: RRR,  no murmur.  No pretibial edema bilaterally  DIABETIC FEET EXAM: No lower extremity edema Normal pedal pulses bilaterally Skin normal, nails normal, no calluses Pinprick examination of the feet normal. Neurologic:  alert & oriented X3.  Speech normal, gait appropriate for age and unassisted Psych--  Cognition and judgment appear intact.  Cooperative with normal attention span and concentration.  Behavior appropriate. No anxious or depressed appearing.      Assessment & Plan:   Assessment  DM HTN Hyperlipidemia Hypothyroidism DJD Bell's palsy Leukocytosis, benign per Hem, monitoring Abnormal EKG 2009 Quit tobacco  ~ 10/2015  PLAN: DM: Needs better control, currently on metformin B.I.D.  He did try Victoza, no s/e. Was unable to get x few weeks, cost was a issue; d/w pt option of increase metformin and try actos but he prefers to go for victoza thus will RF meds, re assess in 3 months. Feet exam negative today, due for eye exam, patient plans to call. HTN: Continue amlodipine, Hyzaar, labetalol, BP looks good, check a BMP Hypothyroidism: On Synthroid, check a TSH Declining a flu shot today RTC 3 months

## 2017-07-26 NOTE — Patient Instructions (Signed)
GO TO THE LAB : Get the blood work     GO TO THE FRONT DESK Schedule your next appointment for a  Check up in 3 months   When you get victoza, start with 0.6 mg a day for the first week

## 2017-07-30 MED ORDER — LEVOTHYROXINE SODIUM 100 MCG PO TABS: 200.0000 ug | ORAL_TABLET | Freq: Every day | ORAL | 0 refills | Status: DC

## 2017-07-30 NOTE — Addendum Note (Signed)
Addended byConrad [employer]: Govind Furey D on: 07/30/2017 04:54 PM   Modules accepted: Orders

## 2017-09-10 ENCOUNTER — Other Ambulatory Visit (INDEPENDENT_AMBULATORY_CARE_PROVIDER_SITE_OTHER): Payer: Managed Care, Other (non HMO)

## 2017-09-10 DIAGNOSIS — E039 Hypothyroidism, unspecified: Secondary | ICD-10-CM | POA: Diagnosis not present

## 2017-09-10 LAB — TSH: TSH: 0.14 u[IU]/mL — ABNORMAL LOW (ref 0.35–4.50)

## 2017-10-04 ENCOUNTER — Other Ambulatory Visit: Payer: Self-pay | Admitting: Internal Medicine

## 2017-10-18 ENCOUNTER — Other Ambulatory Visit: Payer: Self-pay | Admitting: Internal Medicine

## 2017-10-23 ENCOUNTER — Ambulatory Visit: Payer: Managed Care, Other (non HMO) | Admitting: Internal Medicine

## 2017-10-23 ENCOUNTER — Encounter: Payer: Self-pay | Admitting: Internal Medicine

## 2017-10-23 VITALS — BP 124/68 | HR 77 | Temp 97.6°F | Resp 14 | Ht 71.0 in | Wt 198.5 lb

## 2017-10-23 DIAGNOSIS — E1165 Type 2 diabetes mellitus with hyperglycemia: Secondary | ICD-10-CM | POA: Diagnosis not present

## 2017-10-23 DIAGNOSIS — I10 Essential (primary) hypertension: Secondary | ICD-10-CM | POA: Diagnosis not present

## 2017-10-23 DIAGNOSIS — Z720 Tobacco use: Secondary | ICD-10-CM | POA: Diagnosis not present

## 2017-10-23 DIAGNOSIS — E039 Hypothyroidism, unspecified: Secondary | ICD-10-CM | POA: Diagnosis not present

## 2017-10-23 LAB — TSH: TSH: 0.19 u[IU]/mL — ABNORMAL LOW (ref 0.35–4.50)

## 2017-10-23 LAB — HEMOGLOBIN A1C: HEMOGLOBIN A1C: 6.8 % — AB (ref 4.6–6.5)

## 2017-10-23 LAB — ALT: ALT: 17 U/L (ref 0–53)

## 2017-10-23 LAB — AST: AST: 15 U/L (ref 0–37)

## 2017-10-23 NOTE — Progress Notes (Signed)
Subjective:    Patient ID: Alex Phillips, male    DOB: July 07, 1960, 57 y.o.   MRN: [id number]  DOS:  10/23/2017 Type of visit - description : Routine visit Interval history: DM: Good compliance with medication, no ambulatory CBGs. HTN: Good compliance with medicines, no ambulatory BPs Tobacco abuse: He is back smoking for the last 3 months. Hypothyroidism: Due for a TSH  Wt Readings from Last 3 Encounters:  10/23/17 198 lb 8 oz (90 kg)  07/26/17 201 lb 2 oz (91.2 kg)  12/18/16 215 lb 4 oz (97.6 kg)    Review of Systems  Diet has not changed much. Currently with no anxiety or depression Past Medical History:  Diagnosis Date  . Bell's palsy   . Diabetes mellitus 11/09   A1c- 6.2  . DJD (degenerative joint disease)    l knee  . EKG, abnormal 12-2007   Stress test (-)   . Elevated PSA 2010   saw urology, PSA was reched and found to be normal. Bx was canceled, did get a u/s d/t michrohematuris- normal  . Elevated WBC count    saw Dr.Enenever; likely benign, monitor  . HTN (hypertension) 11/14/2006  . Hyperlipidemia   . Hypertension   . Hypothyroidism     Past Surgical History:  Procedure Laterality Date  . MOUTH SURGERY     crown extention on right upper   . TONSILLECTOMY AND ADENOIDECTOMY    . TYMPANOSTOMY TUBE PLACEMENT     B as a child  . VASECTOMY  02/2009    Social History   Socioeconomic History  . Marital status: Married    Spouse name: Not on file  . Number of children: 2  . Years of education: Not on file  . Highest education level: Not on file  Occupational History  . Occupation: Designer, industrial/product   Social Needs  . Financial resource strain: Not on file  . Food insecurity:    Worry: Not on file    Inability: Not on file  . Transportation needs:    Medical: Not on file    Non-medical: Not on file  Tobacco Use  . Smoking status: Current Every Day Smoker    Packs/day: 1.50    Years: 34.00    Pack years: 51.00    Types: Cigarettes   Start date: 08/30/1981    Last attempt to quit: 10/25/2015    Years since quitting: 2.0  . Smokeless tobacco: Never Used  . Tobacco comment: smoking again ~ 07/2017   Substance and Sexual Activity  . Alcohol use: No    Alcohol/week: 0.0 oz    Comment: rare  . Drug use: No  . Sexual activity: Not on file  Lifestyle  . Physical activity:    Days per week: Not on file    Minutes per session: Not on file  . Stress: Not on file  Relationships  . Social connections:    Talks on phone: Not on file    Gets together: Not on file    Attends religious service: Not on file    Active member of club or organization: Not on file    Attends meetings of clubs or organizations: Not on file    Relationship status: Not on file  . Intimate partner violence:    Fear of current or ex partner: Not on file    Emotionally abused: Not on file    Physically abused: Not on file    Forced sexual activity: Not  on file  Other Topics Concern  . 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 Questionable, see OV 12-18-16      Medication List        Accurate as of 10/23/17 11:59 PM. Always use your most recent med list.          amLODipine 10 MG tablet Commonly known as:  NORVASC Take 1 tablet (10 mg total) by mouth daily.   aspirin 81 MG tablet Take 81 mg by mouth daily.   Insulin Pen Needle 32G X 6 MM Misc Commonly known as:  NOVOFINE To use w/ Victoza   labetalol 300 MG tablet Commonly known as:  NORMODYNE Take 1 tablet (300 mg total) by mouth 2 (two) times daily.   levothyroxine 100 MCG tablet Commonly  known as:  SYNTHROID, LEVOTHROID Take 2 tablets (200 mcg total) by mouth daily before breakfast.   liraglutide 18 MG/3ML Sopn Commonly known as:  VICTOZA Inject 0.2 mLs (1.2 mg total) into the skin daily.   losartan-hydrochlorothiazide 100-25 MG tablet Commonly known as:  HYZAAR Take 1 tablet by mouth daily.   metFORMIN 850 MG tablet Commonly known as:  GLUCOPHAGE TAKE 2 TABLETS WITH BREAKFAST AND 1 TABLET WITH DINNER, DAILY   rosuvastatin 40 MG tablet Commonly known as:  CRESTOR Take 1 tablet (40 mg total) by mouth daily.          Objective:   Physical Exam BP 124/68 (BP Location: Left Arm, Patient Position: Sitting, Cuff Size: Normal)   Pulse 77   Temp 97.6 F (36.4 C) (Oral)   Resp 14   Ht  (1.803 m)   Wt 198 lb 8 oz (90 kg)   SpO2 96%   BMI 27.69 kg/m  General:   Well developed, well nourished . NAD.  HEENT:  Normocephalic . Face symmetric, atraumatic Lungs:  CTA B Normal respiratory effort, no intercostal retractions, no accessory muscle use. Heart: RRR,  no murmur.  No pretibial edema bilaterally  Skin: Not pale. Not jaundice Neurologic:  alert & oriented X3.  Speech normal, gait appropriate for age and unassisted Psych--  Cognition and judgment appear intact.  Cooperative with normal attention span and concentration.  Behavior appropriate. No anxious or depressed appearing.      Assessment & Plan:    Assessment  DM HTN Hyperlipidemia Hypothyroidism DJD Bell's palsy Leukocytosis, benign per Hem, monitoring Abnormal EKG 2009 Quit tobacco  ~ 10/2015  PLAN: DM: Continue with Victoza, metformin.  Has lost some weight, related to consistent use of Victoza for the last 3 months?  Has not improve his diet, check A1c, LFTs HTN: Seems controlled on amlodipine, Normodyne, Hyzaar, last BMP satisfactory Hypothyroidism: Last TSH was suppressed, was recommended to change from 200 mcg to 175 mcg but that never happened.  Check a TSH again  today. Tobacco: Back smoking for the last 3 months, previously quit with the gum, did not like Chantix.  Recommend to think about quitting again, Wellbutrin is an option. RTC 3 months  CPX

## 2017-10-23 NOTE — Progress Notes (Signed)
Pre visit review using our clinic review tool, if applicable. No additional management support is needed unless otherwise documented below in the visit note. 

## 2017-10-23 NOTE — Patient Instructions (Signed)
GO TO THE LAB : Get the blood work     GO TO THE FRONT DESK Schedule your next appointment for a physical exam in 3 months, fasting

## 2017-10-24 ENCOUNTER — Other Ambulatory Visit: Payer: Self-pay | Admitting: Internal Medicine

## 2017-10-24 NOTE — Assessment & Plan Note (Signed)
DM: Continue with Victoza, metformin.  Has lost some weight, related to consistent use of Victoza for the last 3 months?  Has not improve his diet, check A1c, LFTs HTN: Seems controlled on amlodipine, Normodyne, Hyzaar, last BMP satisfactory Hypothyroidism: Last TSH was suppressed, was recommended to change from 200 mcg to 175 mcg but that never happened.  Check a TSH again today. Tobacco: Back smoking for the last 3 months, previously quit with the gum, did not like Chantix.  Recommend to think about quitting again, Wellbutrin is an option. RTC 3 months CPX

## 2017-10-26 MED ORDER — LEVOTHYROXINE SODIUM 75 MCG PO TABS: 75.0000 ug | ORAL_TABLET | Freq: Every day | ORAL | 0 refills | Status: DC

## 2017-10-26 MED ORDER — LEVOTHYROXINE SODIUM 100 MCG PO TABS: 100.0000 ug | ORAL_TABLET | Freq: Every day | ORAL | 0 refills | Status: DC

## 2017-10-26 NOTE — Addendum Note (Signed)
Addended byConrad Nambe D on: 10/26/2017 05:10 PM   Modules accepted: Orders

## 2017-10-28 ENCOUNTER — Other Ambulatory Visit: Payer: Self-pay | Admitting: Internal Medicine

## 2017-11-12 ENCOUNTER — Encounter: Payer: Self-pay | Admitting: Internal Medicine

## 2018-01-01 ENCOUNTER — Other Ambulatory Visit: Payer: Self-pay | Admitting: Internal Medicine

## 2018-01-02 ENCOUNTER — Other Ambulatory Visit: Payer: Self-pay | Admitting: Internal Medicine

## 2018-01-03 ENCOUNTER — Other Ambulatory Visit: Payer: Self-pay | Admitting: Internal Medicine

## 2018-01-16 ENCOUNTER — Other Ambulatory Visit: Payer: Self-pay | Admitting: Internal Medicine

## 2018-01-23 ENCOUNTER — Ambulatory Visit (INDEPENDENT_AMBULATORY_CARE_PROVIDER_SITE_OTHER): Payer: Managed Care, Other (non HMO) | Admitting: Internal Medicine

## 2018-01-23 ENCOUNTER — Encounter: Payer: Self-pay | Admitting: Internal Medicine

## 2018-01-23 VITALS — BP 122/70 | HR 72 | Temp 97.7°F | Resp 16 | Ht 71.0 in | Wt 188.5 lb

## 2018-01-23 DIAGNOSIS — Z122 Encounter for screening for malignant neoplasm of respiratory organs: Secondary | ICD-10-CM | POA: Diagnosis not present

## 2018-01-23 DIAGNOSIS — E1165 Type 2 diabetes mellitus with hyperglycemia: Secondary | ICD-10-CM | POA: Diagnosis not present

## 2018-01-23 DIAGNOSIS — Z0000 Encounter for general adult medical examination without abnormal findings: Secondary | ICD-10-CM | POA: Diagnosis not present

## 2018-01-23 DIAGNOSIS — F17200 Nicotine dependence, unspecified, uncomplicated: Secondary | ICD-10-CM

## 2018-01-23 DIAGNOSIS — E039 Hypothyroidism, unspecified: Secondary | ICD-10-CM

## 2018-01-23 LAB — LIPID PANEL
CHOL/HDL RATIO: 3
CHOLESTEROL: 104 mg/dL (ref 0–200)
HDL: 33.7 mg/dL — ABNORMAL LOW (ref >39.00)
LDL CALC: 45 mg/dL (ref 0–99)
NONHDL: 70.58
Triglycerides: 129 mg/dL (ref 0.0–149.0)
VLDL: 25.8 mg/dL (ref 0.0–40.0)

## 2018-01-23 LAB — COMPREHENSIVE METABOLIC PANEL
ALBUMIN: 4.4 g/dL (ref 3.5–5.2)
ALT: 17 U/L (ref 0–53)
AST: 13 U/L (ref 0–37)
Alkaline Phosphatase: 53 U/L (ref 39–117)
BUN: 11 mg/dL (ref 6–23)
CHLORIDE: 103 meq/L (ref 96–112)
CO2: 34 meq/L — AB (ref 19–32)
CREATININE: 1.17 mg/dL (ref 0.40–1.50)
Calcium: 10.1 mg/dL (ref 8.4–10.5)
GFR: 68.38 mL/min (ref >60.00)
GLUCOSE: 103 mg/dL — AB (ref 70–99)
POTASSIUM: 4.2 meq/L (ref 3.5–5.1)
SODIUM: 145 meq/L (ref 135–145)
Total Bilirubin: 0.4 mg/dL (ref 0.2–1.2)
Total Protein: 7.2 g/dL (ref 6.0–8.3)

## 2018-01-23 LAB — TSH: TSH: 0.04 u[IU]/mL — AB (ref 0.35–4.50)

## 2018-01-23 LAB — CBC WITH DIFFERENTIAL/PLATELET
BASOS ABS: 0.1 10*3/uL (ref 0.0–0.1)
Basophils Relative: 1.1 % (ref 0.0–3.0)
EOS ABS: 0.4 10*3/uL (ref 0.0–0.7)
Eosinophils Relative: 3.5 % (ref 0.0–5.0)
HCT: 46.7 % (ref 39.0–52.0)
Hemoglobin: 16.1 g/dL (ref 13.0–17.0)
LYMPHS ABS: 2.9 10*3/uL (ref 0.7–4.0)
LYMPHS PCT: 23.3 % (ref 12.0–46.0)
MCHC: 34.5 g/dL (ref 30.0–36.0)
MCV: 95.8 fl (ref 78.0–100.0)
MONO ABS: 0.7 10*3/uL (ref 0.1–1.0)
Monocytes Relative: 5.9 % (ref 3.0–12.0)
NEUTROS ABS: 8.2 10*3/uL — AB (ref 1.4–7.7)
NEUTROS PCT: 66.2 % (ref 43.0–77.0)
PLATELETS: 190 10*3/uL (ref 150.0–400.0)
RBC: 4.88 Mil/uL (ref 4.22–5.81)
RDW: 13.3 % (ref 11.5–15.5)
WBC: 12.3 10*3/uL — AB (ref 4.0–10.5)

## 2018-01-23 LAB — PSA: PSA: 1.47 ng/mL (ref 0.10–4.00)

## 2018-01-23 LAB — HEMOGLOBIN A1C: HEMOGLOBIN A1C: 6.5 % (ref 4.6–6.5)

## 2018-01-23 NOTE — Assessment & Plan Note (Addendum)
--  Tdap 2015;  prevnar- 2016;  Pneumonia shot 2014;  shingrex discussed  -- CCS 12-2003: Cscope , TICs, hyperplastic polyps  08-2013 Cscope no polyps, 10 years --H/o elevated PSA, had a  urology evaluation 2011; he is asx, DRE normal today, check a PSA --Diet: Victoza has controlled his appetite, has lost weight.  Praised.  Recommend physical activity. --Labs: CMP FLP, CBC, A1c, TSH, PSA --Heavy smoker, he agreed to proceed with lung cancer screening CT

## 2018-01-23 NOTE — Assessment & Plan Note (Signed)
DM: + Weight loss, appetite decreased with Victoza.  Continue Victoza, metformin, check a A1c.  Recommend eye exam HTN: Controlled on amlodipine, labetalol, Hyzaar. High cholesterol: On Crestor, checking labs Hypothyroidism: Last TSH 0.19, Synthroid decreased to 175 mcg daily.  Checking labs Tobacco abuse:  - Rec   CT lung for screening, -Sees the dentist regularly - thinking about quitting, previously did not like Chantix, Wellbutrin is an option. RTC 4 to 5 months

## 2018-01-23 NOTE — Patient Instructions (Addendum)
GO TO THE LAB : Get the blood work     GO TO THE FRONT DESK Schedule your next appointment for a checkup in 4 to 5 months  Please see your eye doctor

## 2018-01-23 NOTE — Progress Notes (Signed)
Pre visit review using our clinic review tool, if applicable. No additional management support is needed unless otherwise documented below in the visit note. 

## 2018-01-23 NOTE — Progress Notes (Signed)
Subjective:    Patient ID: Alex Phillips, male    DOB: 1960-10-27, 57 y.o.   MRN: [id number]  DOS:  01/23/2018 Type of visit - description : CPX Interval history: No major concerns Wt Readings from Last 3 Encounters:  01/23/18 188 lb 8 oz (85.5 kg)  10/23/17 198 lb 8 oz (90 kg)  07/26/17 201 lb 2 oz (91.2 kg)    Review of Systems I noted weight loss, his appetite has decreased with Victoza, he feels well, denies fever, chills.  No abdominal pain.  No nausea, vomiting, diarrhea. He denies a sputum production or hemoptysis  Other than above, a 14 point review of systems is negative      Past Medical History:  Diagnosis Date  . Bell's palsy   . Diabetes mellitus 11/09   A1c- 6.2  . DJD (degenerative joint disease)    l knee  . EKG, abnormal 12-2007   Stress test (-)   . Elevated PSA 2010   saw urology, PSA was reched and found to be normal. Bx was canceled, did get a u/s d/t michrohematuris- normal  . Elevated WBC count    saw Dr.Enenever; likely benign, monitor  . HTN (hypertension) 11/14/2006  . Hyperlipidemia   . Hypertension   . Hypothyroidism     Past Surgical History:  Procedure Laterality Date  . MOUTH SURGERY     crown extention on right upper   . TONSILLECTOMY AND ADENOIDECTOMY    . TYMPANOSTOMY TUBE PLACEMENT     B as a child  . VASECTOMY  02/2009    Social History   Socioeconomic History  . Marital status: Married    Spouse name: Not on file  . Number of children: 0  . Years of education: Not on file  . Highest education level: Not on file  Occupational History  . Occupation: Designer, industrial/product   Social Needs  . Financial resource strain: Not on file  . Food insecurity:    Worry: Not on file    Inability: Not on file  . Transportation needs:    Medical: Not on file    Non-medical: Not on file  Tobacco Use  . Smoking status: Current Every Day Smoker    Packs/day: 1.50    Years: 34.00    Pack years: 51.00    Types: Cigarettes   Start date: 08/30/1981    Last attempt to quit: 10/25/2015    Years since quitting: 2.2  . Smokeless tobacco: Never Used  . Tobacco comment: smoking again ~ 07/2017   Substance and Sexual Activity  . Alcohol use: No    Alcohol/week: 0.0 oz    Comment: rare  . Drug use: No  . Sexual activity: Not on file  Lifestyle  . Physical activity:    Days per week: Not on file    Minutes per session: Not on file  . Stress: Not on file  Relationships  . Social connections:    Talks on phone: Not on file    Gets together: Not on file    Attends religious service: Not on file    Active member of club or organization: Not on file    Attends meetings of clubs or organizations: Not on file    Relationship status: Not on file  . Intimate partner violence:    Fear of current or ex partner: Not on file    Emotionally abused: Not on file    Physically abused: Not on file  Forced sexual activity: Not on file  Other Topics Concern  . 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 Dementia Mother   . Colon cancer Neg Hx   . Stomach cancer Neg Hx      Allergies as of 01/23/2018      Reactions   Amoxicillin-pot Clavulanate    REACTION: rash/swelling/nausea   Cefaclor    Chantix [varenicline] Other (See Comments)   Aggression per Pt's wife   Januvia [sitagliptin]    Rash? Questionable, see OV 12-18-16      Medication List        Accurate as of 01/23/18  9:50 PM. Always use your most recent med list.          amLODipine 10 MG tablet Commonly known as:  NORVASC Take 1 tablet (10 mg total) by mouth daily.   aspirin 81 MG tablet Take 81 mg by mouth daily.   Insulin Pen Needle 32G X 6 MM Misc Commonly known as:  NOVOFINE To use  w/ Victoza   labetalol 300 MG tablet Commonly known as:  NORMODYNE Take 1 tablet (300 mg total) by mouth 2 (two) times daily.   levothyroxine 100 MCG tablet Commonly known as:  SYNTHROID, LEVOTHROID Take 1 tablet (100 mcg total) by mouth daily before breakfast.   levothyroxine 75 MCG tablet Commonly known as:  SYNTHROID, LEVOTHROID Take 1 tablet (75 mcg total) by mouth daily before breakfast.   liraglutide 18 MG/3ML Sopn Commonly known as:  VICTOZA Inject 0.2 mLs (1.2 mg total) into the skin daily.   losartan-hydrochlorothiazide 100-25 MG tablet Commonly known as:  HYZAAR Take 1 tablet by mouth daily.   metFORMIN 850 MG tablet Commonly known as:  GLUCOPHAGE TAKE 2 TABLETS WITH BREAKFAST AND 1 TABLET WITH DINNER, DAILY   rosuvastatin 40 MG tablet Commonly known as:  CRESTOR Take 1 tablet (40 mg total) by mouth daily.          Objective:   Physical Exam BP 122/70 (BP Location: Left Arm, Patient Position: Sitting, Cuff Size: Small)   Pulse 72   Temp 97.7 F (36.5 C) (Oral)   Resp 16   Ht 5\' 11"  (1.803 m)   Wt 188 lb 8 oz (85.5 kg)   SpO2 96%   BMI 26.29 kg/m  General: Well developed, NAD, see BMI.  Neck: No  thyromegaly  HEENT:  Normocephalic . Face symmetric, atraumatic Lungs:  CTA B Normal respiratory effort, no intercostal retractions, no accessory muscle use. Heart: RRR,  no murmur.  No pretibial edema bilaterally  Abdomen:  Not distended, soft, non-tender. No rebound or rigidity.   Skin: Exposed areas without rash. Not pale. Not jaundice Rectal: External abnormalities: none. Normal sphincter tone. No rectal masses or tenderness.  Brown stools Prostate: Prostate gland firm and smooth, no enlargement, nodularity, tenderness, mass, asymmetry or induration Neurologic:  alert & oriented X3.  Speech normal, gait appropriate for age and unassisted Strength symmetric and appropriate for age.  Psych: Cognition and judgment appear intact.  Cooperative  with normal attention span and concentration.  Behavior appropriate. No anxious or depressed appearing.     Assessment & Plan:   Assessment  DM  HTN Hyperlipidemia Hypothyroidism DJD Bell's palsy Leukocytosis, benign per Hem, monitoring Abnormal EKG 2009 Quit tobacco  ~ 10/2015  PLAN: DM: + Weight loss, appetite decreased with Victoza.  Continue Victoza, metformin, check a A1c.  Recommend eye exam HTN: Controlled on amlodipine, labetalol, Hyzaar. High cholesterol: On Crestor, checking labs Hypothyroidism: Last TSH 0.19, Synthroid decreased to 175 mcg daily.  Checking labs Tobacco abuse:  - Rec   CT lung for screening, -Sees the dentist regularly - thinking about quitting, previously did not like Chantix, Wellbutrin is an option. RTC 4 to 5 months

## 2018-01-24 ENCOUNTER — Ambulatory Visit (HOSPITAL_BASED_OUTPATIENT_CLINIC_OR_DEPARTMENT_OTHER)
Admission: RE | Admit: 2018-01-24 | Discharge: 2018-01-24 | Disposition: A | Discharge status: Home / Self Care | Payer: Managed Care, Other (non HMO) | Source: Ambulatory Visit | Attending: Internal Medicine | Admitting: Internal Medicine

## 2018-01-24 DIAGNOSIS — I2510 Atherosclerotic heart disease of native coronary artery without angina pectoris: Secondary | ICD-10-CM | POA: Diagnosis not present

## 2018-01-24 DIAGNOSIS — F17200 Nicotine dependence, unspecified, uncomplicated: Secondary | ICD-10-CM | POA: Insufficient documentation

## 2018-01-24 DIAGNOSIS — Z122 Encounter for screening for malignant neoplasm of respiratory organs: Secondary | ICD-10-CM | POA: Insufficient documentation

## 2018-01-24 DIAGNOSIS — I700 Atherosclerosis of aorta: Secondary | ICD-10-CM | POA: Diagnosis not present

## 2018-01-25 ENCOUNTER — Other Ambulatory Visit: Payer: Self-pay | Admitting: Internal Medicine

## 2018-01-25 MED ORDER — LEVOTHYROXINE SODIUM 150 MCG PO TABS: 150.0000 ug | ORAL_TABLET | Freq: Every day | ORAL | 1 refills | Status: DC

## 2018-01-25 NOTE — Addendum Note (Signed)
Addended byConrad [employer]: Darryll Raju D on: 01/25/2018 04:18 PM   Modules accepted: Orders

## 2018-02-18 ENCOUNTER — Other Ambulatory Visit: Payer: Self-pay | Admitting: Internal Medicine

## 2018-02-18 DIAGNOSIS — Z794 Long term (current) use of insulin: Principal | ICD-10-CM

## 2018-02-18 DIAGNOSIS — E1165 Type 2 diabetes mellitus with hyperglycemia: Secondary | ICD-10-CM

## 2018-04-02 ENCOUNTER — Other Ambulatory Visit: Payer: Self-pay | Admitting: Internal Medicine

## 2018-04-03 ENCOUNTER — Other Ambulatory Visit: Payer: Self-pay | Admitting: Internal Medicine

## 2018-04-05 LAB — HM DIABETES EYE EXAM

## 2018-04-15 ENCOUNTER — Other Ambulatory Visit (INDEPENDENT_AMBULATORY_CARE_PROVIDER_SITE_OTHER): Payer: Managed Care, Other (non HMO)

## 2018-04-15 DIAGNOSIS — E039 Hypothyroidism, unspecified: Secondary | ICD-10-CM | POA: Diagnosis not present

## 2018-04-15 LAB — TSH: TSH: 0.17 u[IU]/mL — ABNORMAL LOW (ref 0.35–4.50)

## 2018-04-16 ENCOUNTER — Other Ambulatory Visit: Payer: Self-pay | Admitting: Internal Medicine

## 2018-04-18 MED ORDER — LEVOTHYROXINE SODIUM 125 MCG PO TABS: 125.0000 ug | ORAL_TABLET | Freq: Every day | ORAL | 3 refills | Status: DC

## 2018-04-18 NOTE — Addendum Note (Signed)
Addended by: Conrad [place] D on: 04/18/2018 11:40 AM   Modules accepted: Orders

## 2018-04-22 ENCOUNTER — Other Ambulatory Visit: Payer: Self-pay | Admitting: Internal Medicine

## 2018-04-26 ENCOUNTER — Encounter: Payer: Self-pay | Admitting: Internal Medicine

## 2018-06-25 ENCOUNTER — Ambulatory Visit: Payer: Managed Care, Other (non HMO) | Admitting: Internal Medicine

## 2018-06-25 ENCOUNTER — Encounter: Payer: Self-pay | Admitting: Internal Medicine

## 2018-06-25 VITALS — BP 118/64 | HR 81 | Temp 98.0°F | Resp 16 | Ht 71.0 in | Wt 192.4 lb

## 2018-06-25 DIAGNOSIS — E1165 Type 2 diabetes mellitus with hyperglycemia: Secondary | ICD-10-CM

## 2018-06-25 DIAGNOSIS — I10 Essential (primary) hypertension: Secondary | ICD-10-CM

## 2018-06-25 DIAGNOSIS — Z794 Long term (current) use of insulin: Secondary | ICD-10-CM | POA: Diagnosis not present

## 2018-06-25 DIAGNOSIS — E039 Hypothyroidism, unspecified: Secondary | ICD-10-CM | POA: Diagnosis not present

## 2018-06-25 LAB — BASIC METABOLIC PANEL
BUN: 10 mg/dL (ref 6–23)
CALCIUM: 8.9 mg/dL (ref 8.4–10.5)
CO2: 28 meq/L (ref 19–32)
Chloride: 105 mEq/L (ref 96–112)
Creatinine, Ser: 1.08 mg/dL (ref 0.40–1.50)
GFR: 74.89 mL/min (ref >60.00)
GLUCOSE: 114 mg/dL — AB (ref 70–99)
Potassium: 3.5 mEq/L (ref 3.5–5.1)
Sodium: 141 mEq/L (ref 135–145)

## 2018-06-25 LAB — TSH: TSH: 3.99 u[IU]/mL (ref 0.35–4.50)

## 2018-06-25 LAB — HEMOGLOBIN A1C: HEMOGLOBIN A1C: 6.4 % (ref 4.6–6.5)

## 2018-06-25 NOTE — Progress Notes (Signed)
Pre visit review using our clinic review tool, if applicable. No additional management support is needed unless otherwise documented below in the visit note. 

## 2018-06-25 NOTE — Progress Notes (Signed)
Subjective:    Patient ID: Alex Phillips, male    DOB: [birth year], 57 y.o.   MRN: [id number]  DOS:  06/25/2018 Type of visit - description: rov  Since the last office visit he is doing well. No ambulatory CBGs No ambulatory BPs  Wt Readings from Last 3 Encounters:  06/25/18 192 lb 6 oz (87.3 kg)  01/23/18 188 lb 8 oz (85.5 kg)  10/23/17 198 lb 8 oz (90 kg)     Review of Systems Stress level has decreased significantly, he retired from work. Still smoking Had a cold last week, much improved, minimal cough at this point.  Past Medical History:  Diagnosis Date  . Bell's palsy   . Diabetes mellitus 11/09   A1c- 6.2  . DJD (degenerative joint disease)    l knee  . EKG, abnormal 12-2007   Stress test (-)   . Elevated PSA 2010   saw urology, PSA was reched and found to be normal. Bx was canceled, did get a u/s d/t michrohematuris- normal  . Elevated WBC count    saw Dr.Enenever; likely benign, monitor  . HTN (hypertension) 11/14/2006  . Hyperlipidemia   . Hypertension   . Hypothyroidism     Past Surgical History:  Procedure Laterality Date  . MOUTH SURGERY     crown extention on right upper   . TONSILLECTOMY AND ADENOIDECTOMY    . TYMPANOSTOMY TUBE PLACEMENT     B as a child  . VASECTOMY  02/2009    Social History   Socioeconomic History  . Marital status: Married    Spouse name: Not on file  . Number of children: 0  . Years of education: Not on file  . Highest education level: Not on file  Occupational History  . Occupation: Designer, industrial/productpecial Project manager   Social Needs  . Financial resource strain: Not on file  . Food insecurity:    Worry: Not on file    Inability: Not on file  . Transportation needs:    Medical: Not on file    Non-medical: Not on file  Tobacco Use  . Smoking status: Current Every Day Smoker    Packs/day: 1.50    Years: 34.00    Pack years: 51.00    Types: Cigarettes    Start date: 08/30/1981    Last attempt to quit: 10/25/2015    Years  since quitting: 2.6  . Smokeless tobacco: Never Used  . Tobacco comment: smoking again ~ 07/2017   Substance and Sexual Activity  . Alcohol use: No    Alcohol/week: 0.0 standard drinks    Comment: rare  . Drug use: No  . Sexual activity: Not on file  Lifestyle  . Physical activity:    Days per week: Not on file    Minutes per session: Not on file  . Stress: Not on file  Relationships  . Social connections:    Talks on phone: Not on file    Gets together: Not on file    Attends religious service: Not on file    Active member of club or organization: Not on file    Attends meetings of clubs or organizations: Not on file    Relationship status: Not on file  . Intimate partner violence:    Fear of current or ex partner: Not on file    Emotionally abused: Not on file    Physically abused: Not on file    Forced sexual activity: Not on file  Other  Topics Concern  . 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 Questionable, see OV 12-18-16      Medication List       Accurate as of June 25, 2018  9:26 AM. Always use your most recent med list.        amLODipine 10 MG tablet Commonly known as:  NORVASC Take 1 tablet (10 mg total) by mouth daily.   aspirin 81 MG tablet Take 81 mg by mouth daily.   BD PEN NEEDLE MICRO U/F 32G X 6 MM Misc Generic drug:  Insulin Pen Needle USE WITH VICTOZA   labetalol 300 MG tablet Commonly known as:  NORMODYNE Take 1 tablet (300 mg total) by mouth 2 (two) times daily.   levothyroxine 125 MCG tablet Commonly known  as:  SYNTHROID, LEVOTHROID Take 1 tablet (125 mcg total) by mouth daily before breakfast.   liraglutide 18 MG/3ML Sopn Commonly known as:  VICTOZA Inject 0.2 mLs (1.2 mg total) into the skin daily.   losartan-hydrochlorothiazide 100-25 MG tablet Commonly known as:  HYZAAR Take 1 tablet by mouth daily.   metFORMIN 850 MG tablet Commonly known as:  GLUCOPHAGE TAKE 2 TABLETS WITH BREAKFAST AND 1 TABLET WITH DINNER, DAILY   rosuvastatin 40 MG tablet Commonly known as:  CRESTOR Take 1 tablet (40 mg total) by mouth daily.           Objective:   Physical Exam BP 118/64 (BP Location: Left Arm, Patient Position: Sitting, Cuff Size: Normal)   Pulse 81   Temp 98 F (36.7 C) (Oral)   Resp 16   Ht 5\' 11"  (1.803 m)   Wt 192 lb 6 oz (87.3 kg)   SpO2 94%   BMI 26.83 kg/m  General:   Well developed, NAD, BMI noted. HEENT:  Normocephalic . Face symmetric, atraumatic Lungs:  CTA B Normal respiratory effort, no intercostal retractions, no accessory muscle use. Heart: RRR,  no murmur.  No pretibial edema bilaterally  Skin: Not pale. Not jaundice Neurologic:  alert & oriented X3.  Speech normal, gait appropriate for age and unassisted Psych--  Cognition and judgment appear intact.  Cooperative with normal attention span and concentration.  Behavior appropriate. No anxious or depressed appearing.      Assessment     Assessment  DM HTN Hyperlipidemia Hypothyroidism DJD Bell's palsy Leukocytosis, benign per Hem, monitoring Abnormal EKG 2009 Tobacco  Abuse   PLAN: DM: Continue Victoza, metformin.  Does not have a glucometer, he is at low risk for hypoglycemia, will consider a glucometer in the future.  Advised on how to recognize low CBGs symptoms.  Recommend to carry a snack with him. HTN: No ambulatory BPs but seems to be well controlled.  Continue amlodipine, labetalol, Hyzaar.  Check a BMP Hypothyroidism: Since the last visit, we has been adjusting his thyroid  medication.  Currently on Synthroid 0.125.  Check a TSH Strongly declined a flu shot. RTC 12/2018 CPX

## 2018-06-25 NOTE — Patient Instructions (Signed)
GO TO THE LAB : Get the blood work     GO TO THE FRONT DESK Schedule your next appointment for a  Physical exam by 12/2018

## 2018-06-27 NOTE — Assessment & Plan Note (Signed)
DM: Continue Victoza, metformin.  Does not have a glucometer, he is at low risk for hypoglycemia, will consider a glucometer in the future.  Advised on how to recognize low CBGs symptoms.  Recommend to carry a snack with him. HTN: No ambulatory BPs but seems to be well controlled.  Continue amlodipine, labetalol, Hyzaar.  Check a BMP Hypothyroidism: Since the last visit, we has been adjusting his thyroid medication.  Currently on Synthroid 0.125.  Check a TSH Strongly declined a flu shot. RTC 12/2018 CPX

## 2018-07-01 ENCOUNTER — Other Ambulatory Visit: Payer: Self-pay | Admitting: Internal Medicine

## 2018-07-08 ENCOUNTER — Other Ambulatory Visit: Payer: Self-pay | Admitting: Internal Medicine

## 2018-07-15 DIAGNOSIS — M9902 Segmental and somatic dysfunction of thoracic region: Secondary | ICD-10-CM | POA: Diagnosis not present

## 2018-07-15 DIAGNOSIS — M9901 Segmental and somatic dysfunction of cervical region: Secondary | ICD-10-CM | POA: Diagnosis not present

## 2018-07-15 DIAGNOSIS — M9903 Segmental and somatic dysfunction of lumbar region: Secondary | ICD-10-CM | POA: Diagnosis not present

## 2018-07-15 DIAGNOSIS — M791 Myalgia, unspecified site: Secondary | ICD-10-CM | POA: Diagnosis not present

## 2018-07-22 ENCOUNTER — Encounter: Payer: Self-pay | Admitting: Internal Medicine

## 2018-07-22 MED ORDER — LEVOTHYROXINE SODIUM 125 MCG PO TABS: 125.0000 ug | ORAL_TABLET | Freq: Every day | ORAL | 1 refills | Status: DC

## 2018-08-26 DIAGNOSIS — M9901 Segmental and somatic dysfunction of cervical region: Secondary | ICD-10-CM | POA: Diagnosis not present

## 2018-08-26 DIAGNOSIS — M9903 Segmental and somatic dysfunction of lumbar region: Secondary | ICD-10-CM | POA: Diagnosis not present

## 2018-08-26 DIAGNOSIS — M791 Myalgia, unspecified site: Secondary | ICD-10-CM | POA: Diagnosis not present

## 2018-08-26 DIAGNOSIS — M9902 Segmental and somatic dysfunction of thoracic region: Secondary | ICD-10-CM | POA: Diagnosis not present

## 2018-09-29 ENCOUNTER — Other Ambulatory Visit: Payer: Self-pay | Admitting: Internal Medicine

## 2018-10-13 ENCOUNTER — Other Ambulatory Visit: Payer: Self-pay | Admitting: Internal Medicine

## 2018-10-19 ENCOUNTER — Other Ambulatory Visit: Payer: Self-pay | Admitting: Internal Medicine

## 2018-11-22 ENCOUNTER — Telehealth: Payer: Self-pay

## 2018-11-22 DIAGNOSIS — E1165 Type 2 diabetes mellitus with hyperglycemia: Secondary | ICD-10-CM

## 2018-11-22 NOTE — Telephone Encounter (Signed)
PA initiated via Covermymeds; KEY: [id number]. Awaiting determination.

## 2018-11-29 NOTE — Telephone Encounter (Signed)
PA denied. Preferred alternatives: Bydureon vial, Bydureon BCise, Bydureon pen, Byetta, Ozempic, and Trulicity.

## 2018-11-29 NOTE — Telephone Encounter (Signed)
Form received from [facility] for more information. Form completed and faxed to [phone]. Awaiting determination.

## 2018-12-02 MED ORDER — INSULIN PEN NEEDLE 32G X 6 MM MISC: 1 refills | Status: DC

## 2018-12-02 MED ORDER — SEMAGLUTIDE(0.25 OR 0.5MG/DOS) 2 MG/1.5ML ~~LOC~~ SOPN: 0.5000 mg | PEN_INJECTOR | SUBCUTANEOUS | 3 refills | Status: DC

## 2018-12-02 NOTE — Telephone Encounter (Signed)
Please call the patient, tell him about his insurance decision, okay to change to Ozempic 0.5 mg weekly Please send a prescription

## 2018-12-02 NOTE — Telephone Encounter (Signed)
Mychart message sent to Pt regarding change to Ozempic. Rx sent to pharmacy w/ pen needles. Instructed Pt to let us know if he has questions/concerns.

## 2018-12-04 DIAGNOSIS — M9902 Segmental and somatic dysfunction of thoracic region: Secondary | ICD-10-CM | POA: Diagnosis not present

## 2018-12-04 DIAGNOSIS — M5386 Other specified dorsopathies, lumbar region: Secondary | ICD-10-CM | POA: Diagnosis not present

## 2018-12-04 DIAGNOSIS — M9905 Segmental and somatic dysfunction of pelvic region: Secondary | ICD-10-CM | POA: Diagnosis not present

## 2018-12-04 DIAGNOSIS — M9903 Segmental and somatic dysfunction of lumbar region: Secondary | ICD-10-CM | POA: Diagnosis not present

## 2019-01-01 ENCOUNTER — Ambulatory Visit (INDEPENDENT_AMBULATORY_CARE_PROVIDER_SITE_OTHER): Payer: BC Managed Care – PPO | Admitting: Internal Medicine

## 2019-01-01 ENCOUNTER — Encounter: Payer: Self-pay | Admitting: Internal Medicine

## 2019-01-01 ENCOUNTER — Other Ambulatory Visit: Payer: Self-pay

## 2019-01-01 ENCOUNTER — Other Ambulatory Visit: Payer: Self-pay | Admitting: Internal Medicine

## 2019-01-01 VITALS — BP 141/96 | HR 76 | Temp 98.0°F | Resp 16 | Ht 71.0 in | Wt 194.4 lb

## 2019-01-01 DIAGNOSIS — E1169 Type 2 diabetes mellitus with other specified complication: Secondary | ICD-10-CM

## 2019-01-01 DIAGNOSIS — I2510 Atherosclerotic heart disease of native coronary artery without angina pectoris: Secondary | ICD-10-CM

## 2019-01-01 DIAGNOSIS — E1165 Type 2 diabetes mellitus with hyperglycemia: Secondary | ICD-10-CM

## 2019-01-01 DIAGNOSIS — Z794 Long term (current) use of insulin: Secondary | ICD-10-CM

## 2019-01-01 DIAGNOSIS — Z0000 Encounter for general adult medical examination without abnormal findings: Secondary | ICD-10-CM

## 2019-01-01 DIAGNOSIS — I2584 Coronary atherosclerosis due to calcified coronary lesion: Secondary | ICD-10-CM

## 2019-01-01 DIAGNOSIS — E039 Hypothyroidism, unspecified: Secondary | ICD-10-CM | POA: Diagnosis not present

## 2019-01-01 DIAGNOSIS — I10 Essential (primary) hypertension: Secondary | ICD-10-CM | POA: Diagnosis not present

## 2019-01-01 DIAGNOSIS — Z122 Encounter for screening for malignant neoplasm of respiratory organs: Secondary | ICD-10-CM

## 2019-01-01 DIAGNOSIS — E785 Hyperlipidemia, unspecified: Secondary | ICD-10-CM

## 2019-01-01 LAB — COMPREHENSIVE METABOLIC PANEL
ALT: 21 U/L (ref 0–53)
AST: 18 U/L (ref 0–37)
Albumin: 4.4 g/dL (ref 3.5–5.2)
Alkaline Phosphatase: 54 U/L (ref 39–117)
BUN: 13 mg/dL (ref 6–23)
CO2: 29 mEq/L (ref 19–32)
Calcium: 9.5 mg/dL (ref 8.4–10.5)
Chloride: 97 mEq/L (ref 96–112)
Creatinine, Ser: 1.2 mg/dL (ref 0.40–1.50)
GFR: 62.28 mL/min (ref >60.00)
Glucose, Bld: 84 mg/dL (ref 70–99)
Potassium: 3.7 mEq/L (ref 3.5–5.1)
Sodium: 136 mEq/L (ref 135–145)
Total Bilirubin: 0.3 mg/dL (ref 0.2–1.2)
Total Protein: 6.9 g/dL (ref 6.0–8.3)

## 2019-01-01 LAB — CBC WITH DIFFERENTIAL/PLATELET
Basophils Absolute: 0.2 10*3/uL — ABNORMAL HIGH (ref 0.0–0.1)
Basophils Relative: 1.5 % (ref 0.0–3.0)
Eosinophils Absolute: 0.4 10*3/uL (ref 0.0–0.7)
Eosinophils Relative: 4.1 % (ref 0.0–5.0)
HCT: 45.2 % (ref 39.0–52.0)
Hemoglobin: 15.8 g/dL (ref 13.0–17.0)
Lymphocytes Relative: 27.9 % (ref 12.0–46.0)
Lymphs Abs: 3 10*3/uL (ref 0.7–4.0)
MCHC: 35 g/dL (ref 30.0–36.0)
MCV: 97 fl (ref 78.0–100.0)
Monocytes Absolute: 0.8 10*3/uL (ref 0.1–1.0)
Monocytes Relative: 7.2 % (ref 3.0–12.0)
Neutro Abs: 6.3 10*3/uL (ref 1.4–7.7)
Neutrophils Relative %: 59.3 % (ref 43.0–77.0)
Platelets: 198 10*3/uL (ref 150.0–400.0)
RBC: 4.66 Mil/uL (ref 4.22–5.81)
RDW: 13.2 % (ref 11.5–15.5)
WBC: 10.6 10*3/uL — ABNORMAL HIGH (ref 4.0–10.5)

## 2019-01-01 LAB — LIPID PANEL
Cholesterol: 97 mg/dL (ref 0–200)
HDL: 30.2 mg/dL — ABNORMAL LOW (ref >39.00)
LDL Cholesterol: 35 mg/dL (ref 0–99)
NonHDL: 66.34
Total CHOL/HDL Ratio: 3
Triglycerides: 155 mg/dL — ABNORMAL HIGH (ref 0.0–149.0)
VLDL: 31 mg/dL (ref 0.0–40.0)

## 2019-01-01 LAB — TSH: TSH: 4.31 u[IU]/mL (ref 0.35–4.50)

## 2019-01-01 LAB — HEMOGLOBIN A1C: Hgb A1c MFr Bld: 6.4 % (ref 4.6–6.5)

## 2019-01-01 NOTE — Progress Notes (Signed)
Subjective:    Patient ID: Alex Phillips, male    DOB: 04/10/1961, 58 y.o.   MRN: [id number]  DOS:  01/01/2019 Type of visit - description: Here for CPX In general doing well. Has usual aches and pains.  Lately the right shoulder has been slightly more painful.  Otherwise denies any problems.   Review of Systems  A 14 point review of systems is negative   Past Medical History:  Diagnosis Date  . Bell's palsy   . Diabetes mellitus 11/09   A1c- 6.2  . DJD (degenerative joint disease)    l knee  . EKG, abnormal 12-2007   Stress test (-)   . Elevated PSA 2010   saw urology, PSA was reched and found to be normal. Bx was canceled, did get a u/s d/t michrohematuris- normal  . Elevated WBC count    saw Dr.Enenever; likely benign, monitor  . HTN (hypertension) 11/14/2006  . Hyperlipidemia   . Hypertension   . Hypothyroidism     Past Surgical History:  Procedure Laterality Date  . MOUTH SURGERY     crown extention on right upper   . TONSILLECTOMY AND ADENOIDECTOMY    . TYMPANOSTOMY TUBE PLACEMENT     B as a child  . VASECTOMY  02/2009    Social History   Socioeconomic History  . Marital status: Married    Spouse name: Not on file  . Number of children: 0  . Years of education: Not on file  . Highest education level: Not on file  Occupational History  . Occupation: Designer, industrial/productpecial Project manager   . 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 Financial resource strain: Not on file  . Food insecurity    Worry: Not on file    Inability: Not on file  . Transportation needs    Medical: Not on file    Non-medical: Not on file  Tobacco Use  . Smoking status: Current Every Day Smoker    Packs/day: 1.50    Years: 34.00    Pack years: 51.00    Types: Cigarettes    Start date: 08/30/1981    Last attempt to quit: 10/25/2015    Years since quitting: 3.1  . Smokeless tobacco: Never Used  . Tobacco comment: smoking again ~ 07/2017 ; 1.5 ppd   Substance and Sexual Activity  .  Alcohol use: No    Alcohol/week: 0.0 standard drinks    Comment: rare  . Drug use: No  . Sexual activity: Not on file  Lifestyle  . Physical activity    Days per week: Not on file    Minutes per session: Not on file  . Stress: Not on file  Relationships  . Social Musicianconnections    Talks on phone: Not on file    Gets together: Not on file    Attends religious service: Not on file    Active member of club or organization: Not on file    Attends meetings of clubs or organizations: Not on file    Relationship status: Not on file  . Intimate partner violence    Fear of current or ex partner: Not on file    Emotionally abused: Not on file    Physically abused: Not on file    Forced sexual activity: Not on file  Other Topics Concern  . 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 Dementia Mother   . Colon cancer Neg Hx   . Stomach cancer Neg Hx      Allergies as of 01/01/2019      Reactions   Amoxicillin-pot Clavulanate    REACTION: rash/swelling/nausea   Cefaclor    Chantix [varenicline] Other (See Comments)   Aggression per Pt's wife   Januvia [sitagliptin]    Rash? Questionable, see OV 12-18-16      Medication List       Accurate as of January 01, 2019 11:59 PM. If you have any questions, ask your nurse or doctor.        amLODipine 10 MG tablet Commonly known as: NORVASC Take 1 tablet (10 mg total) by mouth daily.   aspirin 81 MG tablet Take 81 mg by mouth daily.   Insulin Pen Needle 32G X 6 MM Misc Commonly known as: BD Pen Needle Micro U/F Use w/ Ozempic   labetalol 300 MG tablet Commonly known as: NORMODYNE Take 1 tablet (300 mg total) by mouth 2 (two) times daily.   levothyroxine 125  MCG tablet Commonly known as: SYNTHROID Take 1 tablet (125 mcg total) by mouth daily before breakfast.   losartan-hydrochlorothiazide 100-25 MG tablet Commonly known as: HYZAAR Take 1 tablet by mouth daily.   metFORMIN 850 MG tablet Commonly known as: GLUCOPHAGE TAKE 2 TABLETS WITH BREAKFAST AND 1 TABLET WITH DINNER, DAILY   rosuvastatin 40 MG tablet Commonly known as: CRESTOR Take 1 tablet (40 mg total) by mouth daily.   Semaglutide(0.25 or 0.5MG /DOS) 2 MG/1.5ML Sopn Commonly known as: Ozempic (0.25 or 0.5 MG/DOSE) Inject 0.5 mg into the skin once a week.           Objective:   Physical Exam BP (!) 141/96 (BP Location: Left Arm, Patient Position: Sitting, Cuff Size: Small)   Pulse 76   Temp 98 F (36.7 C) (Oral)   Resp 16   Ht 5\' 11"  (1.803 m)   Wt 194 lb 6 oz (88.2 kg)   SpO2 96%   BMI 27.11 kg/m  General: Well developed, NAD, BMI noted Neck: No  thyromegaly  HEENT:  Normocephalic . Face symmetric, atraumatic Lungs:  CTA B Normal respiratory effort, no intercostal retractions, no accessory muscle use. Heart: RRR,  no murmur.  No pretibial edema bilaterally  Abdomen:  Not distended, soft, non-tender. No rebound or rigidity.  + Reducible umbilical hernia. Skin: Exposed areas without rash. Not pale. Not jaundice Feet exam: Normal pedal pulses, normal skin, pinprick examination normal. Neurologic:  alert & oriented X3.  Speech normal, gait appropriate for age and unassisted Strength symmetric and appropriate for age.  Psych: Cognition and judgment appear intact.  Cooperative with normal attention span and concentration.  Behavior appropriate. No anxious or depressed appearing     Assessment    Assessment  DM HTN Hyperlipidemia Hypothyroidism DJD Bell's palsy Leukocytosis, benign per Hem, monitoring Abnormal EKG 2009 Tobacco  Abuse   PLAN: DM: Currently on Victoza, will switch to Ozempic in few days.  He also takes metformin.  Check A1c High  cholesterol: On Crestor, checking labs HTN: On amlodipine, Normodyne, Hyzaar, BP today slightly elevated, no ambulatory BPs. Repeated BP 141/96.  Recommend no change but check ambulatory BPs, see AVS Hypothyroidism: Check a TSH  Coronary calcifications: Noted on CT for lung cancer screening, patient has multiple risk factors, he is not symptomatic however he has a sedentary lifestyle .  EKG today: NSR, no acute changes, no change from previous EKG.  Will refer to cardiology.  Further testing?Marland Kitchen. RTC 4 months  Today, in addition to a CPX, I spent more than 18 min with the patient: >50% of the time counseling regards chronic medical issues including diabetes, high cholesterol, hypertension, hypothyroidism and coronary calcifications.

## 2019-01-01 NOTE — Progress Notes (Signed)
Pre visit review using our clinic review tool, if applicable. No additional management support is needed unless otherwise documented below in the visit note. 

## 2019-01-01 NOTE — Assessment & Plan Note (Signed)
--  Tdap 2015 -prevnar- 2016 - PNM 23:  2014 - shingrex discussed , likes to check cost w/ insurance first - flu shot strongly recommended -- CCS 12-2003: Cscope , TICs, hyperplastic polyps  08-2013 Cscope no polyps, 10 years --H/o elevated PSA, had a  urology evaluation 2011; he is asx, DRE - PSA wnl 2019 --Diet: Counseling.  He is also somewhat sedentary, encourage a gradual exercise program --Heavy smoker, not ready to quit, counseled - lung cancer screening: LD CT 01/2018 neg, + Ca+ @ coronaries; repeat LD CT

## 2019-01-01 NOTE — Patient Instructions (Addendum)
GO TO THE LAB : Get the blood work     GO TO THE FRONT DESK Schedule your next appointment   a checkup in 4 months  Check the  blood pressure 2 or 3 times a month Goal is between 110/65 and  135/85. If it is consistently higher or lower, let me know    Steps to Quit Smoking Smoking tobacco is the leading cause of preventable death. It can affect almost every organ in the body. Smoking puts you and those around you at risk for developing many serious chronic diseases. Quitting smoking can be difficult, but it is one of the best things that you can do for your health. It is never too late to quit. How do I get ready to quit? When you decide to quit smoking, create a plan to help you succeed. Before you quit:  Pick a date to quit. Set a date within the next 2 weeks to give you time to prepare.  Write down the reasons why you are quitting. Keep this list in places where you will see it often.  Tell your family, friends, and co-workers that you are quitting. Support from your loved ones can make quitting easier.  Talk with your health care provider about your options for quitting smoking.  Find out what treatment options are covered by your health insurance.  Identify people, places, things, and activities that make you want to smoke (triggers). Avoid them. What first steps can I take to quit smoking?  Throw away all cigarettes at home, at work, and in your car.  Throw away smoking accessories, such as Scientist, research (medical).  Clean your car. Make sure to empty the ashtray.  Clean your home, including curtains and carpets. What strategies can I use to quit smoking? Talk with your health care provider about combining strategies, such as taking medicines while you are also receiving in-person counseling. Using these two strategies together makes you more likely to succeed in quitting than if you used either strategy on its own.  If you are pregnant or breastfeeding, talk with your  health care provider about finding counseling or other support strategies to quit smoking. Do not take medicine to help you quit smoking unless your health care provider tells you to do so. To quit smoking: Quit right away  Quit smoking completely, instead of gradually reducing how much you smoke over a period of time. Research shows that stopping smoking right away is more successful than gradually quitting.  Attend in-person counseling to help you build problem-solving skills. You are more likely to succeed in quitting if you attend counseling sessions regularly. Even short sessions of 10 minutes can be effective. Take medicine You may take medicines to help you quit smoking. Some medicines require a prescription and some you can purchase over-the-counter. Medicines may have nicotine in them to replace the nicotine in cigarettes. Medicines may:  Help to stop cravings.  Help to relieve withdrawal symptoms. Your health care provider may recommend:  Nicotine patches, gum, or lozenges.  Nicotine inhalers or sprays.  Non-nicotine medicine that is taken by mouth. Find resources Find resources and support systems that can help you to quit smoking and remain smoke-free after you quit. These resources are most helpful when you use them often. They include:  Online chats with a Social worker.  Telephone quitlines.  Printed Furniture conservator/restorer.  Support groups or group counseling.  Text messaging programs.  Mobile phone apps or applications. Use apps that can help you  stick to your quit plan by providing reminders, tips, and encouragement. There are many free apps for mobile devices as well as websites. Examples include Quit Guide from the [employer] and smokefree.gov What things can I do to make it easier to quit?   Reach out to your family and friends for support and encouragement. Call telephone quitlines (1-800-QUIT-NOW), reach out to support groups, or work with a counselor for support.  Ask  people who smoke to avoid smoking around you.  Avoid places that trigger you to smoke, such as bars, parties, or smoke-break areas at work.  Spend time with people who do not smoke.  Lessen the stress in your life. Stress can be a smoking trigger for some people. To lessen stress, try: ? Exercising regularly. ? Doing deep-breathing exercises. ? Doing yoga. ? Meditating. ? Performing a body scan. This involves closing your eyes, scanning your body from head to toe, and noticing which parts of your body are particularly tense. Try to relax the muscles in those areas. How will I feel when I quit smoking? Day 1 to 3 weeks Within the first 24 hours of quitting smoking, you may start to feel withdrawal symptoms. These symptoms are usually most noticeable 2-3 days after quitting, but they usually do not last for more than 2-3 weeks. You may experience these symptoms:  Mood swings.  Restlessness, anxiety, or irritability.  Trouble concentrating.  Dizziness.  Strong cravings for sugary foods and nicotine.  Mild weight gain.  Constipation.  Nausea.  Coughing or a sore throat.  Changes in how the medicines that you take for unrelated issues work in your body.  Depression.  Trouble sleeping (insomnia). Week 3 and afterward After the first 2-3 weeks of quitting, you may start to notice more positive results, such as:  Improved sense of smell and taste.  Decreased coughing and sore throat.  Slower heart rate.  Lower blood pressure.  Clearer skin.  The ability to breathe more easily.  Fewer sick days. Quitting smoking can be very challenging. Do not get discouraged if you are not successful the first time. Some people need to make many attempts to quit before they achieve long-term success. Do your best to stick to your quit plan, and talk with your health care provider if you have any questions or concerns. Summary  Smoking tobacco is the leading cause of preventable  death. Quitting smoking is one of the best things that you can do for your health.  When you decide to quit smoking, create a plan to help you succeed.  Quit smoking right away, not slowly over a period of time.  When you start quitting, seek help from your health care provider, family, or friends. This information is not intended to replace advice given to you by your health care provider. Make sure you discuss any questions you have with your health care provider. Document Released: 06/06/2001 Document Revised: 08/30/2018 Document Reviewed: 08/31/2018 Elsevier Patient Education  2020 [employer]

## 2019-01-02 ENCOUNTER — Ambulatory Visit (HOSPITAL_BASED_OUTPATIENT_CLINIC_OR_DEPARTMENT_OTHER): Payer: BC Managed Care – PPO

## 2019-01-02 NOTE — Assessment & Plan Note (Signed)
DM: Currently on Victoza, will switch to Ozempic in few days.  He also takes metformin.  Check A1c High cholesterol: On Crestor, checking labs HTN: On amlodipine, Normodyne, Hyzaar, BP today slightly elevated, no ambulatory BPs. Repeated BP 141/96.  Recommend no change but check ambulatory BPs, see AVS Hypothyroidism: Check a TSH Coronary calcifications: Noted on CT for lung cancer screening, patient has multiple risk factors, he is not symptomatic however he has a sedentary lifestyle .  EKG today: NSR, no acute changes, no change from previous EKG.  Will refer to cardiology.  Further testing?Marland Kitchen RTC 4 months

## 2019-01-08 ENCOUNTER — Other Ambulatory Visit: Payer: Self-pay

## 2019-01-08 ENCOUNTER — Encounter: Payer: Self-pay | Admitting: Cardiology

## 2019-01-08 ENCOUNTER — Ambulatory Visit: Payer: BC Managed Care – PPO | Admitting: Cardiology

## 2019-01-08 VITALS — BP 116/62 | HR 70 | Ht 71.0 in | Wt 194.0 lb

## 2019-01-08 DIAGNOSIS — I2510 Atherosclerotic heart disease of native coronary artery without angina pectoris: Secondary | ICD-10-CM

## 2019-01-08 DIAGNOSIS — F17200 Nicotine dependence, unspecified, uncomplicated: Secondary | ICD-10-CM

## 2019-01-08 DIAGNOSIS — E782 Mixed hyperlipidemia: Secondary | ICD-10-CM

## 2019-01-08 DIAGNOSIS — I10 Essential (primary) hypertension: Secondary | ICD-10-CM

## 2019-01-08 DIAGNOSIS — E088 Diabetes mellitus due to underlying condition with unspecified complications: Secondary | ICD-10-CM

## 2019-01-08 DIAGNOSIS — R0609 Other forms of dyspnea: Secondary | ICD-10-CM

## 2019-01-08 NOTE — Patient Instructions (Addendum)
Medication Instructions:  Your physician recommends that you continue on your current medications as directed. Please refer to the Current Medication list given to you today.  If you need a refill on your cardiac medications before your next appointment, please call your pharmacy.   Lab work: NONE If you have labs (blood work) drawn today and your tests are completely normal, you will receive your results only by: . MyChart Message (if you have MyChart) OR . A paper copy in the mail If you have any lab test that is abnormal or we need to change your treatment, we will call you to review the results.  Testing/Procedures: Your physician has requested that you have a lexiscan myoview. For further information please visit [email or web address]. Please follow instruction sheet, as given.   Follow-Up: At [facility], you and your health needs are our priority.  As part of our continuing mission to provide you with exceptional heart care, we have created designated Provider Care Teams.  These Care Teams include your primary Cardiologist (physician) and Advanced Practice Providers (APPs -  Physician Assistants and Nurse Practitioners) who all work together to provide you with the care you need, when you need it. You will need a follow up appointment in 6 months.   Any Other Special Instructions Will Be Listed Below Regadenoson injection What is this medicine? REGADENOSON is used to test the heart for coronary artery disease. It is used in patients who can not exercise for their stress test. This medicine may be used for other purposes; ask your health care provider or pharmacist if you have questions. COMMON BRAND NAME(S): Lexiscan What should I tell my health care provider before I take this medicine? They need to know if you have any of these conditions:  heart problems  lung or breathing disease, like asthma or COPD  an unusual or allergic reaction to regadenoson, other medicines, foods,  dyes, or preservatives  pregnant or trying to get pregnant  breast-feeding How should I use this medicine? This medicine is for injection into a vein. It is given by a health care professional in a hospital or clinic setting. Talk to your pediatrician regarding the use of this medicine in children. Special care may be needed. Overdosage: If you think you have taken too much of this medicine contact a poison control center or emergency room at once. NOTE: This medicine is only for you. Do not share this medicine with others. What if I miss a dose? This does not apply. What may interact with this medicine?  caffeine  dipyridamole  guarana  theophylline This list may not describe all possible interactions. Give your health care provider a list of all the medicines, herbs, non-prescription drugs, or dietary supplements you use. Also tell them if you smoke, drink alcohol, or use illegal drugs. Some items may interact with your medicine. What should I watch for while using this medicine? Your condition will be monitored carefully while you are receiving this medicine. Do not take medicines, foods, or drinks with caffeine (like coffee, tea, or colas) for at least 12 hours before your test. If you do not know if something contains caffeine, ask your health care professional. What side effects may I notice from receiving this medicine? Side effects that you should report to your doctor or health care professional as soon as possible:  allergic reactions like skin rash, itching or hives, swelling of the face, lips, or tongue  breathing problems  chest pain, tightness or palpitations  severe   headache Side effects that usually do not require medical attention (report to your doctor or health care professional if they continue or are bothersome):  flushing  headache  irritation or pain at site where injected  nausea, vomiting This list may not describe all possible side effects. Call  your doctor for medical advice about side effects. You may report side effects to FDA at 1-800-FDA-1088. Where should I keep my medicine? This drug is given in a hospital or clinic and will not be stored at home. NOTE: This sheet is a summary. It may not cover all possible information. If you have questions about this medicine, talk to your doctor, pharmacist, or health care provider.  2020 Elsevier/Gold Standard (2008-02-10 15:08:13)  Cardiac Nuclear Scan A cardiac nuclear scan is a test that is done to check the flow of blood to your heart. It is done when you are resting and when you are exercising. The test looks for problems such as:  Not enough blood reaching a portion of the heart.  The heart muscle not working as it should. You may need this test if:  You have heart disease.  You have had lab results that are not normal.  You have had heart surgery or a balloon procedure to open up blocked arteries (angioplasty).  You have chest pain.  You have shortness of breath. In this test, a special dye (tracer) is put into your bloodstream. The tracer will travel to your heart. A camera will then take pictures of your heart to see how the tracer moves through your heart. This test is usually done at a hospital and takes 2-4 hours. Tell a doctor about:  Any allergies you have.  All medicines you are taking, including vitamins, herbs, eye drops, creams, and over-the-counter medicines.  Any problems you or family members have had with anesthetic medicines.  Any blood disorders you have.  Any surgeries you have had.  Any medical conditions you have.  Whether you are pregnant or may be pregnant. What are the risks? Generally, this is a safe test. However, problems may occur, such as:  Serious chest pain and heart attack. This is only a risk if the stress portion of the test is done.  Rapid heartbeat.  A feeling of warmth in your chest. This feeling usually does not last long.   Allergic reaction to the tracer. What happens before the test?  Ask your doctor about changing or stopping your normal medicines. This is important.  Follow instructions from your doctor about what you cannot eat or drink.  Remove your jewelry on the day of the test. What happens during the test?  An IV tube will be inserted into one of your veins.  Your doctor will give you a small amount of tracer through the IV tube.  You will wait for 20-40 minutes while the tracer moves through your bloodstream.  Your heart will be monitored with an electrocardiogram (ECG).  You will lie down on an exam table.  Pictures of your heart will be taken for about 15-20 minutes.  You may also have a stress test. For this test, one of these things may be done: ? You will be asked to exercise on a treadmill or a stationary bike. ? You will be given medicines that will make your heart work harder. This is done if you are unable to exercise.  When blood flow to your heart has peaked, a tracer will again be given through the IV tube.    After 20-40 minutes, you will get back on the exam table. More pictures will be taken of your heart.  Depending on the tracer that is used, more pictures may need to be taken 3-4 hours later.  Your IV tube will be removed when the test is over. The test may vary among doctors and hospitals. What happens after the test?  Ask your doctor: ? Whether you can return to your normal schedule, including diet, activities, and medicines. ? Whether you should drink more fluids. This will help to remove the tracer from your body. Drink enough fluid to keep your pee (urine) pale yellow.  Ask your doctor, or the department that is doing the test: ? When will my results be ready? ? How will I get my results? Summary  A cardiac nuclear scan is a test that is done to check the flow of blood to your heart.  Tell your doctor whether you are pregnant or may be pregnant.  Before  the test, ask your doctor about changing or stopping your normal medicines. This is important.  Ask your doctor whether you can return to your normal activities. You may be asked to drink more fluids. This information is not intended to replace advice given to you by your health care provider. Make sure you discuss any questions you have with your health care provider. Document Released: 11/26/2017 Document Revised: 10/02/2018 Document Reviewed: 11/26/2017 Elsevier Patient Education  2020 Elsevier Inc.   

## 2019-01-08 NOTE — Progress Notes (Signed)
Cardiology Office Note:    Date:  01/08/2019   ID:  Alex Phillips, DOB March 05, 1961, MRN [id number]  PCP:  Colon Branch, MD  Cardiologist:  Jenean Lindau, MD   Referring MD: Colon Branch, MD    ASSESSMENT:    1. TOBACCO ABUSE   2. Atherosclerosis of native coronary artery of native heart without angina pectoris   3. Essential hypertension   4. Mixed dyslipidemia   5. Diabetes mellitus due to underlying condition with unspecified complications ([place])    PLAN:    In order of problems listed above:  1. Coronary artery disease: Seen on CT scanning.  Patient has some shortness of breath on exertion.  Alex Phillips leads a sedentary lifestyle.  I think it would be appropriate to do a Lexiscan sestamibi to rule out any objective evidence of coronary artery secondary prevention stressed.  Importance of compliance with diet and medication stressed and Alex Phillips vocalized understanding. 2. Essential hypertension: Blood pressure stable 3. Mixed dyslipidemia: Diet was discussed lipids were reviewed with the patient 4. Diabetes mellitus: Managed by primary care physician 5. Cigarette smoking: I spent 5 minutes with the patient discussing solely about smoking. Smoking cessation was counseled. I suggested to the patient also different medications and pharmacological interventions. Patient is keen to try stopping on its own at this time. Alex Phillips will get back to me if Alex Phillips needs any further assistance in this matter. 6. Patient will be seen in follow-up appointment in 6 months or earlier if the patient has any concerns    Medication Adjustments/Labs and Tests Ordered: Current medicines are reviewed at length with the patient today.  Concerns regarding medicines are outlined above.  No orders of the defined types were placed in this encounter.  No orders of the defined types were placed in this encounter.    History of Present Illness:    Alex Phillips is a 58 y.o. male who is being seen today for the evaluation  of coronary atherosclerosis and dyspnea on exertion at the request of Colon Branch, MD.  Patient is a pleasant 58 year old male.  Alex Phillips has past medical history of essential hypertension, dyslipidemia, diabetes mellitus and smoking.  Alex Phillips is found to have coronary atherosclerosis on CT evaluation.  Alex Phillips has some shortness of breath on exertion.  Alex Phillips leads a sedentary lifestyle.  Alex Phillips was sent here for evaluation for possible stress test.  At the time of my evaluation, the patient is alert awake oriented and in no distress.  Past Medical History:  Diagnosis Date  . Bell's palsy   . Diabetes mellitus 11/09   A1c- 6.2  . DJD (degenerative joint disease)    l knee  . EKG, abnormal 12-2007   Stress test (-)   . Elevated PSA 2010   saw urology, PSA was reched and found to be normal. Bx was canceled, did get a u/s d/t michrohematuris- normal  . Elevated WBC count    saw Dr.Enenever; likely benign, monitor  . HTN (hypertension) 11/14/2006  . Hyperlipidemia   . Hypertension   . Hypothyroidism     Past Surgical History:  Procedure Laterality Date  . MOUTH SURGERY     crown extention on right upper   . TONSILLECTOMY AND ADENOIDECTOMY    . TYMPANOSTOMY TUBE PLACEMENT     B as a child  . VASECTOMY  02/2009    Current Medications: Current Meds  Medication Sig  . amLODipine (NORVASC) 10 MG tablet Take 1 tablet (10  mg total) by mouth daily.  Marland Kitchen. aspirin 81 MG tablet Take 81 mg by mouth daily.    . Insulin Pen Needle (BD PEN NEEDLE MICRO U/F) 32G X 6 MM MISC Use w/ Ozempic  . labetalol (NORMODYNE) 300 MG tablet Take 1 tablet (300 mg total) by mouth 2 (two) times daily.  Marland Kitchen. levothyroxine (SYNTHROID) 125 MCG tablet Take 1 tablet (125 mcg total) by mouth daily before breakfast.  . losartan-hydrochlorothiazide (HYZAAR) 100-25 MG tablet Take 1 tablet by mouth daily.  . metFORMIN (GLUCOPHAGE) 850 MG tablet TAKE 2 TABLETS WITH BREAKFAST AND 1 TABLET WITH DINNER, DAILY  . rosuvastatin (CRESTOR) 40 MG tablet Take 1  tablet (40 mg total) by mouth daily.  . Semaglutide,0.25 or 0.5MG /DOS, (OZEMPIC, 0.25 OR 0.5 MG/DOSE,) 2 MG/1.5ML SOPN Inject 0.5 mg into the skin once a week.     Allergies:   Amoxicillin-pot clavulanate, Cefaclor, Chantix [varenicline], and Januvia [sitagliptin]   Social History   Socioeconomic History  . Marital status: Married    Spouse name: Not on file  . Number of children: 0  . Years of education: Not on file  . Highest education level: Not on file  Occupational History  . Occupation: Designer, industrial/productpecial Project manager   . 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 Financial resource strain: Not on file  . Food insecurity    Worry: Not on file    Inability: Not on file  . Transportation needs    Medical: Not on file    Non-medical: Not on file  Tobacco Use  . Smoking status: Current Every Day Smoker    Packs/day: 1.50    Years: 34.00    Pack years: 51.00    Types: Cigarettes    Start date: 08/30/1981    Last attempt to quit: 10/25/2015    Years since quitting: 3.2  . Smokeless tobacco: Never Used  . Tobacco comment: smoking again ~ 07/2017 ; 1.5 ppd   Substance and Sexual Activity  . Alcohol use: No    Alcohol/week: 0.0 standard drinks    Comment: rare  . Drug use: No  . Sexual activity: Not on file  Lifestyle  . Physical activity    Days per week: Not on file    Minutes per session: Not on file  . Stress: Not on file  Relationships  . Social Musicianconnections    Talks on phone: Not on file    Gets together: Not on file    Attends religious service: Not on file    Active member of club or organization: Not on file    Attends meetings of clubs or organizations: Not on file    Relationship status: Not on file  Other Topics Concern  . 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 There is no history of Colon cancer or Stomach cancer. Alex Phillips was adopted.  ROS:   Please see the history of present illness.    All other systems reviewed and are negative.  EKGs/Labs/Other Studies Reviewed:    The following studies were reviewed today: EKG reveals sinus rhythm and nonspecific ST-T changes   Recent Labs: 01/01/2019: ALT  21; BUN 13; Creatinine, Ser 1.20; Hemoglobin 15.8; Platelets 198.0; Potassium 3.7; Sodium 136; TSH 4.31  Recent Lipid Panel    Component Value Date/Time   CHOL 97 01/01/2019 1021   TRIG 155.0 (H) 01/01/2019 1021   TRIG 103 05/21/2006 0000   HDL 30.20 (L) 01/01/2019 1021   CHOLHDL 3 01/01/2019 1021   VLDL 31.0 01/01/2019 1021   LDLCALC 35 01/01/2019 1021    Physical Exam:    VS:  BP 116/62 (BP Location: Left Arm, Patient Position: Sitting, Cuff Size: Normal)   Pulse 70   Ht 5\' 11"  (1.803 m)   Wt 194 lb (88 kg)   SpO2 97%   BMI 27.06 kg/m     Wt Readings from Last 3 Encounters:  01/08/19 194 lb (88 kg)  01/01/19 194 lb 6 oz (88.2 kg)  06/25/18 192 lb 6 oz (87.3 kg)     GEN: Patient is in no acute distress HEENT: Normal NECK: No JVD; No carotid bruits LYMPHATICS: No lymphadenopathy CARDIAC: S1 S2 regular, 2/6 systolic murmur at the apex. RESPIRATORY:  Clear to auscultation without rales, wheezing or rhonchi  ABDOMEN: Soft, non-tender, non-distended MUSCULOSKELETAL:  No edema; No deformity  SKIN: Warm and dry NEUROLOGIC:  Alert and oriented x 3 PSYCHIATRIC:  Normal affect    Signed, Garwin Brothersajan R Revankar, MD  01/08/2019 10:11 AM    [facility]

## 2019-01-15 DIAGNOSIS — M9903 Segmental and somatic dysfunction of lumbar region: Secondary | ICD-10-CM | POA: Diagnosis not present

## 2019-01-15 DIAGNOSIS — M5386 Other specified dorsopathies, lumbar region: Secondary | ICD-10-CM | POA: Diagnosis not present

## 2019-01-15 DIAGNOSIS — M9905 Segmental and somatic dysfunction of pelvic region: Secondary | ICD-10-CM | POA: Diagnosis not present

## 2019-01-15 DIAGNOSIS — M9902 Segmental and somatic dysfunction of thoracic region: Secondary | ICD-10-CM | POA: Diagnosis not present

## 2019-01-16 ENCOUNTER — Telehealth (HOSPITAL_COMMUNITY): Payer: Self-pay | Admitting: *Deleted

## 2019-01-16 NOTE — Telephone Encounter (Signed)
Left message on voicemail in reference to upcoming appointment scheduled for 01/20/19 Phone number given for a call back so details instructions can be given.  Alex Phillips [name]   

## 2019-01-17 ENCOUNTER — Encounter (HOSPITAL_COMMUNITY): Payer: Self-pay | Admitting: *Deleted

## 2019-01-17 ENCOUNTER — Telehealth (HOSPITAL_COMMUNITY): Payer: Self-pay | Admitting: *Deleted

## 2019-01-17 NOTE — Telephone Encounter (Signed)
Left message on voicemail in reference to upcoming appointment scheduled for 01/20/19 Phone number given for a call back so details instructions can be given.  Alex Phillips [name]   

## 2019-01-20 ENCOUNTER — Ambulatory Visit (HOSPITAL_COMMUNITY): Payer: BC Managed Care – PPO | Attending: Cardiovascular Disease

## 2019-01-20 ENCOUNTER — Other Ambulatory Visit: Payer: Self-pay

## 2019-01-20 VITALS — Ht 71.0 in | Wt 194.0 lb

## 2019-01-20 DIAGNOSIS — R0609 Other forms of dyspnea: Secondary | ICD-10-CM

## 2019-01-20 DIAGNOSIS — I2510 Atherosclerotic heart disease of native coronary artery without angina pectoris: Secondary | ICD-10-CM | POA: Diagnosis not present

## 2019-01-20 LAB — MYOCARDIAL PERFUSION IMAGING
LV dias vol: 94 mL (ref 62–150)
LV sys vol: 35 mL
Peak HR: 106 {beats}/min
Rest HR: 78 {beats}/min
SDS: 1
SRS: 0
SSS: 1
TID: 1.08

## 2019-01-20 MED ORDER — TECHNETIUM TC 99M TETROFOSMIN IV KIT
32.7000 | PACK | Freq: Once | INTRAVENOUS | Status: AC | PRN
  Administered 2019-01-20: 32.7 via INTRAVENOUS
  Filled 2019-01-20: qty 33

## 2019-01-20 MED ORDER — TECHNETIUM TC 99M TETROFOSMIN IV KIT
10.5000 | PACK | Freq: Once | INTRAVENOUS | Status: AC | PRN
  Administered 2019-01-20: 10.5 via INTRAVENOUS
  Filled 2019-01-20: qty 11

## 2019-01-20 MED ORDER — REGADENOSON 0.4 MG/5ML IV SOLN
0.4000 mg | Freq: Once | INTRAVENOUS | Status: AC
  Administered 2019-01-20: 0.4 mg via INTRAVENOUS

## 2019-01-23 ENCOUNTER — Telehealth: Payer: Self-pay

## 2019-01-23 NOTE — Telephone Encounter (Signed)
-----   Message from Jenean Lindau, MD sent at 01/21/2019 10:04 AM EDT ----- Needs elective appt to discuss in a week or two. Make sure he take coated baby ecasa everyday and also has NTG Jenean Lindau, MD 01/21/2019 10:04 AM

## 2019-01-23 NOTE — Telephone Encounter (Signed)
Lexi results discussed, patient is currently taking aspirin 81 mg. He declines to RN sending in a script for nitroglycerin and would like to discuss at f/u visit. Scheduled for 01/28/19.

## 2019-01-27 ENCOUNTER — Other Ambulatory Visit: Payer: Self-pay

## 2019-01-27 ENCOUNTER — Ambulatory Visit (HOSPITAL_BASED_OUTPATIENT_CLINIC_OR_DEPARTMENT_OTHER)
Admission: RE | Admit: 2019-01-27 | Discharge: 2019-01-27 | Disposition: A | Discharge status: Home / Self Care | Payer: BC Managed Care – PPO | Source: Ambulatory Visit | Attending: Internal Medicine | Admitting: Internal Medicine

## 2019-01-27 DIAGNOSIS — F17210 Nicotine dependence, cigarettes, uncomplicated: Secondary | ICD-10-CM | POA: Diagnosis not present

## 2019-01-27 DIAGNOSIS — Z122 Encounter for screening for malignant neoplasm of respiratory organs: Secondary | ICD-10-CM | POA: Diagnosis not present

## 2019-01-28 ENCOUNTER — Encounter: Payer: Self-pay | Admitting: Cardiology

## 2019-01-28 ENCOUNTER — Ambulatory Visit (INDEPENDENT_AMBULATORY_CARE_PROVIDER_SITE_OTHER): Payer: BC Managed Care – PPO | Admitting: Cardiology

## 2019-01-28 VITALS — BP 152/98 | HR 76 | Ht 72.0 in | Wt 194.0 lb

## 2019-01-28 DIAGNOSIS — I2510 Atherosclerotic heart disease of native coronary artery without angina pectoris: Secondary | ICD-10-CM | POA: Diagnosis not present

## 2019-01-28 DIAGNOSIS — E088 Diabetes mellitus due to underlying condition with unspecified complications: Secondary | ICD-10-CM

## 2019-01-28 DIAGNOSIS — I10 Essential (primary) hypertension: Secondary | ICD-10-CM

## 2019-01-28 DIAGNOSIS — R931 Abnormal findings on diagnostic imaging of heart and coronary circulation: Secondary | ICD-10-CM | POA: Diagnosis not present

## 2019-01-28 DIAGNOSIS — E782 Mixed hyperlipidemia: Secondary | ICD-10-CM

## 2019-01-28 NOTE — Patient Instructions (Signed)
Medication Instructions:  NONE  If you need a refill on your cardiac medications before your next appointment, please call your pharmacy.   Lab work: NONE  If you have labs (blood work) drawn today and your tests are completely normal, you will receive your results only by: Marland Kitchen MyChart Message (if you have MyChart) OR . A paper copy in the mail If you have any lab test that is abnormal or we need to change your treatment, we will call you to review the results.  Testing/Procedures: NONE  Follow-Up: At [facility] you and your health needs are our priority.  As part of our continuing mission to provide you with exceptional heart care, we have created designated Provider Care Teams.  These Care Teams include your primary Cardiologist (physician) and Advanced Practice Providers (APPs -  Physician Assistants and Nurse Practitioners) who all work together to provide you with the care you need, when you need it. . You will need a follow up appointment AS NEEDED.  Please call our office 2 months in advance to schedule this appointment with Dr. Geraldo Pitter  Any Other Special Instructions Will Be Listed Below (If Applicable). NONE

## 2019-01-28 NOTE — Progress Notes (Signed)
Cardiology Office Note:    Date:  01/28/2019   ID:  Alex Phillips, DOB May 18, 1961, MRN [id number]  PCP:  Wanda Plump, MD  Cardiologist:  Garwin Brothers, MD   Referring MD: Wanda Plump, MD    ASSESSMENT:    1. Abnormal nuclear cardiac imaging test   2. Atherosclerosis of native coronary artery of native heart without angina pectoris   3. Essential hypertension   4. Mixed dyslipidemia   5. Diabetes mellitus due to underlying condition with unspecified complications (HCC)    PLAN:    In order of problems listed above:  1. Abnormal nuclear imaging stress test: Patient had evidence of coronary calcifications on CT scans.  In view of multiple risk factors patient underwent stress testing.  His stress test revealed evidence of ischemia.  The patient mentioned to me on today's visit that he has no shortness of breath on exertion.  I told him that I will put this in the record.  He also mentions to me that he leads a sedentary lifestyle.  In view of the abnormal stress test I gave the patient an option of conventional coronary angiography versus CT coronary angiography.  Benefits and potential risks of each modality was discussed with the patient.  I also discussed with him aggressive medical management.  Patient mentions to have a second opinion.  He is taking aspirin on a regular basis.  He refuses to have a prescription for sublingual nitroglycerin prescription was sent, its protocol and 911 protocol explained and the patient vocalized understanding questions were answered to the patient's satisfaction and I respect his feelings.  I mentioned to him that I will be ready to assist him in any way I can I also mentioned to him that he can find a doctor of his choice for his second opinion or I can refer him to another cardiologist of my group.  He prefers the former.  I told him that if any records need to be transferred we will be glad to do so.  He also wants follow-up on a as needed basis.   Importance of compliance with diet medication stressed.  His blood pressure is a little elevated today but this is probably secondary to above issues going on and he will keep a track of it.  He will be seen in follow-up on a as needed basis only as per his wishes.  He knows to go to the nearest emergency room for any concern.  He had multiple questions which were answered to his satisfaction.  I again told him to refrain from smoking.   Medication Adjustments/Labs and Tests Ordered: Current medicines are reviewed at length with the patient today.  Concerns regarding medicines are outlined above.  No orders of the defined types were placed in this encounter.  No orders of the defined types were placed in this encounter.    No chief complaint on file.    History of Present Illness:    Alex Phillips is a 58 y.o. male.  Patient has past medical history of essential hypertension, dyslipidemia and diabetes mellitus.  He has history of smoking.  He was evaluated because the CT scan revealed coronary atherosclerosis.  As mentioned below the stress test revealed evidence of ischemia.  The patient is here for follow-up.  He mentions to me that he reviewed my notes and he says that he does not have shortness of breath on exertion.  No orthopnea or PND.  He denies any  chest pain.  At the time of my evaluation, the patient is alert awake oriented and in no distress.  Past Medical History:  Diagnosis Date   Bell's palsy    Diabetes mellitus 11/09   A1c- 6.2   DJD (degenerative joint disease)    l knee   EKG, abnormal 12-2007   Stress test (-)    Elevated PSA 2010   saw urology, PSA was reched and found to be normal. Bx was canceled, did get a u/s d/t michrohematuris- normal   Elevated WBC count    saw Dr.Enenever; likely benign, monitor   HTN (hypertension) 11/14/2006   Hyperlipidemia    Hypertension    Hypothyroidism     Past Surgical History:  Procedure Laterality Date   MOUTH  SURGERY     crown extention on right upper    TONSILLECTOMY AND ADENOIDECTOMY     TYMPANOSTOMY TUBE PLACEMENT     B as a child   VASECTOMY  02/2009    Current Medications: Current Meds  Medication Sig   amLODipine (NORVASC) 10 MG tablet Take 1 tablet (10 mg total) by mouth daily.   aspirin 81 MG tablet Take 81 mg by mouth daily.     Insulin Pen Needle (BD PEN NEEDLE MICRO U/F) 32G X 6 MM MISC Use w/ Ozempic   labetalol (NORMODYNE) 300 MG tablet Take 1 tablet (300 mg total) by mouth 2 (two) times daily.   levothyroxine (SYNTHROID) 125 MCG tablet Take 1 tablet (125 mcg total) by mouth daily before breakfast.   losartan-hydrochlorothiazide (HYZAAR) 100-25 MG tablet Take 1 tablet by mouth daily.   metFORMIN (GLUCOPHAGE) 850 MG tablet TAKE 2 TABLETS WITH BREAKFAST AND 1 TABLET WITH DINNER, DAILY   rosuvastatin (CRESTOR) 40 MG tablet Take 1 tablet (40 mg total) by mouth daily.   Semaglutide,0.25 or 0.5MG /DOS, (OZEMPIC, 0.25 OR 0.5 MG/DOSE,) 2 MG/1.5ML SOPN Inject 0.5 mg into the skin once a week.     Allergies:   Amoxicillin-pot clavulanate, Cefaclor, Chantix [varenicline], and Januvia [sitagliptin]   Social History   Socioeconomic History   Marital status: Married    Spouse name: Not on file   Number of children: 0   Years of education: Not on file   Highest education level: Not on file  Occupational History   Occupation: Designer, industrial/product    Occupation: Retired 03-2018  Ecologist strain: Not on file   Food insecurity    Worry: Not on file    Inability: Not on file   Transportation needs    Medical: Not on file    Non-medical: Not on file  Tobacco Use   Smoking status: Current Every Day Smoker    Packs/day: 1.50    Years: 34.00    Pack years: 51.00    Types: Cigarettes    Start date: 08/30/1981    Last attempt to quit: 10/25/2015    Years since quitting: 3.2   Smokeless tobacco: Never Used   Tobacco comment: smoking  again ~ 07/2017 ; 1.5 ppd   Substance and Sexual Activity   Alcohol use: No    Alcohol/week: 0.0 standard drinks    Comment: rare   Drug use: No   Sexual activity: Not on file  Lifestyle   Physical activity    Days per week: Not on file    Minutes per session: Not on file   Stress: Not on file  Relationships   Social connections    Talks on  phone: Not on file    Gets together: Not on file    Attends religious service: Not on file    Active member of club or organization: Not on file    Attends meetings of clubs or organizations: Not on file    Relationship status: Not on file  Other Topics Concern   Not on file  Social History Narrative   Household: wife, 5 step kids total , 2 at home  (one is Down syndrome       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 There is no history of Colon cancer or Stomach cancer. He was adopted.  ROS:   Please see the history of present illness.    All other systems reviewed and are negative.  EKGs/Labs/Other Studies Reviewed:    The following studies were reviewed today: Study Highlights    Nuclear stress EF: 63%.  There was no ST segment deviation noted during stress.  Findings consistent with ischemia.  This is a low risk study.  The left ventricular ejection fraction is normal (55-65%).   Small area of mild ischemia involving the mid and apical inferior wall EF preserved 63%    Recent Labs: 01/01/2019: ALT 21; BUN 13; Creatinine, Ser 1.20; Hemoglobin 15.8; Platelets 198.0; Potassium 3.7; Sodium 136; TSH 4.31  Recent Lipid Panel    Component Value Date/Time   CHOL 97 01/01/2019 1021   TRIG 155.0 (H) 01/01/2019 1021   TRIG 103 05/21/2006 0000   HDL 30.20 (L) 01/01/2019 1021   CHOLHDL 3 01/01/2019 1021   VLDL 31.0 01/01/2019 1021   LDLCALC 35  01/01/2019 1021    Physical Exam:    VS:  BP (!) 152/98    Pulse 76    Ht 6' (1.829 m)    Wt 194 lb (88 kg)    SpO2 96%    BMI 26.31 kg/m     Wt Readings from Last 3 Encounters:  01/28/19 194 lb (88 kg)  01/20/19 194 lb (88 kg)  01/08/19 194 lb (88 kg)     GEN: Patient is in no acute distress HEENT: Normal NECK: No JVD; No carotid bruits LYMPHATICS: No lymphadenopathy CARDIAC: Hear sounds regular, 2/6 systolic murmur at the apex. RESPIRATORY:  Clear to auscultation without rales, wheezing or rhonchi  ABDOMEN: Soft, non-tender, non-distended MUSCULOSKELETAL:  No edema; No deformity  SKIN: Warm and dry NEUROLOGIC:  Alert and oriented x 3 PSYCHIATRIC:  Normal affect   Signed, Garwin Brothers, MD  01/28/2019 8:44 AM    [facility]

## 2019-02-26 DIAGNOSIS — M9905 Segmental and somatic dysfunction of pelvic region: Secondary | ICD-10-CM | POA: Diagnosis not present

## 2019-02-26 DIAGNOSIS — M5386 Other specified dorsopathies, lumbar region: Secondary | ICD-10-CM | POA: Diagnosis not present

## 2019-02-26 DIAGNOSIS — M9903 Segmental and somatic dysfunction of lumbar region: Secondary | ICD-10-CM | POA: Diagnosis not present

## 2019-02-26 DIAGNOSIS — M9902 Segmental and somatic dysfunction of thoracic region: Secondary | ICD-10-CM | POA: Diagnosis not present

## 2019-03-04 ENCOUNTER — Other Ambulatory Visit: Payer: Self-pay

## 2019-03-04 MED ORDER — OZEMPIC (0.25 OR 0.5 MG/DOSE) 2 MG/1.5ML ~~LOC~~ SOPN: 0.5000 mg | PEN_INJECTOR | SUBCUTANEOUS | 1 refills | Status: DC

## 2019-03-28 ENCOUNTER — Other Ambulatory Visit: Payer: Self-pay | Admitting: Internal Medicine

## 2019-03-29 ENCOUNTER — Other Ambulatory Visit: Payer: Self-pay | Admitting: Internal Medicine

## 2019-04-01 ENCOUNTER — Other Ambulatory Visit: Payer: Self-pay | Admitting: Internal Medicine

## 2019-04-09 DIAGNOSIS — M9903 Segmental and somatic dysfunction of lumbar region: Secondary | ICD-10-CM | POA: Diagnosis not present

## 2019-04-09 DIAGNOSIS — M9905 Segmental and somatic dysfunction of pelvic region: Secondary | ICD-10-CM | POA: Diagnosis not present

## 2019-04-09 DIAGNOSIS — M9902 Segmental and somatic dysfunction of thoracic region: Secondary | ICD-10-CM | POA: Diagnosis not present

## 2019-04-09 DIAGNOSIS — M5386 Other specified dorsopathies, lumbar region: Secondary | ICD-10-CM | POA: Diagnosis not present

## 2019-04-12 ENCOUNTER — Other Ambulatory Visit: Payer: Self-pay | Admitting: Internal Medicine

## 2019-04-19 ENCOUNTER — Other Ambulatory Visit: Payer: Self-pay | Admitting: Internal Medicine

## 2019-05-06 ENCOUNTER — Ambulatory Visit: Payer: BC Managed Care – PPO | Admitting: Internal Medicine

## 2019-05-06 ENCOUNTER — Other Ambulatory Visit: Payer: Self-pay

## 2019-05-06 ENCOUNTER — Encounter: Payer: Self-pay | Admitting: Internal Medicine

## 2019-05-06 VITALS — BP 125/81 | HR 74 | Temp 96.7°F | Resp 16 | Ht 72.0 in | Wt 184.4 lb

## 2019-05-06 DIAGNOSIS — E039 Hypothyroidism, unspecified: Secondary | ICD-10-CM | POA: Diagnosis not present

## 2019-05-06 DIAGNOSIS — E1165 Type 2 diabetes mellitus with hyperglycemia: Secondary | ICD-10-CM

## 2019-05-06 DIAGNOSIS — I10 Essential (primary) hypertension: Secondary | ICD-10-CM | POA: Diagnosis not present

## 2019-05-06 DIAGNOSIS — Z794 Long term (current) use of insulin: Secondary | ICD-10-CM

## 2019-05-06 DIAGNOSIS — Z09 Encounter for follow-up examination after completed treatment for conditions other than malignant neoplasm: Secondary | ICD-10-CM

## 2019-05-06 DIAGNOSIS — I2510 Atherosclerotic heart disease of native coronary artery without angina pectoris: Secondary | ICD-10-CM

## 2019-05-06 LAB — BASIC METABOLIC PANEL
BUN: 14 mg/dL (ref 6–23)
CO2: 29 mEq/L (ref 19–32)
Calcium: 9.2 mg/dL (ref 8.4–10.5)
Chloride: 101 mEq/L (ref 96–112)
Creatinine, Ser: 1.08 mg/dL (ref 0.40–1.50)
GFR: 70.25 mL/min (ref >60.00)
Glucose, Bld: 99 mg/dL (ref 70–99)
Potassium: 3.5 mEq/L (ref 3.5–5.1)
Sodium: 141 mEq/L (ref 135–145)

## 2019-05-06 LAB — HEMOGLOBIN A1C: Hgb A1c MFr Bld: 6.2 % (ref 4.6–6.5)

## 2019-05-06 LAB — TSH: TSH: 2.95 u[IU]/mL (ref 0.35–4.50)

## 2019-05-06 MED ORDER — NITROGLYCERIN 0.4 MG SL SUBL: 0.4000 mg | SUBLINGUAL_TABLET | SUBLINGUAL | 3 refills | Status: AC | PRN

## 2019-05-06 NOTE — Progress Notes (Signed)
Pre visit review using our clinic review tool, if applicable. No additional management support is needed unless otherwise documented below in the visit note. 

## 2019-05-06 NOTE — Progress Notes (Addendum)
Subjective:    Patient ID: Alex Phillips, male    DOB: Jun 10, 1961, 58 y.o.   MRN: [id number]  DOS:  05/06/2019 Type of visit - description: Routine visit DM: No amb CBGs.  Currently on Ozempic HTN: Controlled, BP today is very good CAD: See below. Tobacco: Not ready to quit    Review of Systems Denies chest pain, difficulty breathing or edema  Past Medical History:  Diagnosis Date  . Bell's palsy   . Diabetes mellitus 11/09   A1c- 6.2  . DJD (degenerative joint disease)    l knee  . EKG, abnormal 12-2007   Stress test (-)   . Elevated PSA 2010   saw urology, PSA was reched and found to be normal. Bx was canceled, did get a u/s d/t michrohematuris- normal  . Elevated WBC count    saw Dr.Enenever; likely benign, monitor  . HTN (hypertension) 11/14/2006  . Hyperlipidemia   . Hypertension   . Hypothyroidism     Past Surgical History:  Procedure Laterality Date  . MOUTH SURGERY     crown extention on right upper   . TONSILLECTOMY AND ADENOIDECTOMY    . TYMPANOSTOMY TUBE PLACEMENT     B as a child  . VASECTOMY  02/2009    Social History   Socioeconomic History  . Marital status: Married    Spouse name: Not on file  . Number of children: 0  . Years of education: Not on file  . Highest education level: Not on file  Occupational History  . Occupation: Designer, industrial/product   . 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 Financial resource strain: Not on file  . Food insecurity    Worry: Not on file    Inability: Not on file  . Transportation needs    Medical: Not on file    Non-medical: Not on file  Tobacco Use  . Smoking status: Current Every Day Smoker    Packs/day: 1.50    Years: 34.00    Pack years: 51.00    Types: Cigarettes    Start date: 08/30/1981    Last attempt to quit: 10/25/2015    Years since quitting: 3.5  . Smokeless tobacco: Never Used  . Tobacco comment: smoking again ~ 07/2017 ; 1.5 ppd   Substance and Sexual Activity  . Alcohol  use: No    Alcohol/week: 0.0 standard drinks    Comment: rare  . Drug use: No  . Sexual activity: Not on file  Lifestyle  . Physical activity    Days per week: Not on file    Minutes per session: Not on file  . Stress: Not on file  Relationships  . Social Musician on phone: Not on file    Gets together: Not on file    Attends religious service: Not on file    Active member of club or organization: Not on file    Attends meetings of clubs or organizations: Not on file    Relationship status: Not on file  . Intimate partner violence    Fear of current or ex partner: Not on file    Emotionally abused: Not on file    Physically abused: Not on file    Forced sexual activity: Not on file  Other Topics Concern  . 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 Questionable, see OV 12-18-16      Medication List       Accurate as of May 06, 2019 11:59 PM. If you have any questions, ask your nurse or doctor.        amLODipine 10 MG tablet Commonly known as: NORVASC Take 1 tablet (10 mg total) by mouth daily.   aspirin 81 MG tablet Take 81 mg by mouth daily.   Insulin Pen Needle 32G X 6 MM Misc Commonly known as: BD Pen Needle Micro U/F Use w/ Ozempic   labetalol 300 MG tablet Commonly known as: NORMODYNE Take 1 tablet (300 mg total) by mouth 2 (two) times daily.   levothyroxine 125 MCG tablet Commonly known as: SYNTHROID Take 1 tablet (125 mcg total) by mouth daily before breakfast.   losartan-hydrochlorothiazide 100-25  MG tablet Commonly known as: HYZAAR Take 1 tablet by mouth daily.   metFORMIN 850 MG tablet Commonly known as: GLUCOPHAGE TAKE 2 TABLETS WITH BREAKFAST AND 1 TABLET WITH DINNER DAILY   nitroGLYCERIN 0.4 MG SL tablet Commonly known as: NITROSTAT Place 1 tablet (0.4 mg total) under the tongue every 5 (five) minutes as needed for chest pain (3 doses maximun). Started by: Willow Ora, MD   Ozempic (0.25 or 0.5 MG/DOSE) 2 MG/1.5ML Sopn Generic drug: Semaglutide(0.25 or 0.5MG /DOS) Inject 0.5 mg into the skin once a week.   rosuvastatin 40 MG tablet Commonly known as: CRESTOR Take 1 tablet (40 mg total) by mouth daily.           Objective:   Physical Exam BP 125/81 (BP Location: Left Arm, Patient Position: Sitting, Cuff Size: Normal)   Pulse 74   Temp (!) 96.7 F (35.9 C) (Temporal)   Resp 16   Ht 6' (1.829 m)   Wt 184 lb 6 oz (83.6 kg)   SpO2 98%   BMI 25.01 kg/m  General:   Well developed, NAD, BMI noted. HEENT:  Normocephalic . Face symmetric, atraumatic Lungs:  CTA B Normal respiratory effort, no intercostal retractions, no accessory muscle use. Heart: RRR,  no murmur.  No pretibial edema bilaterally  Skin: Not pale. Not jaundice Neurologic:  alert & oriented X3.  Speech normal, gait appropriate for age and unassisted Psych--  Cognition and judgment appear intact.  Cooperative with normal attention span and concentration.  Behavior appropriate. No anxious or depressed appearing.      Assessment     Assessment  DM HTN Hyperlipidemia Hypothyroidism DJD Bell's palsy Leukocytosis, benign per Hem, monitoring Abnormal EKG 2009 Tobacco  Abuse  Coronary calcifications, abnormal stress test 01/20/2019.  PLAN: DM: Since the last visit, he switch from Victoza to Ozempic, no ambulatory CBGs, he is also on Metformin.  Check BMP, A1c HTN: On amlodipine, labetalol, Hyzaar.  Seems well controlled.  No change Coronary calcifications: Had a stress test 01/20/2019 with  a small area of mild ischemia, cardiology recommended further evaluation, patient is quite hesitant to proceed and request a second opinion. Referred to another cardiologist in our group.  Continue aspirin.  He let me prescribe nitroglycerin in case  he needs it, how to use it discussed. Hypothyroidism, check a TSH Declined a flu shot, benefits discussed. RTC 4 months

## 2019-05-06 NOTE — Patient Instructions (Addendum)
Per our records you are due for an eye exam. Please contact your eye doctor to schedule an appointment. Please have them send copies of your office visit notes to [place]. Our fax number is [phone].   GO TO THE LAB : Get the blood work     GO TO THE FRONT DESK Schedule your next appointment   for checkup in 4 months  Continue checking your blood pressure BP GOAL is between 110/65 and  135/85. If it is consistently higher or lower, let me know

## 2019-05-07 NOTE — Assessment & Plan Note (Addendum)
DM: Since the last visit, he switch from Victoza to [place], no ambulatory CBGs, he is also on Metformin.  Check BMP, A1c HTN: On amlodipine, labetalol, Hyzaar.  Seems well controlled.  No change Coronary calcifications: Had a stress test 01/20/2019 with a small area of mild ischemia, cardiology recommended further evaluation, patient is quite hesitant to proceed and request a second opinion. Referred to another cardiologist in our group.  Continue aspirin.  He let me prescribe nitroglycerin in case he needs it, how to use it discussed. Hypothyroidism, check a TSH Declined a flu shot, benefits discussed. RTC 4 months

## 2019-05-21 DIAGNOSIS — M9905 Segmental and somatic dysfunction of pelvic region: Secondary | ICD-10-CM | POA: Diagnosis not present

## 2019-05-21 DIAGNOSIS — M9903 Segmental and somatic dysfunction of lumbar region: Secondary | ICD-10-CM | POA: Diagnosis not present

## 2019-05-21 DIAGNOSIS — M9902 Segmental and somatic dysfunction of thoracic region: Secondary | ICD-10-CM | POA: Diagnosis not present

## 2019-05-21 DIAGNOSIS — M5386 Other specified dorsopathies, lumbar region: Secondary | ICD-10-CM | POA: Diagnosis not present

## 2019-06-02 LAB — HM DIABETES EYE EXAM

## 2019-06-30 ENCOUNTER — Other Ambulatory Visit: Payer: Self-pay | Admitting: Internal Medicine

## 2019-07-07 DIAGNOSIS — M9903 Segmental and somatic dysfunction of lumbar region: Secondary | ICD-10-CM | POA: Diagnosis not present

## 2019-07-07 DIAGNOSIS — M9905 Segmental and somatic dysfunction of pelvic region: Secondary | ICD-10-CM | POA: Diagnosis not present

## 2019-07-07 DIAGNOSIS — M9902 Segmental and somatic dysfunction of thoracic region: Secondary | ICD-10-CM | POA: Diagnosis not present

## 2019-07-07 DIAGNOSIS — M5386 Other specified dorsopathies, lumbar region: Secondary | ICD-10-CM | POA: Diagnosis not present

## 2019-08-06 ENCOUNTER — Encounter: Payer: Self-pay | Admitting: Internal Medicine

## 2019-08-18 DIAGNOSIS — M9902 Segmental and somatic dysfunction of thoracic region: Secondary | ICD-10-CM | POA: Diagnosis not present

## 2019-08-18 DIAGNOSIS — M5386 Other specified dorsopathies, lumbar region: Secondary | ICD-10-CM | POA: Diagnosis not present

## 2019-08-18 DIAGNOSIS — M9903 Segmental and somatic dysfunction of lumbar region: Secondary | ICD-10-CM | POA: Diagnosis not present

## 2019-08-18 DIAGNOSIS — M9905 Segmental and somatic dysfunction of pelvic region: Secondary | ICD-10-CM | POA: Diagnosis not present

## 2019-09-02 ENCOUNTER — Other Ambulatory Visit: Payer: Self-pay

## 2019-09-03 ENCOUNTER — Ambulatory Visit: Payer: BC Managed Care – PPO | Admitting: Internal Medicine

## 2019-09-05 ENCOUNTER — Ambulatory Visit: Payer: BC Managed Care – PPO | Admitting: Internal Medicine

## 2019-09-05 ENCOUNTER — Other Ambulatory Visit: Payer: Self-pay

## 2019-09-05 VITALS — BP 108/75 | HR 79 | Temp 96.9°F | Resp 16 | Ht 72.0 in | Wt 172.0 lb

## 2019-09-05 DIAGNOSIS — E039 Hypothyroidism, unspecified: Secondary | ICD-10-CM

## 2019-09-05 DIAGNOSIS — R9439 Abnormal result of other cardiovascular function study: Secondary | ICD-10-CM | POA: Diagnosis not present

## 2019-09-05 DIAGNOSIS — I2510 Atherosclerotic heart disease of native coronary artery without angina pectoris: Secondary | ICD-10-CM

## 2019-09-05 DIAGNOSIS — E1165 Type 2 diabetes mellitus with hyperglycemia: Secondary | ICD-10-CM

## 2019-09-05 DIAGNOSIS — I10 Essential (primary) hypertension: Secondary | ICD-10-CM

## 2019-09-05 LAB — COMPREHENSIVE METABOLIC PANEL
ALT: 40 U/L (ref 0–53)
AST: 31 U/L (ref 0–37)
Albumin: 4.1 g/dL (ref 3.5–5.2)
Alkaline Phosphatase: 47 U/L (ref 39–117)
BUN: 17 mg/dL (ref 6–23)
CO2: 30 mEq/L (ref 19–32)
Calcium: 9.8 mg/dL (ref 8.4–10.5)
Chloride: 98 mEq/L (ref 96–112)
Creatinine, Ser: 1.29 mg/dL (ref 0.40–1.50)
GFR: 57.16 mL/min — ABNORMAL LOW (ref >60.00)
Glucose, Bld: 91 mg/dL (ref 70–99)
Potassium: 3.6 mEq/L (ref 3.5–5.1)
Sodium: 138 mEq/L (ref 135–145)
Total Bilirubin: 0.4 mg/dL (ref 0.2–1.2)
Total Protein: 6.6 g/dL (ref 6.0–8.3)

## 2019-09-05 LAB — TSH: TSH: 2.05 u[IU]/mL (ref 0.35–4.50)

## 2019-09-05 LAB — HEMOGLOBIN A1C: Hgb A1c MFr Bld: 6 % (ref 4.6–6.5)

## 2019-09-05 NOTE — Progress Notes (Signed)
Subjective:    Patient ID: Alex Phillips, male    DOB: [birth year], 59 y.o.   MRN: [id number]  DOS:  09/05/2019 Type of visit - description: Follow-up Since the last office visit he is feeling well. DM: Appetite has decreased, has decreased his portions, + weight loss. We also talk about his coronary calcium and abnormal stress test, Covid vaccination and tobacco abuse.   Wt Readings from Last 3 Encounters:  09/05/19 172 lb (78 kg)  05/06/19 184 lb 6 oz (83.6 kg)  01/28/19 194 lb (88 kg)      Review of Systems No recent chest pain or difficulty breathing  Past Medical History:  Diagnosis Date  . Bell's palsy   . Diabetes mellitus 11/09   A1c- 6.2  . DJD (degenerative joint disease)    l knee  . EKG, abnormal 12-2007   Stress test (-)   . Elevated PSA 2010   saw urology, PSA was reched and found to be normal. Bx was canceled, did get a u/s d/t michrohematuris- normal  . Elevated WBC count    saw Dr.Enenever; likely benign, monitor  . HTN (hypertension) 11/14/2006  . Hyperlipidemia   . Hypertension   . Hypothyroidism     Past Surgical History:  Procedure Laterality Date  . MOUTH SURGERY     crown extention on right upper   . TONSILLECTOMY AND ADENOIDECTOMY    . TYMPANOSTOMY TUBE PLACEMENT     B as a child  . VASECTOMY  02/2009    Allergies as of 09/05/2019      Reactions   Amoxicillin-pot Clavulanate    REACTION: rash/swelling/nausea   Cefaclor    Chantix [varenicline] Other (See Comments)   Aggression per Pt's wife   Januvia [sitagliptin]    Rash? Questionable, see OV 12-18-16      Medication List       Accurate as of September 05, 2019 10:06 AM. If you have any questions, ask your nurse or doctor.        amLODipine 10 MG tablet Commonly known as: NORVASC Take 1 tablet (10 mg total) by mouth daily.   aspirin 81 MG tablet Take 81 mg by mouth daily.   Insulin Pen Needle 32G X 6 MM Misc Commonly known as: BD Pen Needle Micro U/F Use w/ Ozempic     labetalol 300 MG tablet Commonly known as: NORMODYNE Take 1 tablet (300 mg total) by mouth 2 (two) times daily.   levothyroxine 125 MCG tablet Commonly known as: SYNTHROID TAKE 1 TABLET DAILY BEFORE BREAKFAST   losartan-hydrochlorothiazide 100-25 MG tablet Commonly known as: HYZAAR Take 1 tablet by mouth daily.   metFORMIN 850 MG tablet Commonly known as: GLUCOPHAGE TAKE 2 TABLETS WITH BREAKFAST AND 1 TABLET WITH DINNER DAILY   nitroGLYCERIN 0.4 MG SL tablet Commonly known as: NITROSTAT Place 1 tablet (0.4 mg total) under the tongue every 5 (five) minutes as needed for chest pain (3 doses maximun).   Ozempic (0.25 or 0.5 MG/DOSE) 2 MG/1.5ML Sopn Generic drug: Semaglutide(0.25 or 0.5MG /DOS) Inject 0.5 mg into the skin once a week.   rosuvastatin 40 MG tablet Commonly known as: CRESTOR Take 1 tablet (40 mg total) by mouth daily.          Objective:   Physical Exam BP 108/75 (BP Location: Right Arm, Patient Position: Sitting, Cuff Size: Normal)   Pulse 79   Temp (!) 96.9 F (36.1 C) (Temporal)   Resp 16   Ht 6' (1.829 m)  Wt 172 lb (78 kg)   SpO2 96%   BMI 23.33 kg/m  General:   Well developed, NAD, BMI noted.  HEENT:  Normocephalic . Face symmetric, atraumatic Lungs:  CTA B Normal respiratory effort, no intercostal retractions, no accessory muscle use. Heart: RRR,  no murmur.  Abdomen:  Not distended, soft, non-tender. No rebound or rigidity.   Skin: Not pale. Not jaundice Lower extremities: no pretibial edema bilaterally  Neurologic:  alert & oriented X3.  Speech normal, gait appropriate for age and unassisted Psych--  Cognition and judgment appear intact.  Cooperative with normal attention span and concentration.  Behavior appropriate. No anxious or depressed appearing.     Assessment     Assessment  DM HTN Hyperlipidemia Hypothyroidism DJD Bell's palsy Leukocytosis, benign per Hem, monitoring Abnormal EKG 2009 Tobacco  Abuse  Coronary  calcifications, abnormal stress test 01/20/2019.  PLAN: [id number] per last A1c, continue Metformin, Ozempic.  Check A1c HTN: BP slightly on the low side, at home is checked twice a week, no low BPs, never more than 135.  Continue amlodipine, labetalol, Hyzaar.  Check a CMP Hypothyroidism: On Synthroid, check a TSH Weight loss: Likely due to [place].  Observation. Abnormal stress test: Patient was recommended further evaluation, likes a second opinion, we have not been able to arrange that yet, refer to [facility] cardiology.  Currently no chest pain, does have nitroglycerin. Covid vaccine: Discussed, pros >> cons, the patient remains skeptical. Tobacco abuse: Using nicotine gum  to decrease smoking tobacco use, currently half ppd.  Praised RTC 12/2019 CPX   This visit occurred during the SARS-CoV-2 public health emergency.  Safety protocols were in place, including screening questions prior to the visit, additional usage of staff PPE, and extensive cleaning of exam room while observing appropriate contact time as indicated for disinfecting solutions.

## 2019-09-05 NOTE — Patient Instructions (Addendum)
GO TO THE LAB : Get the blood work     GO TO THE FRONT DESK Come back for a physical exam by 12-2019, please make an appointment   Continue checking your blood pressures. BP GOAL is between 110/65 and  135/85. If it is consistently higher or lower, let me know

## 2019-09-06 NOTE — Assessment & Plan Note (Signed)
[id number] per last A1c, continue Metformin, Ozempic.  Check A1c HTN: BP slightly on the low side, at home is checked twice a week, no low BPs, never more than 135.  Continue amlodipine, labetalol, Hyzaar.  Check a CMP Hypothyroidism: On Synthroid, check a TSH Weight loss: Likely due to Ozempic.  Observation. Abnormal stress test: Patient was recommended further evaluation, likes a second opinion, we have not been able to arrange that yet, refer to [facility] cardiology.  Currently no chest pain, does have nitroglycerin. Covid vaccine: Discussed, pros >> cons, the patient remains skeptical. Tobacco abuse: Using nicotine gum  to decrease smoking tobacco use, currently half ppd.  Praised RTC 12/2019 CPX

## 2019-09-24 ENCOUNTER — Other Ambulatory Visit: Payer: Self-pay | Admitting: Internal Medicine

## 2019-09-25 ENCOUNTER — Other Ambulatory Visit: Payer: Self-pay | Admitting: Internal Medicine

## 2019-09-29 DIAGNOSIS — M5386 Other specified dorsopathies, lumbar region: Secondary | ICD-10-CM | POA: Diagnosis not present

## 2019-09-29 DIAGNOSIS — M9905 Segmental and somatic dysfunction of pelvic region: Secondary | ICD-10-CM | POA: Diagnosis not present

## 2019-09-29 DIAGNOSIS — M9903 Segmental and somatic dysfunction of lumbar region: Secondary | ICD-10-CM | POA: Diagnosis not present

## 2019-09-29 DIAGNOSIS — M9902 Segmental and somatic dysfunction of thoracic region: Secondary | ICD-10-CM | POA: Diagnosis not present

## 2019-10-09 ENCOUNTER — Other Ambulatory Visit: Payer: Self-pay | Admitting: Internal Medicine

## 2019-10-09 MED ORDER — LOSARTAN POTASSIUM-HCTZ 100-25 MG PO TABS: 1.0000 | ORAL_TABLET | Freq: Every day | ORAL | 1 refills | Status: DC

## 2019-12-08 ENCOUNTER — Encounter: Payer: Self-pay | Admitting: Internal Medicine

## 2019-12-08 MED ORDER — OZEMPIC (0.25 OR 0.5 MG/DOSE) 2 MG/1.5ML ~~LOC~~ SOPN: 0.5000 mg | PEN_INJECTOR | SUBCUTANEOUS | 1 refills | Status: DC

## 2020-01-06 ENCOUNTER — Encounter: Payer: Self-pay | Admitting: Internal Medicine

## 2020-01-08 ENCOUNTER — Encounter: Payer: Self-pay | Admitting: Internal Medicine

## 2020-01-08 ENCOUNTER — Other Ambulatory Visit: Payer: Self-pay

## 2020-01-08 ENCOUNTER — Ambulatory Visit (INDEPENDENT_AMBULATORY_CARE_PROVIDER_SITE_OTHER): Payer: 59 | Admitting: Internal Medicine

## 2020-01-08 VITALS — BP 118/80 | HR 77 | Temp 97.8°F | Resp 16 | Ht 72.0 in | Wt 162.0 lb

## 2020-01-08 DIAGNOSIS — E1165 Type 2 diabetes mellitus with hyperglycemia: Secondary | ICD-10-CM

## 2020-01-08 DIAGNOSIS — E785 Hyperlipidemia, unspecified: Secondary | ICD-10-CM

## 2020-01-08 DIAGNOSIS — R9439 Abnormal result of other cardiovascular function study: Secondary | ICD-10-CM

## 2020-01-08 DIAGNOSIS — Z0000 Encounter for general adult medical examination without abnormal findings: Secondary | ICD-10-CM | POA: Diagnosis not present

## 2020-01-08 DIAGNOSIS — E039 Hypothyroidism, unspecified: Secondary | ICD-10-CM

## 2020-01-08 DIAGNOSIS — R634 Abnormal weight loss: Secondary | ICD-10-CM

## 2020-01-08 DIAGNOSIS — E1169 Type 2 diabetes mellitus with other specified complication: Secondary | ICD-10-CM | POA: Diagnosis not present

## 2020-01-08 DIAGNOSIS — R7989 Other specified abnormal findings of blood chemistry: Secondary | ICD-10-CM

## 2020-01-08 LAB — LIPID PANEL
Cholesterol: 97 mg/dL (ref 0–200)
HDL: 30.9 mg/dL — ABNORMAL LOW (ref >39.00)
LDL Cholesterol: 37 mg/dL (ref 0–99)
NonHDL: 66.28
Total CHOL/HDL Ratio: 3
Triglycerides: 147 mg/dL (ref 0.0–149.0)
VLDL: 29.4 mg/dL (ref 0.0–40.0)

## 2020-01-08 LAB — COMPREHENSIVE METABOLIC PANEL
ALT: 15 U/L (ref 0–53)
AST: 15 U/L (ref 0–37)
Albumin: 4.5 g/dL (ref 3.5–5.2)
Alkaline Phosphatase: 49 U/L (ref 39–117)
BUN: 21 mg/dL (ref 6–23)
CO2: 31 mEq/L (ref 19–32)
Calcium: 10 mg/dL (ref 8.4–10.5)
Chloride: 96 mEq/L (ref 96–112)
Creatinine, Ser: 1.64 mg/dL — ABNORMAL HIGH (ref 0.40–1.50)
GFR: 43.27 mL/min — ABNORMAL LOW (ref >60.00)
Glucose, Bld: 83 mg/dL (ref 70–99)
Potassium: 3.9 mEq/L (ref 3.5–5.1)
Sodium: 138 mEq/L (ref 135–145)
Total Bilirubin: 0.4 mg/dL (ref 0.2–1.2)
Total Protein: 6.9 g/dL (ref 6.0–8.3)

## 2020-01-08 LAB — HEMOGLOBIN A1C: Hgb A1c MFr Bld: 5.7 % (ref 4.6–6.5)

## 2020-01-08 LAB — TSH: TSH: 0.6 u[IU]/mL (ref 0.35–4.50)

## 2020-01-08 LAB — PSA: PSA: 1.03 ng/mL (ref 0.10–4.00)

## 2020-01-08 NOTE — Progress Notes (Addendum)
Subjective:    Patient ID: Alex Phillips, male    DOB: Feb 11, 1961, 59 y.o.   MRN: [id number]  DOS:  01/08/2020 Type of visit - description: CPX Here for CPX.  Multiple other issues discussed  Weight loss noted: Denies fevers or night sweats. Started Ozempic 11/2018. Stress has decreased since he retired. He admits to upper abdominal fullness if he "overeats".  Interestingly he is unable to vomit when that happens. No actual dysphagia or odynophagia.  No change in the color of the stools.  Wt Readings from Last 3 Encounters:  01/08/20 162 lb (73.5 kg)  09/05/19 172 lb (78 kg)  05/06/19 184 lb 6 oz (83.6 kg)     Review of Systems Occasional "smoker's cough" no hemoptysis. Minimal dizziness sometimes with standing up.  Other than above, a 14 point review of systems is negative    Past Medical History:  Diagnosis Date   Bell's palsy    Diabetes mellitus 11/09   A1c- 6.2   DJD (degenerative joint disease)    l knee   EKG, abnormal 12-2007   Stress test (-)    Elevated PSA 2010   saw urology, PSA was reched and found to be normal. Bx was canceled, did get a u/s d/t michrohematuris- normal   Elevated WBC count    saw Dr.Enenever; likely benign, monitor   HTN (hypertension) 11/14/2006   Hyperlipidemia    Hypertension    Hypothyroidism     Past Surgical History:  Procedure Laterality Date   MOUTH SURGERY     crown extention on right upper    TONSILLECTOMY AND ADENOIDECTOMY     TYMPANOSTOMY TUBE PLACEMENT     B as a child   VASECTOMY  02/2009    Allergies as of 01/08/2020      Reactions   Amoxicillin-pot Clavulanate    REACTION: rash/swelling/nausea   Cefaclor    Chantix [varenicline] Other (See Comments)   Aggression per Pt's wife   Januvia [sitagliptin]    Rash? Questionable, see OV 12-18-16      Medication List       Accurate as of January 08, 2020 11:59 PM. If you have any questions, ask your nurse or doctor.        amLODipine 10 MG  tablet Commonly known as: NORVASC Take 1 tablet (10 mg total) by mouth daily.   aspirin 81 MG tablet Take 81 mg by mouth daily.   Insulin Pen Needle 32G X 6 MM Misc Commonly known as: BD Pen Needle Micro U/F Use w/ Ozempic   labetalol 300 MG tablet Commonly known as: NORMODYNE Take 1 tablet (300 mg total) by mouth 2 (two) times daily.   levothyroxine 125 MCG tablet Commonly known as: SYNTHROID TAKE 1 TABLET DAILY BEFORE BREAKFAST   losartan-hydrochlorothiazide 100-25 MG tablet Commonly known as: HYZAAR Take 1 tablet by mouth daily.   metFORMIN 850 MG tablet Commonly known as: GLUCOPHAGE TAKE 2 TABLETS WITH BREAKFAST AND 1 TABLET WITH DINNER DAILY   nitroGLYCERIN 0.4 MG SL tablet Commonly known as: NITROSTAT Place 1 tablet (0.4 mg total) under the tongue every 5 (five) minutes as needed for chest pain (3 doses maximun).   Ozempic (0.25 or 0.5 MG/DOSE) 2 MG/1.5ML Sopn Generic drug: Semaglutide(0.25 or 0.5MG /DOS) Inject 0.375 mLs (0.5 mg total) into the skin once a week.   rosuvastatin 40 MG tablet Commonly known as: CRESTOR Take 1 tablet (40 mg total) by mouth daily.  Objective:   Physical Exam BP 118/80 (BP Location: Left Arm, Patient Position: Sitting, Cuff Size: Small)    Pulse 77    Temp 97.8 F (36.6 C) (Oral)    Resp 16    Ht 6' (1.829 m)    Wt 162 lb (73.5 kg)    SpO2 98%    BMI 21.97 kg/m  General: Well developed, NAD, BMI noted Neck: No  thyromegaly, no supraclavicular lymphadenopathies HEENT:  Normocephalic . Face symmetric, atraumatic Lungs:  CTA B Normal respiratory effort, no intercostal retractions, no accessory muscle use. Heart: RRR,  no murmur.  Abdomen:  Not distended, soft, non-tender. No rebound or rigidity.   DM foot exam: No edema, good pedal pulses, pinprick examination normal. At the distal left plantar area, question of a superficial splinter without redness or discharge. DRE: Normal prostate Skin: Exposed areas without  rash. Not pale. Not jaundice Neurologic:  alert & oriented X3.  Speech normal, gait appropriate for age and unassisted Strength symmetric and appropriate for age.  Psych: Cognition and judgment appear intact.  Cooperative with normal attention span and concentration.  Behavior appropriate. No anxious or depressed appearing.     Assessment     Assessment  DM HTN Hyperlipidemia Hypothyroidism DJD Bell's palsy Leukocytosis, benign per Hem, monitoring Coronary calcifications, abnormal stress test 01/20/2019.  See cardiology note 01-2019 Tobacco  Abuse   PLAN: Here for CPX DM: Under excellent control, recheck A1c, feet exam done today. HTN: Well-controlled, no change Hypothyroidism: Check a TSH Abnormal stress test: Currently asymptomatic, failed referral to [facility] cardiology, will try again, patient to call in 2-week if he is not contacted. Weight loss: This is the main concern for the patient's family more than the patient himself. Likely related to medications since pronounced weight loss  started after initiation of Ozempic. Also less  stress since he retired.  Interestingly he is having abdominal fullness and "inability to vomit".  Again this could be Ozempic related but he is a heavy smoker and a high risk for cancer, will refer him to GI for consideration of an EGD. ?  Splinter, left foot: Call anytime if the area gets redness or swelling, call for a referral to podiatry if the splinter persist. RTC 3 months  In addition to CPX, I spent 22 minutes discussing other issues including a new one (weight loss)  This visit occurred during the SARS-CoV-2 public health emergency.  Safety protocols were in place, including screening questions prior to the visit, additional usage of staff PPE, and extensive cleaning of exam room while observing appropriate contact time as indicated for disinfecting solutions.

## 2020-01-08 NOTE — Patient Instructions (Addendum)
We are referring you to [facility] cardiology  We are referring you to gastroenterology for consideration for endoscopy    GO TO THE LAB : Get the blood work     GO TO THE FRONT DESK, PLEASE SCHEDULE YOUR APPOINTMENTS Come back for   for a checkup in 3  months

## 2020-01-09 ENCOUNTER — Encounter: Payer: Self-pay | Admitting: Internal Medicine

## 2020-01-10 ENCOUNTER — Encounter: Payer: Self-pay | Admitting: Internal Medicine

## 2020-01-10 NOTE — Assessment & Plan Note (Signed)
Here for CPX DM: Under excellent control, recheck A1c, feet exam done today. HTN: Well-controlled, no change Hypothyroidism: Check a TSH Abnormal stress test: Currently asymptomatic, failed referral to [facility] cardiology, will try again, patient to call in 2-week if he is not contacted. Weight loss: This is the main concern for the patient's family more than the patient himself. Likely related to medications since pronounced weight loss  started after initiation of Ozempic. Also less  stress since he retired.  Interestingly he is having abdominal fullness and "inability to vomit".  Again this could be Ozempic related but he is a heavy smoker and a high risk for cancer, will refer him to GI for consideration of an EGD. ?  Splinter, left foot: Call anytime if the area gets redness or swelling, call for a referral to podiatry if the splinter persist. RTC 3 months

## 2020-01-10 NOTE — Assessment & Plan Note (Addendum)
--  Tdap 2015 -prevnar- 2016 - PNM 23:  2014 - shingrex discussed before - covid vaccines d/w pt , hesitant, pro>>cons d/w pt   - flu shot q year strongly recommended -- CCS 12-2003: Cscope , TICs, hyperplastic polyps  08-2013 Cscope no polyps, 10 years --H/o elevated PSA, had a  urology evaluation 2011; he is asx, DRE today normal, check a PSA. --Diet:  Doing well -Tobacco abuse: Has decreased to only half pack daily not ready to quit, counseled - lung cancer screening: Next CT due 01-2020. -FLP, CMP, CBC with peripheral smear, A1c, TSH, PSA

## 2020-01-12 ENCOUNTER — Encounter: Payer: Self-pay | Admitting: Internal Medicine

## 2020-01-15 NOTE — Addendum Note (Signed)
Addended byConrad Calpine D on: 01/15/2020 08:31 AM   Modules accepted: Orders

## 2020-01-15 NOTE — Progress Notes (Signed)
Order(s) created erroneously. Erroneous order ID: [id number]  Order canceled by: Bobbe Medico  Order cancel date/time: 01/15/2020 2:56 PM

## 2020-01-29 ENCOUNTER — Other Ambulatory Visit: Payer: Self-pay

## 2020-01-29 ENCOUNTER — Other Ambulatory Visit (INDEPENDENT_AMBULATORY_CARE_PROVIDER_SITE_OTHER): Payer: 59

## 2020-01-29 DIAGNOSIS — R7989 Other specified abnormal findings of blood chemistry: Secondary | ICD-10-CM

## 2020-01-29 DIAGNOSIS — R634 Abnormal weight loss: Secondary | ICD-10-CM

## 2020-01-29 LAB — BASIC METABOLIC PANEL
BUN: 13 mg/dL (ref 6–23)
CO2: 31 mEq/L (ref 19–32)
Calcium: 9.7 mg/dL (ref 8.4–10.5)
Chloride: 95 mEq/L — ABNORMAL LOW (ref 96–112)
Creatinine, Ser: 1.36 mg/dL (ref 0.40–1.50)
GFR: 53.7 mL/min — ABNORMAL LOW (ref >60.00)
Glucose, Bld: 98 mg/dL (ref 70–99)
Potassium: 3.5 mEq/L (ref 3.5–5.1)
Sodium: 133 mEq/L — ABNORMAL LOW (ref 135–145)

## 2020-01-29 NOTE — Progress Notes (Signed)
la 

## 2020-01-30 LAB — CBC (INCLUDES DIFF/PLT) WITH PATHOLOGIST REVIEW
Absolute Monocytes: 757 cells/uL (ref 200–950)
Basophils Absolute: 184 cells/uL (ref 0–200)
Basophils Relative: 1.9 %
Eosinophils Absolute: 456 cells/uL (ref 15–500)
Eosinophils Relative: 4.7 %
HCT: 45.3 % (ref 38.5–50.0)
Hemoglobin: 15.4 g/dL (ref 13.2–17.1)
Lymphs Abs: 2260 cells/uL (ref 850–3900)
MCH: 33.1 pg — ABNORMAL HIGH (ref 27.0–33.0)
MCHC: 34 g/dL (ref 32.0–36.0)
MCV: 97.4 fL (ref 80.0–100.0)
MPV: 10.6 fL (ref 7.5–12.5)
Monocytes Relative: 7.8 %
Neutro Abs: 6043 cells/uL (ref 1500–7800)
Neutrophils Relative %: 62.3 %
Platelets: 209 10*3/uL (ref 140–400)
RBC: 4.65 10*6/uL (ref 4.20–5.80)
RDW: 12.8 % (ref 11.0–15.0)
Total Lymphocyte: 23.3 %
WBC: 9.7 10*3/uL (ref 3.8–10.8)

## 2020-02-08 ENCOUNTER — Encounter: Payer: Self-pay | Admitting: Internal Medicine

## 2020-02-09 ENCOUNTER — Other Ambulatory Visit: Payer: Self-pay

## 2020-02-09 DIAGNOSIS — F17200 Nicotine dependence, unspecified, uncomplicated: Secondary | ICD-10-CM

## 2020-02-26 ENCOUNTER — Encounter: Payer: Self-pay | Admitting: Internal Medicine

## 2020-02-26 MED ORDER — LEVOTHYROXINE SODIUM 125 MCG PO TABS: 125.0000 ug | ORAL_TABLET | Freq: Every day | ORAL | 1 refills | Status: DC

## 2020-03-01 ENCOUNTER — Encounter: Payer: Self-pay | Admitting: Internal Medicine

## 2020-03-10 ENCOUNTER — Encounter: Payer: Self-pay | Admitting: Internal Medicine

## 2020-03-10 MED ORDER — METFORMIN HCL 850 MG PO TABS: ORAL_TABLET | ORAL | 1 refills | Status: DC

## 2020-03-12 ENCOUNTER — Other Ambulatory Visit: Payer: Self-pay

## 2020-03-12 ENCOUNTER — Ambulatory Visit (HOSPITAL_BASED_OUTPATIENT_CLINIC_OR_DEPARTMENT_OTHER)
Admission: RE | Admit: 2020-03-12 | Discharge: 2020-03-12 | Disposition: A | Discharge status: Home / Self Care | Payer: 59 | Source: Ambulatory Visit | Attending: Internal Medicine | Admitting: Internal Medicine

## 2020-03-12 DIAGNOSIS — F17200 Nicotine dependence, unspecified, uncomplicated: Secondary | ICD-10-CM | POA: Insufficient documentation

## 2020-03-12 DIAGNOSIS — Z122 Encounter for screening for malignant neoplasm of respiratory organs: Secondary | ICD-10-CM | POA: Insufficient documentation

## 2020-04-09 ENCOUNTER — Other Ambulatory Visit: Payer: Self-pay

## 2020-04-09 ENCOUNTER — Ambulatory Visit: Payer: 59 | Admitting: Internal Medicine

## 2020-04-09 ENCOUNTER — Encounter: Payer: Self-pay | Admitting: Internal Medicine

## 2020-04-09 VITALS — BP 110/77 | HR 71 | Temp 97.5°F | Resp 16 | Ht 72.0 in | Wt 152.5 lb

## 2020-04-09 DIAGNOSIS — Z114 Encounter for screening for human immunodeficiency virus [HIV]: Secondary | ICD-10-CM | POA: Diagnosis not present

## 2020-04-09 DIAGNOSIS — R634 Abnormal weight loss: Secondary | ICD-10-CM | POA: Diagnosis not present

## 2020-04-09 DIAGNOSIS — I10 Essential (primary) hypertension: Secondary | ICD-10-CM

## 2020-04-09 DIAGNOSIS — N19 Unspecified kidney failure: Secondary | ICD-10-CM

## 2020-04-09 DIAGNOSIS — E1165 Type 2 diabetes mellitus with hyperglycemia: Secondary | ICD-10-CM

## 2020-04-09 DIAGNOSIS — Z794 Long term (current) use of insulin: Secondary | ICD-10-CM

## 2020-04-09 DIAGNOSIS — R599 Enlarged lymph nodes, unspecified: Secondary | ICD-10-CM

## 2020-04-09 NOTE — Progress Notes (Signed)
Subjective:    Patient ID: Alex Phillips, male    DOB: Feb 20, 1961, 59 y.o.   MRN: [id number]  DOS:  04/09/2020 Type of visit - description: Routine visit Here for reassessment of chronic issues. Weight loss: Continue to be an issue for the patient.  He said that he is actually feeling well. Reports appetite is okay, p.o. intake is typically okay essentially unchanged.  He also saw cardiology, note reviewed.  DM: On Ozempic and Metformin, no ambulatory CBGs.   Wt Readings from Last 3 Encounters:  04/09/20 152 lb 8 oz (69.2 kg)  01/08/20 162 lb (73.5 kg)  09/05/19 172 lb (78 kg)     Review of Systems Denies fever chills No night sweats No nausea, vomiting, diarrhea or blood in the stools. No postprandial abdominal pain.  From time to time when he "overeats" he has some nausea. Smoker's cough at baseline No hemoptysis No depression No dysuria or gross hematuria  Past Medical History:  Diagnosis Date  . Bell's palsy   . Diabetes mellitus 11/09   A1c- 6.2  . DJD (degenerative joint disease)    l knee  . EKG, abnormal 12-2007   Stress test (-)   . Elevated PSA 2010   saw urology, PSA was reched and found to be normal. Bx was canceled, did get a u/s d/t michrohematuris- normal  . Elevated WBC count    saw Dr.Enenever; likely benign, monitor  . HTN (hypertension) 11/14/2006  . Hyperlipidemia   . Hypertension   . Hypothyroidism     Past Surgical History:  Procedure Laterality Date  . MOUTH SURGERY     crown extention on right upper   . TONSILLECTOMY AND ADENOIDECTOMY    . TYMPANOSTOMY TUBE PLACEMENT     B as a child  . VASECTOMY  02/2009    Allergies as of 04/09/2020      Reactions   Amoxicillin-pot Clavulanate    REACTION: rash/swelling/nausea   Cefaclor    Chantix [varenicline] Other (See Comments)   Aggression per Pt's wife   Januvia [sitagliptin]    Rash? Questionable, see OV 12-18-16      Medication List       Accurate as of April 09, 2020  10:08 AM. If you have any questions, ask your nurse or doctor.        amLODipine 10 MG tablet Commonly known as: NORVASC Take 1 tablet (10 mg total) by mouth daily.   aspirin 81 MG tablet Take 81 mg by mouth daily.   GNP Potassium 99 MG Tabs Generic drug: Potassium Take by mouth.   Insulin Pen Needle 32G X 6 MM Misc Commonly known as: BD Pen Needle Micro U/F Use w/ Ozempic   labetalol 300 MG tablet Commonly known as: NORMODYNE Take 1 tablet (300 mg total) by mouth 2 (two) times daily.   levothyroxine 125 MCG tablet Commonly known as: SYNTHROID Take 1 tablet (125 mcg total) by mouth daily before breakfast.   losartan-hydrochlorothiazide 100-25 MG tablet Commonly known as: HYZAAR Take 1 tablet by mouth daily.   magnesium oxide 400 MG tablet Commonly known as: MAG-OX Take 400 mg by mouth daily.   metFORMIN 850 MG tablet Commonly known as: GLUCOPHAGE Take 2 tablets by mouth daily with breakfast and 1 tablet by mouth daily with dinner   nitroGLYCERIN 0.4 MG SL tablet Commonly known as: NITROSTAT Place 1 tablet (0.4 mg total) under the tongue every 5 (five) minutes as needed for chest pain (3 doses maximun).  Ozempic (0.25 or 0.5 MG/DOSE) 2 MG/1.5ML Sopn Generic drug: Semaglutide(0.25 or 0.5MG /DOS) Inject 0.375 mLs (0.5 mg total) into the skin once a week.   rosuvastatin 40 MG tablet Commonly known as: CRESTOR Take 1 tablet (40 mg total) by mouth daily.          Objective:   Physical Exam BP 110/77 (BP Location: Left Arm, Patient Position: Sitting, Cuff Size: Normal)   Pulse 71   Temp (!) 97.5 F (36.4 C) (Oral)   Resp 16   Ht 6' (1.829 m)   Wt 152 lb 8 oz (69.2 kg)   SpO2 97%   BMI 20.68 kg/m   General:   Well developed, NAD, BMI noted.  HEENT:  Normocephalic . Face symmetric, atraumatic Lymphatic system: No lymphadenopathies at the supraclavicular area or axillary areas. Groins: Several small lymph nodes at the R groin, few on the L, they are  about 1 cm in size, not hard or fixed to deeper structures Lungs:  CTA B Normal respiratory effort, no intercostal retractions, no accessory muscle use. Heart: RRR,  no murmur.  Abdomen:  Not distended, soft, non-tender. No rebound or rigidity.   Skin: Not pale. Not jaundice Lower extremities: no pretibial edema bilaterally  Neurologic:  alert & oriented X3.  Speech normal, gait appropriate for age and unassisted Psych--  Cognition and judgment appear intact.  Cooperative with normal attention span and concentration.  Behavior appropriate. No anxious or depressed appearing.     Assessment       Assessment  DM HTN Hyperlipidemia Hypothyroidism DJD Bell's palsy Leukocytosis, benign per Hem, monitoring Coronary calcifications, abnormal stress test 01/20/2019.  See cardiology note 01-2019 Tobacco  Abuse   PLAN: Weight loss: Ongoing, will stop Ozempic. On chart review, he had CT lung cancer screening 03/11/2020: Negative. Plan: CBC with peripheral smear, TFTs, UA, HIV. Also CT abdomen and pelvis with contrast for further evaluation. Reassess in 1 month. DM, on Metformin, stopping Ozempic, check A1c, CMP Abnormal stress test: Saw cardiology 02/11/2020, they Rx echo, Rx magnesium and rec to d/c tobacco, next visit in 6 months Check magnesium levels. Preventive care: Hesitant to proceed with COVID or influenza shots, pros>> cons extensively discussed. RTC 4 weeks.   This visit occurred during the SARS-CoV-2 public health emergency.  Safety protocols were in place, including screening questions prior to the visit, additional usage of staff PPE, and extensive cleaning of exam room while observing appropriate contact time as indicated for disinfecting solutions.

## 2020-04-09 NOTE — Patient Instructions (Addendum)
Stop Ozempic  Weight yourself every other day bring the log  Will schedule CT of the abdomen and pelvis for you   GO TO THE LAB : Get the blood work     GO TO THE FRONT DESK, PLEASE SCHEDULE YOUR APPOINTMENTS Come back for a checkup in 4 weeks

## 2020-04-09 NOTE — Progress Notes (Signed)
Pre visit review using our clinic review tool, if applicable. No additional management support is needed unless otherwise documented below in the visit note. 

## 2020-04-10 ENCOUNTER — Telehealth: Payer: Self-pay | Admitting: Internal Medicine

## 2020-04-10 LAB — URINALYSIS, ROUTINE W REFLEX MICROSCOPIC
Bacteria, UA: NONE SEEN /HPF
Bilirubin Urine: NEGATIVE
Glucose, UA: NEGATIVE
Hgb urine dipstick: NEGATIVE
Leukocytes,Ua: NEGATIVE
Nitrite: NEGATIVE
Specific Gravity, Urine: 1.021 (ref 1.001–1.03)
pH: 6 (ref 5.0–8.0)

## 2020-04-10 NOTE — Telephone Encounter (Signed)
Creatinine elevated, calcium and WBCs slightly elevated A1c 4.8, UA with some casts, HIV pending. Plan: Stop Metformin, hold losartan HCT Arrange for a BMP 04/15/2020, DX renal failure Drink plenty of fluids Change CT to abdominal and pelvis to without contrast No NSAIDs (patient is really not taking them). All of the above discussed with the patient. Consider prompt renal referral

## 2020-04-10 NOTE — Assessment & Plan Note (Signed)
Weight loss: Ongoing, will stop Ozempic. On chart review, he had CT lung cancer screening 03/11/2020: Negative. Plan: CBC with peripheral smear, TFTs, UA, HIV. Also CT abdomen and pelvis with contrast for further evaluation. Reassess in 1 month. DM, on Metformin, stopping Ozempic, check A1c, CMP Abnormal stress test: Saw cardiology 02/11/2020, they Rx echo, Rx magnesium and rec to d/c tobacco, next visit in 6 months Check magnesium levels. Preventive care: Hesitant to proceed with COVID or influenza shots, pros>> cons extensively discussed. RTC 4 weeks.

## 2020-04-12 LAB — CBC (INCLUDES DIFF/PLT) WITH PATHOLOGIST REVIEW
Absolute Monocytes: 882 cells/uL (ref 200–950)
Basophils Absolute: 186 cells/uL (ref 0–200)
Basophils Relative: 1.6 %
Eosinophils Absolute: 336 cells/uL (ref 15–500)
Eosinophils Relative: 2.9 %
HCT: 44.3 % (ref 38.5–50.0)
Hemoglobin: 15.6 g/dL (ref 13.2–17.1)
Lymphs Abs: 2424 cells/uL (ref 850–3900)
MCH: 33.4 pg — ABNORMAL HIGH (ref 27.0–33.0)
MCHC: 35.2 g/dL (ref 32.0–36.0)
MCV: 94.9 fL (ref 80.0–100.0)
MPV: 10.2 fL (ref 7.5–12.5)
Monocytes Relative: 7.6 %
Neutro Abs: 7772 cells/uL (ref 1500–7800)
Neutrophils Relative %: 67 %
Platelets: 195 10*3/uL (ref 140–400)
RBC: 4.67 10*6/uL (ref 4.20–5.80)
RDW: 12.9 % (ref 11.0–15.0)
Total Lymphocyte: 20.9 %
WBC: 11.6 10*3/uL — ABNORMAL HIGH (ref 3.8–10.8)

## 2020-04-12 LAB — COMPREHENSIVE METABOLIC PANEL
AG Ratio: 1.6 (calc) (ref 1.0–2.5)
ALT: 25 U/L (ref 9–46)
AST: 15 U/L (ref 10–35)
Albumin: 4.3 g/dL (ref 3.6–5.1)
Alkaline phosphatase (APISO): 51 U/L (ref 35–144)
BUN/Creatinine Ratio: 12 (calc) (ref 6–22)
BUN: 27 mg/dL — ABNORMAL HIGH (ref 7–25)
CO2: 26 mmol/L (ref 20–32)
Calcium: 10.4 mg/dL — ABNORMAL HIGH (ref 8.6–10.3)
Chloride: 100 mmol/L (ref 98–110)
Creat: 2.25 mg/dL — ABNORMAL HIGH (ref 0.70–1.33)
Globulin: 2.7 g/dL (calc) (ref 1.9–3.7)
Glucose, Bld: 88 mg/dL (ref 65–99)
Potassium: 3.8 mmol/L (ref 3.5–5.3)
Sodium: 140 mmol/L (ref 135–146)
Total Bilirubin: 0.3 mg/dL (ref 0.2–1.2)
Total Protein: 7 g/dL (ref 6.1–8.1)

## 2020-04-12 LAB — HEMOGLOBIN A1C
Hgb A1c MFr Bld: 4.8 % of total Hgb (ref <5.7)
Mean Plasma Glucose: 91 (calc)
eAG (mmol/L): 5 (calc)

## 2020-04-12 LAB — TSH: TSH: 1.03 mIU/L (ref 0.40–4.50)

## 2020-04-12 LAB — HIV ANTIBODY (ROUTINE TESTING W REFLEX): HIV 1&2 Ab, 4th Generation: NONREACTIVE

## 2020-04-12 LAB — T4, FREE: Free T4: 1.7 ng/dL (ref 0.8–1.8)

## 2020-04-12 LAB — T3, FREE: T3, Free: 2.6 pg/mL (ref 2.3–4.2)

## 2020-04-12 LAB — MAGNESIUM: Magnesium: 2.5 mg/dL (ref 1.5–2.5)

## 2020-04-12 NOTE — Telephone Encounter (Signed)
Spoke w/ Pt- lab appt scheduled for 04/15/2020.

## 2020-04-12 NOTE — Telephone Encounter (Signed)
CT changed to w/o contrast. Metformin d/c. BMP ordered.

## 2020-04-12 NOTE — Addendum Note (Signed)
Addended byConrad Comal D on: 04/12/2020 07:29 AM   Modules accepted: Orders

## 2020-04-15 ENCOUNTER — Other Ambulatory Visit: Payer: Self-pay

## 2020-04-15 ENCOUNTER — Other Ambulatory Visit (INDEPENDENT_AMBULATORY_CARE_PROVIDER_SITE_OTHER): Payer: 59

## 2020-04-15 DIAGNOSIS — N19 Unspecified kidney failure: Secondary | ICD-10-CM

## 2020-04-15 LAB — BASIC METABOLIC PANEL
BUN/Creatinine Ratio: 11 (calc) (ref 6–22)
BUN: 23 mg/dL (ref 7–25)
CO2: 26 mmol/L (ref 20–32)
Calcium: 9 mg/dL (ref 8.6–10.3)
Chloride: 107 mmol/L (ref 98–110)
Creat: 2.11 mg/dL — ABNORMAL HIGH (ref 0.70–1.33)
Glucose, Bld: 117 mg/dL — ABNORMAL HIGH (ref 65–99)
Potassium: 3.5 mmol/L (ref 3.5–5.3)
Sodium: 139 mmol/L (ref 135–146)

## 2020-04-16 ENCOUNTER — Telehealth: Payer: Self-pay | Admitting: Internal Medicine

## 2020-04-16 DIAGNOSIS — R7989 Other specified abnormal findings of blood chemistry: Secondary | ICD-10-CM

## 2020-04-16 NOTE — Telephone Encounter (Signed)
Spoke w/ Pt- informed of results and recommendations. Pt verbalized understanding. CT approved by insurance yesterday, hopefully radiology will be calling to schedule soon.

## 2020-04-16 NOTE — Telephone Encounter (Signed)
Advise patient: Continue holding losartan HCT, monitor BPs, if they are more than 140 let me know Proceed with a noncontrast CT of the abdomen Arrange a nephrology referral DX increased creatinine

## 2020-04-21 ENCOUNTER — Other Ambulatory Visit: Payer: Self-pay

## 2020-04-21 ENCOUNTER — Ambulatory Visit (HOSPITAL_BASED_OUTPATIENT_CLINIC_OR_DEPARTMENT_OTHER)
Admission: RE | Admit: 2020-04-21 | Discharge: 2020-04-21 | Disposition: A | Discharge status: Home / Self Care | Payer: 59 | Source: Ambulatory Visit | Attending: Internal Medicine | Admitting: Internal Medicine

## 2020-04-21 DIAGNOSIS — R599 Enlarged lymph nodes, unspecified: Secondary | ICD-10-CM | POA: Insufficient documentation

## 2020-04-21 DIAGNOSIS — R634 Abnormal weight loss: Secondary | ICD-10-CM | POA: Diagnosis not present

## 2020-04-23 ENCOUNTER — Other Ambulatory Visit: Payer: Self-pay

## 2020-04-23 ENCOUNTER — Telehealth: Payer: Self-pay | Admitting: Internal Medicine

## 2020-04-23 NOTE — Telephone Encounter (Signed)
Spoke w/ Pt- he did receive a call from [facility]- has not been able to call them back yet. Instructed to stop amlodipine and return their call at his convenience. Pt verbalized understanding.

## 2020-04-23 NOTE — Telephone Encounter (Signed)
Received a note from nephrology, patient to stop amlodipine. Please let the patient know.

## 2020-04-28 ENCOUNTER — Other Ambulatory Visit: Payer: Self-pay | Admitting: Internal Medicine

## 2020-05-10 ENCOUNTER — Other Ambulatory Visit: Payer: Self-pay

## 2020-05-10 ENCOUNTER — Encounter: Payer: Self-pay | Admitting: Internal Medicine

## 2020-05-10 ENCOUNTER — Ambulatory Visit: Payer: 59 | Admitting: Internal Medicine

## 2020-05-10 VITALS — BP 152/108 | HR 62 | Temp 97.8°F | Resp 16 | Ht 72.0 in | Wt 170.4 lb

## 2020-05-10 DIAGNOSIS — I10 Essential (primary) hypertension: Secondary | ICD-10-CM

## 2020-05-10 DIAGNOSIS — R634 Abnormal weight loss: Secondary | ICD-10-CM

## 2020-05-10 DIAGNOSIS — Z794 Long term (current) use of insulin: Secondary | ICD-10-CM

## 2020-05-10 DIAGNOSIS — E1165 Type 2 diabetes mellitus with hyperglycemia: Secondary | ICD-10-CM | POA: Diagnosis not present

## 2020-05-10 LAB — POCT GLUCOSE (DEVICE FOR HOME USE): POC Glucose: 127 mg/dl — AB (ref 70–99)

## 2020-05-10 NOTE — Patient Instructions (Addendum)
Take medications as recommended until you see nephrology  Check the  blood pressure daily if possible BP GOAL is between 110/65 and  135/85. If it is consistently higher or lower, let me know  Check your blood sugars 3-4 times a week  Watch your diet closely GO TO THE FRONT DESK, PLEASE SCHEDULE YOUR APPOINTMENTS Come back for a checkup in 3 months

## 2020-05-10 NOTE — Progress Notes (Signed)
Pre visit review using our clinic review tool, if applicable. No additional management support is needed unless otherwise documented below in the visit note. 

## 2020-05-10 NOTE — Progress Notes (Signed)
Subjective:    Patient ID: Alex Phillips, male    DOB: 07-28-1960, 59 y.o.   MRN: [id number]  DOS:  05/10/2020 Type of visit - description: f/u Several issues discussed: Weight loss: Since the last visit, stopped Ozempic, appetite came back, he is gaining weight.  Feels well. DM: No ambulatory CBGs HTN: BP elevated today, no ambulatory BPs.    Wt Readings from Last 3 Encounters:  05/10/20 170 lb 6 oz (77.3 kg)  04/09/20 152 lb 8 oz (69.2 kg)  01/08/20 162 lb (73.5 kg)     Review of Systems Denies any particular symptoms or problems at this point Past Medical History:  Diagnosis Date  . Bell's palsy   . Diabetes mellitus 11/09   A1c- 6.2  . DJD (degenerative joint disease)    l knee  . EKG, abnormal 12-2007   Stress test (-)   . Elevated PSA 2010   saw urology, PSA was reched and found to be normal. Bx was canceled, did get a u/s d/t michrohematuris- normal  . Elevated WBC count    saw Dr.Enenever; likely benign, monitor  . HTN (hypertension) 11/14/2006  . Hyperlipidemia   . Hypertension   . Hypothyroidism     Past Surgical History:  Procedure Laterality Date  . MOUTH SURGERY     crown extention on right upper   . TONSILLECTOMY AND ADENOIDECTOMY    . TYMPANOSTOMY TUBE PLACEMENT     B as a child  . VASECTOMY  02/2009    Allergies as of 05/10/2020      Reactions   Amoxicillin-pot Clavulanate    REACTION: rash/swelling/nausea   Cefaclor    Chantix [varenicline] Other (See Comments)   Aggression per Pt's wife   Januvia [sitagliptin]    Rash? Questionable, see OV 12-18-16      Medication List       Accurate as of May 10, 2020 11:59 PM. If you have any questions, ask your nurse or doctor.        STOP taking these medications   GNP Potassium 99 MG Tabs Generic drug: Potassium Stopped by: Willow Ora, MD   Insulin Pen Needle 32G X 6 MM Misc Commonly known as: BD Pen Needle Micro U/F Stopped by: Willow Ora, MD   losartan-hydrochlorothiazide 100-25  MG tablet Commonly known as: HYZAAR Stopped by: Willow Ora, MD     TAKE these medications   aspirin 81 MG tablet Take 81 mg by mouth daily.   labetalol 300 MG tablet Commonly known as: NORMODYNE 1 tablet in the morning, half tablet with lunch and 1 tablet at night What changed:   how much to take  how to take this  when to take this  additional instructions Changed by: Willow Ora, MD   levothyroxine 125 MCG tablet Commonly known as: SYNTHROID Take 1 tablet (125 mcg total) by mouth daily before breakfast.   magnesium oxide 400 MG tablet Commonly known as: MAG-OX Take 400 mg by mouth daily.   nitroGLYCERIN 0.4 MG SL tablet Commonly known as: NITROSTAT Place 1 tablet (0.4 mg total) under the tongue every 5 (five) minutes as needed for chest pain (3 doses maximun).   rosuvastatin 40 MG tablet Commonly known as: CRESTOR Take 1 tablet (40 mg total) by mouth daily.          Objective:   Physical Exam BP (!) 152/108 (BP Location: Left Arm)   Pulse 62   Temp 97.8 F (36.6 C) (Oral)   Resp  16   Ht 6' (1.829 m)   Wt 170 lb 6 oz (77.3 kg)   SpO2 98%   BMI 23.11 kg/m      General:   Well developed, NAD, BMI noted. HEENT:  Normocephalic . Face symmetric, atraumatic Lungs:  CTA B Normal respiratory effort, no intercostal retractions, no accessory muscle use. Heart: RRR,  no murmur.  Lower extremities: no pretibial edema bilaterally  Skin: Not pale. Not jaundice Neurologic:  alert & oriented X3.  Speech normal, gait appropriate for age and unassisted Psych--  Cognition and judgment appear intact.  Cooperative with normal attention span and concentration.  Behavior appropriate. No anxious or depressed appearing.   Assessment     Assessment  DM HTN Hyperlipidemia Hypothyroidism DJD Bell's palsy Leukocytosis, benign per Hem, monitoring Coronary calcifications, abnormal stress test 01/20/2019.  See cardiology note 01-2019 Tobacco  Abuse   PLAN: Weight  loss:  See last visit, blood work done, creatinine was elevated (referred to nephrology) other labs okay, CT w/o  abdomen and pelvis :negative. Weight loss has stopped since he discontinue Ozempic, appetite increased. DM: Due to weight loss, Ozempic was to stopped last month, and subsequently Metformin DC'd due to increased creatinine.  A1c was 4.8.  Currently on diet control, today 2 hours postprandial CBGs 127 HTN: Since the last visit, Hyzaar discontinued due to increased creatinine, amlodipine discontinue as requested by nephrology, currently on labetalol 300 mg twice daily. BP upon arrival elevated, recheck: 152/108, he is asx Plan: See nephrology in 2 days as planned until then iIncrease labetalol to 300-150-300 mg, stop potassium supplements (he takes them irregularly) RTC  3 months        This visit occurred during the SARS-CoV-2 public health emergency.  Safety protocols were in place, including screening questions prior to the visit, additional usage of staff PPE, and extensive cleaning of exam room while observing appropriate contact time as indicated for disinfecting solutions.

## 2020-05-11 NOTE — Assessment & Plan Note (Signed)
Weight loss:  See last visit, blood work done, creatinine was elevated (referred to nephrology) other labs okay, CT w/o  abdomen and pelvis :negative. Weight loss has stopped since he discontinue Ozempic, appetite increased. DM: Due to weight loss, Ozempic was to stopped last month, and subsequently Metformin DC'd due to increased creatinine.  A1c was 4.8.  Currently on diet control, today 2 hours postprandial CBGs 127 HTN: Since the last visit, Hyzaar discontinued due to increased creatinine, amlodipine discontinue as requested by nephrology, currently on labetalol 300 mg twice daily. BP upon arrival elevated, recheck: 152/108, he is asx Plan: See nephrology in 2 days as planned until then iIncrease labetalol to 300-150-300 mg, stop potassium supplements (he takes them irregularly) RTC  3 months

## 2020-05-12 LAB — COMPREHENSIVE METABOLIC PANEL
Albumin: 4.2 (ref 3.5–5.0)
Calcium: 9.5 (ref 8.7–10.7)
GFR calc Af Amer: 59
GFR calc non Af Amer: 51

## 2020-05-12 LAB — BASIC METABOLIC PANEL
BUN: 17 (ref 4–21)
CO2: 27 — AB (ref 13–22)
Chloride: 104 (ref 99–108)
Creatinine: 1.5 — AB (ref 0.6–1.3)
Glucose: 103
Potassium: 4.1 (ref 3.4–5.3)
Sodium: 141 (ref 137–147)

## 2020-05-12 LAB — CBC: RBC: 4.32 (ref 3.87–5.11)

## 2020-05-12 LAB — CBC AND DIFFERENTIAL
HCT: 42 (ref 41–53)
Hemoglobin: 14.4 (ref 13.5–17.5)
Neutrophils Absolute: 7.6
Platelets: 187 (ref 150–399)
WBC: 11.9

## 2020-06-08 ENCOUNTER — Encounter: Payer: Self-pay | Admitting: Internal Medicine

## 2020-06-15 LAB — BASIC METABOLIC PANEL
BUN: 17 (ref 4–21)
CO2: 27 — AB (ref 13–22)
Chloride: 105 (ref 99–108)
Creatinine: 1.5 — AB (ref 0.6–1.3)
Glucose: 143
Potassium: 4.8 (ref 3.4–5.3)
Sodium: 141 (ref 137–147)

## 2020-06-15 LAB — COMPREHENSIVE METABOLIC PANEL
Albumin: 4.6 (ref 3.5–5.0)
Calcium: 9.6 (ref 8.7–10.7)
GFR calc Af Amer: 56
GFR calc non Af Amer: 49

## 2020-07-05 ENCOUNTER — Encounter: Payer: Self-pay | Admitting: Internal Medicine

## 2020-07-19 ENCOUNTER — Encounter: Payer: Self-pay | Admitting: Internal Medicine

## 2020-07-19 MED ORDER — ROSUVASTATIN CALCIUM 40 MG PO TABS: 40.0000 mg | ORAL_TABLET | Freq: Every day | ORAL | 1 refills | Status: DC

## 2020-08-10 ENCOUNTER — Ambulatory Visit (INDEPENDENT_AMBULATORY_CARE_PROVIDER_SITE_OTHER): Payer: 59 | Admitting: Internal Medicine

## 2020-08-10 ENCOUNTER — Other Ambulatory Visit: Payer: Self-pay

## 2020-08-10 ENCOUNTER — Encounter: Payer: Self-pay | Admitting: Internal Medicine

## 2020-08-10 VITALS — BP 146/92 | HR 76 | Temp 97.7°F | Resp 18 | Ht 72.0 in | Wt 186.5 lb

## 2020-08-10 DIAGNOSIS — M25511 Pain in right shoulder: Secondary | ICD-10-CM

## 2020-08-10 DIAGNOSIS — I10 Essential (primary) hypertension: Secondary | ICD-10-CM | POA: Diagnosis not present

## 2020-08-10 DIAGNOSIS — N1831 Chronic kidney disease, stage 3a: Secondary | ICD-10-CM | POA: Diagnosis not present

## 2020-08-10 DIAGNOSIS — Z794 Long term (current) use of insulin: Secondary | ICD-10-CM | POA: Diagnosis not present

## 2020-08-10 DIAGNOSIS — E1165 Type 2 diabetes mellitus with hyperglycemia: Secondary | ICD-10-CM | POA: Diagnosis not present

## 2020-08-10 LAB — BASIC METABOLIC PANEL
BUN: 20 mg/dL (ref 6–23)
CO2: 30 mEq/L (ref 19–32)
Calcium: 9.5 mg/dL (ref 8.4–10.5)
Chloride: 103 mEq/L (ref 96–112)
Creatinine, Ser: 1.67 mg/dL — ABNORMAL HIGH (ref 0.40–1.50)
GFR: 44.62 mL/min — ABNORMAL LOW (ref >60.00)
Glucose, Bld: 114 mg/dL — ABNORMAL HIGH (ref 70–99)
Potassium: 4.2 mEq/L (ref 3.5–5.1)
Sodium: 140 mEq/L (ref 135–145)

## 2020-08-10 LAB — MAGNESIUM: Magnesium: 2.1 mg/dL (ref 1.5–2.5)

## 2020-08-10 LAB — HEMOGLOBIN A1C: Hgb A1c MFr Bld: 7.2 % — ABNORMAL HIGH (ref 4.6–6.5)

## 2020-08-10 NOTE — Progress Notes (Signed)
Subjective:    Patient ID: Alex Phillips, male    DOB: 08-19-1960, 60 y.o.   MRN: [id number]  DOS:  08/10/2020 Type of visit - description: Follow-up Since the last office visit is doing well. BPs at home is slightly elevated. No recent ambulatory CBGs. Also complaining of a chronic right shoulder pain, denies neck pain.   Wt Readings from Last 3 Encounters:  08/10/20 186 lb 8 oz (84.6 kg)  05/10/20 170 lb 6 oz (77.3 kg)  04/09/20 152 lb 8 oz (69.2 kg)     Review of Systems See above   Past Medical History:  Diagnosis Date  . Bell's palsy   . Diabetes mellitus 11/09   A1c- 6.2  . DJD (degenerative joint disease)    l knee  . EKG, abnormal 12-2007   Stress test (-)   . Elevated PSA 2010   saw urology, PSA was reched and found to be normal. Bx was canceled, did get a u/s d/t michrohematuris- normal  . Elevated WBC count    saw Dr.Enenever; likely benign, monitor  . HTN (hypertension) 11/14/2006  . Hyperlipidemia   . Hypertension   . Hypothyroidism     Past Surgical History:  Procedure Laterality Date  . MOUTH SURGERY     crown extention on right upper   . TONSILLECTOMY AND ADENOIDECTOMY    . TYMPANOSTOMY TUBE PLACEMENT     B as a child  . VASECTOMY  02/2009    Allergies as of 08/10/2020      Reactions   Amoxicillin-pot Clavulanate    REACTION: rash/swelling/nausea   Cefaclor    Chantix [varenicline] Other (See Comments)   Aggression per Pt's wife   Januvia [sitagliptin]    Rash? Questionable, see OV 12-18-16      Medication List       Accurate as of August 10, 2020 11:24 AM. If you have any questions, ask your nurse or doctor.        aspirin 81 MG tablet Take 81 mg by mouth daily.   labetalol 300 MG tablet Commonly known as: NORMODYNE 1 tablet in the morning, half tablet with lunch and 1 tablet at night   levothyroxine 125 MCG tablet Commonly known as: SYNTHROID Take 1 tablet (125 mcg total) by mouth daily before breakfast.   magnesium  oxide 400 MG tablet Commonly known as: MAG-OX Take 400 mg by mouth daily.   nitroGLYCERIN 0.4 MG SL tablet Commonly known as: NITROSTAT Place 1 tablet (0.4 mg total) under the tongue every 5 (five) minutes as needed for chest pain (3 doses maximun).   rosuvastatin 40 MG tablet Commonly known as: CRESTOR Take 1 tablet (40 mg total) by mouth daily.          Objective:   Physical Exam BP (!) 146/92 (BP Location: Left Arm, Patient Position: Sitting, Cuff Size: Normal)   Pulse 76   Temp 97.7 F (36.5 C) (Oral)   Resp 18   Ht 6' (1.829 m)   Wt 186 lb 8 oz (84.6 kg)   SpO2 96%   BMI 25.29 kg/m  General:   Well developed, NAD, BMI noted. HEENT:  Normocephalic . Face symmetric, atraumatic Lungs:  CTA B Normal respiratory effort, no intercostal retractions, no accessory muscle use. Heart: RRR,  no murmur.  Lower extremities: no pretibial edema bilaterally  Skin: Not pale. Not jaundice MSK: Shoulders: Symmetric, left shoulder range of motion normal Right shoulder: Range of motion limited, unable to elevate the arm  completely. Neurologic:  alert & oriented X3.  Speech normal, gait appropriate for age and unassisted Psych--  Cognition and judgment appear intact.  Cooperative with normal attention span and concentration.  Behavior appropriate. No anxious or depressed appearing.       Assessment      Assessment  DM HTN Hyperlipidemia Hypothyroidism DJD Bell's palsy Leukocytosis, benign per Hem, monitoring Coronary calcifications, abnormal stress test 01/20/2019.  See cardiology note 01-2019 Tobacco  Abuse   PLAN: CKD, proteinuria, hypertension last visit with nephrology 06/15/2020: At that time creatinine was 1.4, potassium 4.8. They suspected longstanding CKD due to DM, HTN. They recommended a strict risk factor modification. They rec cont amlodipine, switch to  losartan for renal protection at some point , eventually they would like to start him on Farxiga. BP  goal 130/80. Was recommended to switch labetalol to carvedilol. Work-up for proteinuria unremarkable. At this point, he is only taking normodyne and  amlodipine 5 mg, ambulatory BPs slightly elevated, declining me to adjust BP meds, will d/w  nephrologist soon.  Checking a BMP DM: Diet controlled, weight increased since he stopped Ozempic few months ago, no recent ambulatory CBGs, check A1c. Right shoulder pain: Chronic, referred to Ortho, recommend to avoid "BCs", recommend Tylenol instead Weight loss: Resolved Preventive care: Remains skeptical about proceed with a COVID vaccine. RTC 3 months  This visit occurred during the SARS-CoV-2 public health emergency.  Safety protocols were in place, including screening questions prior to the visit, additional usage of staff PPE, and extensive cleaning of exam room while observing appropriate contact time as indicated for disinfecting solutions.

## 2020-08-10 NOTE — Progress Notes (Signed)
Pre visit review using our clinic review tool, if applicable. No additional management support is needed unless otherwise documented below in the visit note. 

## 2020-08-10 NOTE — Patient Instructions (Addendum)
Glucometers: One Touch, Freestyle (however it depends on which brand your insurance covers)  Per our records you are due for an eye exam. Please contact your eye doctor to schedule an appointment. Please have them send copies of your office visit notes to [place]. Our fax number is [phone].  Continue checking your blood pressures at home, please contact nephrology with your readings next week.  Your blood pressure goal is 130/80.   GO TO THE LAB : Get the blood work     GO TO THE FRONT DESK, PLEASE SCHEDULE YOUR APPOINTMENTS Come back for a checkup in 3 months    Advance Directive  Advance directives are legal documents that allow you to make decisions about your health care and medical treatment in case you become unable to communicate for yourself. Advance directives let your wishes be known to family, friends, and health care providers. Discussing and writing advance directives should happen over time rather than all at once. Advance directives can be changed and updated at any time. There are different types of advance directives, such as:  Medical power of attorney.  Living will.  Do not resuscitate (DNR) order or do not attempt resuscitation (DNAR) order. Health care proxy and medical power of attorney A health care proxy is also called a health care agent. This person is appointed to make medical decisions for you when you are unable to make decisions for yourself. Generally, people ask a trusted friend or family member to act as their proxy and represent their preferences. Make sure you have an agreement with your trusted person to act as your proxy. A proxy may have to make a medical decision on your behalf if your wishes are not known. A medical power of attorney, also called a durable power of attorney for health care, is a legal document that names your health care proxy. Depending on the laws in your state, the document may need to  be:  Signed.  Notarized.  Dated.  Copied.  Witnessed.  Incorporated into your medical record. You may also want to appoint a trusted person to manage your money in the event you are unable to do so. This is called a durable power of attorney for finances. It is a separate legal document from the durable power of attorney for health care. You may choose your health care proxy or someone different to act as your agent in money matters. If you do not appoint a proxy, or there is a concern that the proxy is not acting in your best interest, a court may appoint a guardian to act on your behalf. Living will A living will is a set of instructions that state your wishes about medical care when you cannot express them yourself. Health care providers should keep a copy of your living will in your medical record. You may want to give a copy to family members or friends. To alert caregivers in case of an emergency, you can place a card in your wallet to let them know that you have a living will and where they can find it. A living will is used if you become:  Terminally ill.  Disabled.  Unable to communicate or make decisions. The following decisions should be included in your living will:  To use or not to use life support equipment, such as dialysis machines and breathing machines (ventilators).  Whether you want a DNR or DNAR order. This tells health care providers not to use cardiopulmonary resuscitation (CPR) if  breathing or heartbeat stops.  To use or not to use tube feeding.  To be given or not to be given food and fluids.  Whether you want comfort (palliative) care when the goal becomes comfort rather than a cure.  Whether you want to donate your organs and tissues. A living will does not give instructions for distributing your money and property if you should pass away. DNR or DNAR A DNR or DNAR order is a request not to have CPR in the event that your heart stops beating or you  stop breathing. If a DNR or DNAR order has not been made and shared, a health care provider will try to help any patient whose heart has stopped or who has stopped breathing. If you plan to have surgery, talk with your health care provider about how your DNR or DNAR order will be followed if problems occur. What if I do not have an advance directive? Some states assign family decision makers to act on your behalf if you do not have an advance directive. Each state has its own laws about advance directives. You may want to check with your health care provider, attorney, or state representative about the laws in your state. Summary  Advance directives are legal documents that allow you to make decisions about your health care and medical treatment in case you become unable to communicate for yourself.  The process of discussing and writing advance directives should happen over time. You can change and update advance directives at any time.  Advance directives may include a medical power of attorney, a living will, and a DNR or DNAR order. This information is not intended to replace advice given to you by your health care provider. Make sure you discuss any questions you have with your health care provider. Document Revised: 03/16/2020 Document Reviewed: 03/16/2020 Elsevier Patient Education  2021 [employer].

## 2020-08-11 DIAGNOSIS — N183 Chronic kidney disease, stage 3 unspecified: Secondary | ICD-10-CM | POA: Insufficient documentation

## 2020-08-11 NOTE — Assessment & Plan Note (Signed)
CKD, proteinuria, hypertension last visit with nephrology 06/15/2020: At that time creatinine was 1.4, potassium 4.8. They suspected longstanding CKD due to DM, HTN. They recommended a strict risk factor modification. They rec cont amlodipine, switch to  losartan for renal protection at some point , eventually they would like to start him on Farxiga. BP goal 130/80. Was recommended to switch labetalol to carvedilol. Work-up for proteinuria unremarkable. At this point, he is only taking normodyne and  amlodipine 5 mg, ambulatory BPs slightly elevated, declining me to adjust BP meds, will d/w  nephrologist soon.  Checking a BMP DM: Diet controlled, weight increased since he stopped Ozempic few months ago, no recent ambulatory CBGs, check A1c. Right shoulder pain: Chronic, referred to Ortho, recommend to avoid "BCs", recommend Tylenol instead Weight loss: Resolved Preventive care: Remains skeptical about proceed with a COVID vaccine. RTC 3 months

## 2020-08-12 MED ORDER — DAPAGLIFLOZIN PROPANEDIOL 5 MG PO TABS: 5.0000 mg | ORAL_TABLET | Freq: Every day | ORAL | 0 refills | Status: DC

## 2020-08-12 NOTE — Addendum Note (Signed)
Addended byConrad [place] D on: 08/12/2020 08:41 AM   Modules accepted: Orders

## 2020-09-11 ENCOUNTER — Other Ambulatory Visit: Payer: Self-pay | Admitting: Internal Medicine

## 2020-09-13 MED ORDER — LABETALOL HCL 300 MG PO TABS: ORAL_TABLET | ORAL | 1 refills | Status: DC

## 2020-09-21 ENCOUNTER — Other Ambulatory Visit: Payer: Self-pay | Admitting: Internal Medicine

## 2020-10-18 LAB — COMPREHENSIVE METABOLIC PANEL
Albumin: 4.7 (ref 3.5–5.0)
Calcium: 9.3 (ref 8.7–10.7)
GFR calc non Af Amer: 42

## 2020-10-18 LAB — CBC AND DIFFERENTIAL
HCT: 49 (ref 41–53)
Hemoglobin: 16.5 (ref 13.5–17.5)
Neutrophils Absolute: 7
Platelets: 160 (ref 150–399)
WBC: 11.5

## 2020-10-18 LAB — BASIC METABOLIC PANEL
BUN: 17 (ref 4–21)
CO2: 25 — AB (ref 13–22)
Chloride: 106 (ref 99–108)
Creatinine: 1.8 — AB (ref 0.6–1.3)
Glucose: 105
Potassium: 4.1 (ref 3.4–5.3)
Sodium: 144 (ref 137–147)

## 2020-10-18 LAB — MICROALBUMIN, URINE: Microalb, Ur: 121.5

## 2020-10-18 LAB — CBC: RBC: 4.9 (ref 3.87–5.11)

## 2020-10-28 ENCOUNTER — Encounter: Payer: Self-pay | Admitting: Internal Medicine

## 2020-11-04 ENCOUNTER — Other Ambulatory Visit: Payer: Self-pay | Admitting: Internal Medicine

## 2020-11-08 ENCOUNTER — Ambulatory Visit: Payer: 59 | Admitting: Internal Medicine

## 2020-11-17 ENCOUNTER — Ambulatory Visit: Payer: 59 | Admitting: Internal Medicine

## 2020-11-17 ENCOUNTER — Encounter: Payer: Self-pay | Admitting: Internal Medicine

## 2020-11-17 ENCOUNTER — Other Ambulatory Visit: Payer: Self-pay

## 2020-11-17 VITALS — BP 162/108 | HR 76 | Temp 98.3°F | Resp 16 | Ht 72.0 in | Wt 198.0 lb

## 2020-11-17 DIAGNOSIS — N1831 Chronic kidney disease, stage 3a: Secondary | ICD-10-CM | POA: Diagnosis not present

## 2020-11-17 DIAGNOSIS — E039 Hypothyroidism, unspecified: Secondary | ICD-10-CM

## 2020-11-17 DIAGNOSIS — E119 Type 2 diabetes mellitus without complications: Secondary | ICD-10-CM

## 2020-11-17 DIAGNOSIS — Z0100 Encounter for examination of eyes and vision without abnormal findings: Secondary | ICD-10-CM

## 2020-11-17 DIAGNOSIS — Z794 Long term (current) use of insulin: Secondary | ICD-10-CM

## 2020-11-17 DIAGNOSIS — E1165 Type 2 diabetes mellitus with hyperglycemia: Secondary | ICD-10-CM

## 2020-11-17 MED ORDER — CARVEDILOL 25 MG PO TABS: 25.0000 mg | ORAL_TABLET | Freq: Two times a day (BID) | ORAL | 3 refills | Status: DC

## 2020-11-17 NOTE — Patient Instructions (Addendum)
Per our records your last diabetic eye exam was in 2020, it is important for your vision to have these done at least once yearly. We have placed a referral back to [facility] where you have had these done previously. Please expect a call in the next several days to schedule a visit.    Please increase carvedilol to 25 mg: 1 tablet twice a day.  We will see if that helps improve your blood pressure. Check your blood pressure every other day, your goal is 130/80 or less.  If you are not getting to goal, please contact your nephrologist (kidney doctor).   GO TO THE LAB : Get the blood work     GO TO THE FRONT DESK, PLEASE SCHEDULE YOUR APPOINTMENTS Come back for   for a checkup in 4 months

## 2020-11-17 NOTE — Progress Notes (Signed)
Subjective:    Patient ID: Alex Phillips, male    DOB: 08-12-1960, 60 y.o.   MRN: [id number]  DOS:  11/17/2020 Type of visit - description: Follow-up  Today with talk about hypertension, kidney failure, DM, hypothyroidism. Good compliance with medications. Has noticed only very mild swelling at the lower extremities mostly a sock mark  Review of Systems See above   Past Medical History:  Diagnosis Date  . Bell's palsy   . Diabetes mellitus 11/09   A1c- 6.2  . DJD (degenerative joint disease)    l knee  . EKG, abnormal 12-2007   Stress test (-)   . Elevated PSA 2010   saw urology, PSA was reched and found to be normal. Bx was canceled, did get a u/s d/t michrohematuris- normal  . Elevated WBC count    saw Dr.Enenever; likely benign, monitor  . HTN (hypertension) 11/14/2006  . Hyperlipidemia   . Hypertension   . Hypothyroidism     Past Surgical History:  Procedure Laterality Date  . MOUTH SURGERY     crown extention on right upper   . TONSILLECTOMY AND ADENOIDECTOMY    . TYMPANOSTOMY TUBE PLACEMENT     B as a child  . VASECTOMY  02/2009    Allergies as of 11/17/2020      Reactions   Amoxicillin-pot Clavulanate    REACTION: rash/swelling/nausea   Cefaclor    Chantix [varenicline] Other (See Comments)   Aggression per Pt's wife   Januvia [sitagliptin]    Rash? Questionable, see OV 12-18-16      Medication List       Accurate as of Nov 17, 2020 11:59 PM. If you have any questions, ask your nurse or doctor.        STOP taking these medications   labetalol 300 MG tablet Commonly known as: NORMODYNE Stopped by: Willow Ora, MD     TAKE these medications   amLODipine 10 MG tablet Commonly known as: NORVASC Take 5 mg by mouth daily.   aspirin 81 MG tablet Take 81 mg by mouth daily.   carvedilol 25 MG tablet Commonly known as: COREG Take 1 tablet (25 mg total) by mouth 2 (two) times daily with a meal. What changed:   medication strength  how much to  take  when to take this Changed by: Willow Ora, MD   dapagliflozin propanediol 5 MG Tabs tablet Commonly known as: Farxiga Take 1 tablet (5 mg total) by mouth daily.   levothyroxine 125 MCG tablet Commonly known as: SYNTHROID Take 1 tablet (125 mcg total) by mouth daily before breakfast.   magnesium oxide 400 MG tablet Commonly known as: MAG-OX Take 400 mg by mouth daily.   nitroGLYCERIN 0.4 MG SL tablet Commonly known as: NITROSTAT Place 1 tablet (0.4 mg total) under the tongue every 5 (five) minutes as needed for chest pain (3 doses maximun).   rosuvastatin 40 MG tablet Commonly known as: CRESTOR Take 1 tablet (40 mg total) by mouth daily.          Objective:   Physical Exam BP (!) 162/108 (BP Location: Left Arm, Patient Position: Sitting, Cuff Size: Small)   Pulse 76   Temp 98.3 F (36.8 C) (Oral)   Resp 16   Ht 6' (1.829 m)   Wt 198 lb (89.8 kg)   SpO2 97%   BMI 26.85 kg/m  General:   Well developed, NAD, BMI noted. HEENT:  Normocephalic . Face symmetric, atraumatic Lungs:  CTA  B Normal respiratory effort, no intercostal retractions, no accessory muscle use. Heart: RRR,  no murmur.  Lower extremities: no pretibial edema bilaterally  Skin: Not pale. Not jaundice Neurologic:  alert & oriented X3.  Speech normal, gait appropriate for age and unassisted Psych--  Cognition and judgment appear intact.  Cooperative with normal attention span and concentration.  Behavior appropriate. No anxious or depressed appearing.      Assessment    Assessment  DM HTN Hyperlipidemia Hypothyroidism DJD Bell's palsy Leukocytosis, benign per Hem, monitoring Coronary calcifications, abnormal stress test 01/20/2019.  See cardiology note 01-2019 Tobacco  Abuse   PLAN: DM, last A1c 7.2, history of excessive weight loss with Ozempic, avoiding metformin due to decreased kidney function, was Rx Farxiga 5 mg.  Good compliance, ambulatory CBGs remain in the 130s, 140s.  Will  check A1c, consider increase Farxiga to 10 mg. HTN: Recently nephrology increase amlodipine from 5 mg to 10 mg because BP was above his goal of 130/80.  Also, they changed  labetalol to carvedilol 12.5 mg twice daily. Ambulatory BPs remain elevated in the 180s/111 range. Plan: Increase carvedilol to 25 mg twice daily, monitor BPs, low-salt diet, if not at goal soon strongly recommend to contact nephrology. CKD, proteinuria: Last visit with nephrology 10/18/2020, note reviewed. They rec to continue holding losartan HCT, creatinine at that time was 1.8.  Urine sediment was bland Hypothyroidism: Check TSH RTC 4 months  This visit occurred during the SARS-CoV-2 public health emergency.  Safety protocols were in place, including screening questions prior to the visit, additional usage of staff PPE, and extensive cleaning of exam room while observing appropriate contact time as indicated for disinfecting solutions.

## 2020-11-18 LAB — HEMOGLOBIN A1C: Hgb A1c MFr Bld: 7.1 % — ABNORMAL HIGH (ref 4.6–6.5)

## 2020-11-18 LAB — TSH: TSH: 7.95 u[IU]/mL — ABNORMAL HIGH (ref 0.35–4.50)

## 2020-11-18 NOTE — Assessment & Plan Note (Signed)
DM, last A1c 7.2, history of excessive weight loss with Ozempic, avoiding metformin due to decreased kidney function, was Rx Farxiga 5 mg.  Good compliance, ambulatory CBGs remain in the 130s, 140s.  Will check A1c, consider increase Farxiga to 10 mg. HTN: Recently nephrology increase amlodipine from 5 mg to 10 mg because BP was above his goal of 130/80.  Also, they changed  labetalol to carvedilol 12.5 mg twice daily. Ambulatory BPs remain elevated in the 180s/111 range. Plan: Increase carvedilol to 25 mg twice daily, monitor BPs, low-salt diet, if not at goal soon strongly recommend to contact nephrology. CKD, proteinuria: Last visit with nephrology 10/18/2020, note reviewed. They rec to continue holding losartan HCT, creatinine at that time was 1.8.  Urine sediment was bland Hypothyroidism: Check TSH RTC 4 months

## 2020-11-23 MED ORDER — DAPAGLIFLOZIN PROPANEDIOL 10 MG PO TABS: 10.0000 mg | ORAL_TABLET | Freq: Every day | ORAL | 3 refills | Status: DC

## 2020-11-23 MED ORDER — LEVOTHYROXINE SODIUM 150 MCG PO TABS: 150.0000 ug | ORAL_TABLET | Freq: Every day | ORAL | 3 refills | Status: DC

## 2020-11-23 NOTE — Addendum Note (Signed)
Addended by: Steve Rattler A on: 11/23/2020 10:40 AM   Modules accepted: Orders

## 2021-01-03 ENCOUNTER — Other Ambulatory Visit: Payer: Self-pay | Admitting: *Deleted

## 2021-01-03 DIAGNOSIS — E1165 Type 2 diabetes mellitus with hyperglycemia: Secondary | ICD-10-CM

## 2021-01-03 DIAGNOSIS — E039 Hypothyroidism, unspecified: Secondary | ICD-10-CM

## 2021-01-04 ENCOUNTER — Other Ambulatory Visit (INDEPENDENT_AMBULATORY_CARE_PROVIDER_SITE_OTHER): Payer: 59

## 2021-01-04 ENCOUNTER — Other Ambulatory Visit: Payer: Self-pay

## 2021-01-04 DIAGNOSIS — E039 Hypothyroidism, unspecified: Secondary | ICD-10-CM

## 2021-01-04 DIAGNOSIS — E1165 Type 2 diabetes mellitus with hyperglycemia: Secondary | ICD-10-CM

## 2021-01-04 DIAGNOSIS — Z794 Long term (current) use of insulin: Secondary | ICD-10-CM | POA: Diagnosis not present

## 2021-01-04 LAB — BASIC METABOLIC PANEL
BUN: 20 mg/dL (ref 6–23)
CO2: 28 mEq/L (ref 19–32)
Calcium: 9.4 mg/dL (ref 8.4–10.5)
Chloride: 103 mEq/L (ref 96–112)
Creatinine, Ser: 1.53 mg/dL — ABNORMAL HIGH (ref 0.40–1.50)
GFR: 49.43 mL/min — ABNORMAL LOW (ref >60.00)
Glucose, Bld: 152 mg/dL — ABNORMAL HIGH (ref 70–99)
Potassium: 3.7 mEq/L (ref 3.5–5.1)
Sodium: 140 mEq/L (ref 135–145)

## 2021-01-04 LAB — TSH: TSH: 4.96 u[IU]/mL (ref 0.35–5.50)

## 2021-01-06 MED ORDER — LEVOTHYROXINE SODIUM 150 MCG PO TABS: 150.0000 ug | ORAL_TABLET | Freq: Every day | ORAL | 1 refills | Status: DC

## 2021-01-06 MED ORDER — DAPAGLIFLOZIN PROPANEDIOL 10 MG PO TABS: 10.0000 mg | ORAL_TABLET | Freq: Every day | ORAL | 1 refills | Status: DC

## 2021-01-06 NOTE — Addendum Note (Signed)
Addended byConrad Carrizo D on: 01/06/2021 07:48 AM   Modules accepted: Orders

## 2021-01-15 ENCOUNTER — Other Ambulatory Visit: Payer: Self-pay | Admitting: Internal Medicine

## 2021-03-11 ENCOUNTER — Other Ambulatory Visit: Payer: Self-pay

## 2021-03-11 DIAGNOSIS — F17200 Nicotine dependence, unspecified, uncomplicated: Secondary | ICD-10-CM

## 2021-03-12 ENCOUNTER — Other Ambulatory Visit: Payer: Self-pay | Admitting: Internal Medicine

## 2021-03-22 ENCOUNTER — Ambulatory Visit: Payer: 59 | Admitting: Internal Medicine

## 2021-03-31 ENCOUNTER — Encounter: Payer: Self-pay | Admitting: Internal Medicine

## 2021-03-31 ENCOUNTER — Ambulatory Visit (INDEPENDENT_AMBULATORY_CARE_PROVIDER_SITE_OTHER): Payer: 59 | Admitting: Internal Medicine

## 2021-03-31 ENCOUNTER — Other Ambulatory Visit: Payer: Self-pay

## 2021-03-31 VITALS — BP 143/93 | HR 65 | Temp 97.6°F | Resp 12 | Ht 72.0 in | Wt 206.6 lb

## 2021-03-31 DIAGNOSIS — R790 Abnormal level of blood mineral: Secondary | ICD-10-CM | POA: Diagnosis not present

## 2021-03-31 DIAGNOSIS — E785 Hyperlipidemia, unspecified: Secondary | ICD-10-CM | POA: Diagnosis not present

## 2021-03-31 DIAGNOSIS — E1169 Type 2 diabetes mellitus with other specified complication: Secondary | ICD-10-CM | POA: Diagnosis not present

## 2021-03-31 DIAGNOSIS — I10 Essential (primary) hypertension: Secondary | ICD-10-CM

## 2021-03-31 DIAGNOSIS — E1165 Type 2 diabetes mellitus with hyperglycemia: Secondary | ICD-10-CM | POA: Diagnosis not present

## 2021-03-31 DIAGNOSIS — Z794 Long term (current) use of insulin: Secondary | ICD-10-CM | POA: Diagnosis not present

## 2021-03-31 DIAGNOSIS — E039 Hypothyroidism, unspecified: Secondary | ICD-10-CM | POA: Diagnosis not present

## 2021-03-31 DIAGNOSIS — N1831 Chronic kidney disease, stage 3a: Secondary | ICD-10-CM

## 2021-03-31 LAB — LIPID PANEL
Cholesterol: 129 mg/dL (ref 0–200)
HDL: 39.4 mg/dL (ref >39.00)
LDL Cholesterol: 60 mg/dL (ref 0–99)
NonHDL: 89.82
Total CHOL/HDL Ratio: 3
Triglycerides: 151 mg/dL — ABNORMAL HIGH (ref 0.0–149.0)
VLDL: 30.2 mg/dL (ref 0.0–40.0)

## 2021-03-31 LAB — AST: AST: 17 U/L (ref 0–37)

## 2021-03-31 LAB — HEMOGLOBIN A1C: Hgb A1c MFr Bld: 8 % — ABNORMAL HIGH (ref 4.6–6.5)

## 2021-03-31 LAB — MAGNESIUM: Magnesium: 2.3 mg/dL (ref 1.5–2.5)

## 2021-03-31 LAB — TSH: TSH: 6.26 u[IU]/mL — ABNORMAL HIGH (ref 0.35–5.50)

## 2021-03-31 LAB — ALT: ALT: 24 U/L (ref 0–53)

## 2021-03-31 NOTE — Progress Notes (Signed)
Subjective:    Patient ID: Alex Phillips, male    DOB: 08/26/1960, 60 y.o.   MRN: [id number]  DOS:  03/31/2021 Type of visit - description: F/U  Today with talk about diabetes, hypertension, high cholesterol. Also vaccinations. He saw nephrology, note reviewed, his BP medications were adjusted. Denies chest pain or difficulty breathing. Had temporarily lower extremity edema but once he start losartan edema got better. Denies lower extremity paresthesias.   Review of Systems See above   Past Medical History:  Diagnosis Date   Bell's palsy    Diabetes mellitus 11/09   A1c- 6.2   DJD (degenerative joint disease)    l knee   EKG, abnormal 12-2007   Stress test (-)    Elevated PSA 2010   saw urology, PSA was reched and found to be normal. Bx was canceled, did get a u/s d/t michrohematuris- normal   Elevated WBC count    saw Dr.Enenever; likely benign, monitor   HTN (hypertension) 11/14/2006   Hyperlipidemia    Hypertension    Hypothyroidism     Past Surgical History:  Procedure Laterality Date   MOUTH SURGERY     crown extention on right upper    TONSILLECTOMY AND ADENOIDECTOMY     TYMPANOSTOMY TUBE PLACEMENT     B as a child   VASECTOMY  02/2009    Allergies as of 03/31/2021       Reactions   Amoxicillin-pot Clavulanate    REACTION: rash/swelling/nausea   Cefaclor    Chantix [varenicline] Other (See Comments)   Aggression per Pt's wife   Januvia [sitagliptin]    Rash? Questionable, see OV 12-18-16        Medication List        Accurate as of March 31, 2021 11:59 PM. If you have any questions, ask your nurse or doctor.          amLODipine 10 MG tablet Commonly known as: NORVASC Take 5 mg by mouth daily.   aspirin 81 MG tablet Take 81 mg by mouth daily.   carvedilol 25 MG tablet Commonly known as: COREG TAKE 1 TABLET(25 MG) BY MOUTH TWICE DAILY WITH A MEAL   dapagliflozin propanediol 10 MG Tabs tablet Commonly known as: Farxiga Take 1  tablet (10 mg total) by mouth daily before breakfast.   levothyroxine 150 MCG tablet Commonly known as: Synthroid Take 1 tablet (150 mcg total) by mouth daily before breakfast.   losartan 50 MG tablet Commonly known as: COZAAR Take 50 mg by mouth daily.   magnesium oxide 400 MG tablet Commonly known as: MAG-OX Take 400 mg by mouth daily.   nitroGLYCERIN 0.4 MG SL tablet Commonly known as: NITROSTAT Place 1 tablet (0.4 mg total) under the tongue every 5 (five) minutes as needed for chest pain (3 doses maximun).   rosuvastatin 40 MG tablet Commonly known as: CRESTOR TAKE 1 TABLET BY MOUTH EVERY DAY           Objective:   Physical Exam BP (!) 143/93 (BP Location: Left Arm, Patient Position: Sitting, Cuff Size: Large)   Pulse 65   Temp 97.6 F (36.4 C) (Oral)   Resp 12   Ht 6' (1.829 m)   Wt 206 lb 9.6 oz (93.7 kg)   SpO2 97%   BMI 28.02 kg/m  General:   Well developed, NAD, BMI noted. HEENT:  Normocephalic . Face symmetric, atraumatic Lungs:  CTA B Normal respiratory effort, no intercostal retractions, no accessory muscle use.  Heart: RRR,  no murmur.  DM foot exam: No edema, good pedal pulses, pinprick examination normal Skin: Not pale. Not jaundice Neurologic:  alert & oriented X3.  Speech normal, gait appropriate for age and unassisted Psych--  Cognition and judgment appear intact.  Cooperative with normal attention span and concentration.  Behavior appropriate. No anxious or depressed appearing.      Assessment    Assessment  DM HTN Hyperlipidemia Hypothyroidism DJD Bell's palsy Leukocytosis, benign per Hem, monitoring Coronary calcifications, abnormal stress test 01/20/2019.  See cardiology note 01-2019 Tobacco  Abuse   PLAN: DM: Last A1c was not at goal, Farxiga increase from 5 mg to 10 mg.  Encouraged to follow a healthy diet.  Check A1c.  Foot exam normal HTN: Since the last visit, nephrology started losartan 25 mg, subsequently increased to  50 mg, follow-up blood work was okay per patient. He also takes amlodipine and carvedilol.  BP today is a slightly elevated, no recent ambulatory BPs. Plan: No change, monitor BPs, reach out to nephrology if not at goal, see AVS. CKD, proteinuria: Per nephrology, last seen 12-2020. Hyperlipidemia: On Crestor, check FLP Low magnesium?  Patient used to take magnesium but quit, would like his levels checked. Hypothyroidism: On Synthroid, check TSH Preventive care: Again declines flu or COVID vaccines RTC 4 months CPX     This visit occurred during the SARS-CoV-2 public health emergency.  Safety protocols were in place, including screening questions prior to the visit, additional usage of staff PPE, and extensive cleaning of exam room while observing appropriate contact time as indicated for disinfecting solutions.

## 2021-03-31 NOTE — Patient Instructions (Signed)
Check the  blood pressure 3 or 4 times a week. BP GOAL is around 130/80. If you are not at goal, please contact your kidney doctor (nephrology.   Watch your diet carefully   GO TO THE LAB : Get the blood work     GO TO THE FRONT DESK, PLEASE SCHEDULE YOUR APPOINTMENTS Come back for a physical exam in 4 months

## 2021-04-01 NOTE — Assessment & Plan Note (Signed)
DM: Last A1c was not at goal, Farxiga increase from 5 mg to 10 mg.  Encouraged to follow a healthy diet.  Check A1c.  Foot exam normal HTN: Since the last visit, nephrology started losartan 25 mg, subsequently increased to 50 mg, follow-up blood work was okay per patient. He also takes amlodipine and carvedilol.  BP today is a slightly elevated, no recent ambulatory BPs. Plan: No change, monitor BPs, reach out to nephrology if not at goal, see AVS. CKD, proteinuria: Per nephrology, last seen 12-2020. Hyperlipidemia: On Crestor, check FLP Low magnesium?  Patient used to take magnesium but quit, would like his levels checked. Hypothyroidism: On Synthroid, check TSH Preventive care: Again declines flu or COVID vaccines RTC 4 months CPX

## 2021-04-04 MED ORDER — PIOGLITAZONE HCL 30 MG PO TABS: 30.0000 mg | ORAL_TABLET | Freq: Every day | ORAL | 1 refills | Status: DC

## 2021-04-04 NOTE — Addendum Note (Signed)
Addended by: Conrad [place] D on: 04/04/2021 07:31 AM   Modules accepted: Orders

## 2021-05-09 ENCOUNTER — Other Ambulatory Visit: Payer: Self-pay | Admitting: *Deleted

## 2021-05-09 DIAGNOSIS — F17210 Nicotine dependence, cigarettes, uncomplicated: Secondary | ICD-10-CM

## 2021-05-24 LAB — HM DIABETES EYE EXAM

## 2021-06-01 ENCOUNTER — Encounter: Payer: Self-pay | Admitting: Acute Care

## 2021-06-01 ENCOUNTER — Telehealth (INDEPENDENT_AMBULATORY_CARE_PROVIDER_SITE_OTHER): Payer: 59 | Admitting: Acute Care

## 2021-06-01 ENCOUNTER — Ambulatory Visit (HOSPITAL_BASED_OUTPATIENT_CLINIC_OR_DEPARTMENT_OTHER)
Admission: RE | Admit: 2021-06-01 | Discharge: 2021-06-01 | Disposition: A | Discharge status: Home / Self Care | Payer: 59 | Source: Ambulatory Visit | Attending: Internal Medicine | Admitting: Internal Medicine

## 2021-06-01 ENCOUNTER — Other Ambulatory Visit: Payer: Self-pay

## 2021-06-01 DIAGNOSIS — F17210 Nicotine dependence, cigarettes, uncomplicated: Secondary | ICD-10-CM | POA: Diagnosis present

## 2021-06-01 NOTE — Patient Instructions (Signed)
Thank you for participating in the [facility]. °It was our pleasure to meet you today. °We will call you with the results of your scan within the next few days. °Your scan will be assigned a Lung RADS category score by the physicians reading the scans.  °This Lung RADS score determines follow up scanning.  °See below for description of categories, and follow up screening recommendations. °We will be in touch to schedule your follow up screening annually or based on recommendations of our providers. °We will fax a copy of your scan results to your Primary Care Physician, or the physician who referred you to the program, to ensure they have the results. °Please call the office if you have any questions or concerns regarding your scanning experience or results.  °Our office number is [phone]. °Please speak with Denise Phelps, RN. She is our Lung Cancer Screening RN. °If she is unavailable when you call, please have the office staff send her a message. She will return your call at her earliest convenience. °Remember, if your scan is normal, we will scan you annually as long as you continue to meet the criteria for the program. (Age 55-77, Current smoker or smoker who has quit within the last 15 years). °If you are a smoker, remember, quitting is the single most powerful action that you can take to decrease your risk of lung cancer and other pulmonary, breathing related problems. °We know quitting is hard, and we are here to help.  °Please let us know if there is anything we can do to help you meet your goal of quitting. °If you are a former smoker, congratulations. We are proud of you! Remain smoke free! °Remember you can refer friends or family members through the number above.  °We will screen them to make sure they meet criteria for the program. °Thank you for helping us take better care of you by participating in Lung Screening. ° °You can receive free nicotine replacement therapy  ( patches, gum or mints) by calling 1-800-QUIT NOW. Please call so we can get you on the path to becoming  a non-smoker. I know it is hard, but you can do this! ° °Lung RADS Categories: ° °Lung RADS 1: no nodules or definitely non-concerning nodules.  °Recommendation is for a repeat annual scan in 12 months. ° °Lung RADS 2:  nodules that are non-concerning in appearance and behavior with a very low likelihood of becoming an active cancer. °Recommendation is for a repeat annual scan in 12 months. ° °Lung RADS 3: nodules that are probably non-concerning , includes nodules with a low likelihood of becoming an active cancer.  Recommendation is for a 6-month repeat screening scan. Often noted after an upper respiratory illness. We will be in touch to make sure you have no questions, and to schedule your 6-month scan. ° °Lung RADS 4 A: nodules with concerning findings, recommendation is most often for a follow up scan in 3 months or additional testing based on our provider's assessment of the scan. We will be in touch to make sure you have no questions and to schedule the recommended 3 month follow up scan. ° °Lung RADS 4 B:  indicates findings that are concerning. We will be in touch with you to schedule additional diagnostic testing based on our provider's  assessment of the scan. ° °Hypnosis for smoking cessation  °[employer] °[phone] ° °Acupuncture for smoking cessation  °[facility] °[phone]  °

## 2021-06-01 NOTE — Progress Notes (Signed)
Virtual Visit via Video Note  I connected with Alex Phillips on 06/01/21 at 11:30 AM EST by a video enabled telemedicine application and verified that I am speaking with the correct person using two identifiers.  Location: Patient: At home Provider: [address], [place], [place], [address]    I discussed the limitations of evaluation and management by telemedicine and the availability of in person appointments. The patient expressed understanding and agreed to proceed.  Shared Decision Making Visit Lung Cancer Screening Program [phone])   Eligibility: Age 60 y.o. Pack Years Smoking History Calculation 37 pack year smoking history (# packs/per year x # years smoked) Recent History of coughing up blood  no Unexplained weight loss? no ( >Than 15 pounds within the last 6 months ) Prior History Lung / other cancer no (Diagnosis within the last 5 years already requiring surveillance chest CT Scans). Smoking Status Current Smoker Former Smokers: Years since quit: NA  Quit Date: NA  Visit Components: Discussion included one or more decision making aids. yes Discussion included risk/benefits of screening. yes Discussion included potential follow up diagnostic testing for abnormal scans. yes Discussion included meaning and risk of over diagnosis. yes Discussion included meaning and risk of False Positives. yes Discussion included meaning of total radiation exposure. yes  Counseling Included: Importance of adherence to annual lung cancer LDCT screening. yes Impact of comorbidities on ability to participate in the program. yes Ability and willingness to under diagnostic treatment. yes  Smoking Cessation Counseling: Current Smokers:  Discussed importance of smoking cessation. yes Information about tobacco cessation classes and interventions provided to patient. yes Patient provided with "ticket" for LDCT Scan. yes Symptomatic Patient. no  Counseling Diagnosis Code: Tobacco  Use Z72.0 Asymptomatic Patient yes  Counseling (Intermediate counseling: > three minutes counseling) M0947 Former Smokers:  Discussed the importance of maintaining cigarette abstinence. yes Diagnosis Code: Personal History of Nicotine Dependence. S96.283 Information about tobacco cessation classes and interventions provided to patient. Yes Patient provided with "ticket" for LDCT Scan. yes Written Order for Lung Cancer Screening with LDCT placed in Epic. Yes (CT Chest Lung Cancer Screening Low Dose W/O CM) [id number] Z12.2-Screening of respiratory organs Z87.891-Personal history of nicotine dependence  I have spent 25 minutes of face to face/ virtual visit   time with Alex Phillips discussing the risks and benefits of lung cancer screening. We viewed / discussed a power point together that explained in detail the above noted topics. We paused at intervals to allow for questions to be asked and answered to ensure understanding.We discussed that the single most powerful action that he can take to decrease his risk of developing lung cancer is to quit smoking. We discussed whether or not he is ready to commit to setting a quit date. We discussed options for tools to aid in quitting smoking including nicotine replacement therapy, non-nicotine medications, support groups, Quit Smart classes, and behavior modification. We discussed that often times setting smaller, more achievable goals, such as eliminating 1 cigarette a day for a week and then 2 cigarettes a day for a week can be helpful in slowly decreasing the number of cigarettes smoked. This allows for a sense of accomplishment as well as providing a clinical benefit. I provided  him  with smoking cessation  information  with contact information for community resources, classes, free nicotine replacement therapy, and access to mobile apps, text messaging, and on-line smoking cessation help. I have also provided  him  the office contact information in the  event  he needs to contact me, or the screening staff. We discussed the time and location of the scan, and that either Abigail Miyamoto RN, Karlton Lemon, RN  or I will call / send a letter with the results within 24-72 hours of receiving them. The patient verbalized understanding of all of  the above and had no further questions upon leaving the office. They have my contact information in the event they have any further questions.  I spent 3 minutes counseling on smoking cessation and the health risks of continued tobacco abuse.  I explained to the patient that there has been a high incidence of coronary artery disease noted on these exams. I explained that this is a non-gated exam therefore degree or severity cannot be determined. This patient is on statin therapy. I have asked the patient to follow-up with their PCP regarding any incidental finding of coronary artery disease and management with diet or medication as their PCP  feels is clinically indicated. The patient verbalized understanding of the above and had no further questions upon completion of the visit.      Bevelyn Ngo, NP 06/01/2021

## 2021-06-03 ENCOUNTER — Other Ambulatory Visit: Payer: Self-pay | Admitting: Acute Care

## 2021-06-03 DIAGNOSIS — F17210 Nicotine dependence, cigarettes, uncomplicated: Secondary | ICD-10-CM

## 2021-06-03 DIAGNOSIS — Z87891 Personal history of nicotine dependence: Secondary | ICD-10-CM

## 2021-06-03 DIAGNOSIS — F17200 Nicotine dependence, unspecified, uncomplicated: Secondary | ICD-10-CM

## 2021-07-14 LAB — CBC: RBC: 5.48 — AB (ref 3.87–5.11)

## 2021-07-14 LAB — BASIC METABOLIC PANEL
BUN: 18 (ref 4–21)
CO2: 23 — AB (ref 13–22)
Chloride: 106 (ref 99–108)
Creatinine: 1.5 — AB (ref 0.6–1.3)
Glucose: 133
Potassium: 4.6 (ref 3.4–5.3)
Sodium: 145 (ref 137–147)

## 2021-07-14 LAB — CBC AND DIFFERENTIAL
HCT: 54 — AB (ref 41–53)
Hemoglobin: 18.4 — AB (ref 13.5–17.5)
Neutrophils Absolute: 4.9
Platelets: 147 — AB (ref 150–399)
WBC: 7.8

## 2021-07-14 LAB — COMPREHENSIVE METABOLIC PANEL
Albumin: 4.6 (ref 3.5–5.0)
Calcium: 9.4 (ref 8.7–10.7)
GFR calc non Af Amer: 54

## 2021-07-20 ENCOUNTER — Other Ambulatory Visit: Payer: Self-pay | Admitting: Nephrology

## 2021-07-20 DIAGNOSIS — N183 Chronic kidney disease, stage 3 unspecified: Secondary | ICD-10-CM

## 2021-07-22 ENCOUNTER — Ambulatory Visit
Admission: RE | Admit: 2021-07-22 | Discharge: 2021-07-22 | Disposition: A | Discharge status: Home / Self Care | Payer: Managed Care, Other (non HMO) | Source: Ambulatory Visit | Attending: Nephrology | Admitting: Nephrology

## 2021-07-22 ENCOUNTER — Other Ambulatory Visit: Payer: Self-pay | Admitting: Family

## 2021-07-22 DIAGNOSIS — N183 Chronic kidney disease, stage 3 unspecified: Secondary | ICD-10-CM

## 2021-07-22 DIAGNOSIS — D751 Secondary polycythemia: Secondary | ICD-10-CM

## 2021-07-25 ENCOUNTER — Encounter: Payer: Self-pay | Admitting: Family

## 2021-07-25 ENCOUNTER — Inpatient Hospital Stay: Payer: Managed Care, Other (non HMO) | Attending: Hematology & Oncology

## 2021-07-25 ENCOUNTER — Other Ambulatory Visit: Payer: Self-pay

## 2021-07-25 ENCOUNTER — Inpatient Hospital Stay: Payer: Managed Care, Other (non HMO) | Admitting: Family

## 2021-07-25 VITALS — BP 157/92 | HR 65 | Temp 98.4°F | Resp 18 | Ht 72.0 in | Wt 220.8 lb

## 2021-07-25 DIAGNOSIS — Z7984 Long term (current) use of oral hypoglycemic drugs: Secondary | ICD-10-CM | POA: Diagnosis not present

## 2021-07-25 DIAGNOSIS — Z86010 Personal history of colonic polyps: Secondary | ICD-10-CM | POA: Insufficient documentation

## 2021-07-25 DIAGNOSIS — D751 Secondary polycythemia: Secondary | ICD-10-CM

## 2021-07-25 DIAGNOSIS — Z7982 Long term (current) use of aspirin: Secondary | ICD-10-CM | POA: Diagnosis not present

## 2021-07-25 DIAGNOSIS — J439 Emphysema, unspecified: Secondary | ICD-10-CM | POA: Diagnosis not present

## 2021-07-25 DIAGNOSIS — I10 Essential (primary) hypertension: Secondary | ICD-10-CM | POA: Insufficient documentation

## 2021-07-25 DIAGNOSIS — E785 Hyperlipidemia, unspecified: Secondary | ICD-10-CM | POA: Diagnosis not present

## 2021-07-25 DIAGNOSIS — F17210 Nicotine dependence, cigarettes, uncomplicated: Secondary | ICD-10-CM | POA: Diagnosis not present

## 2021-07-25 DIAGNOSIS — E039 Hypothyroidism, unspecified: Secondary | ICD-10-CM | POA: Insufficient documentation

## 2021-07-25 DIAGNOSIS — E119 Type 2 diabetes mellitus without complications: Secondary | ICD-10-CM | POA: Insufficient documentation

## 2021-07-25 LAB — CMP (CANCER CENTER ONLY)
ALT: 16 U/L (ref 0–44)
AST: 14 U/L — ABNORMAL LOW (ref 15–41)
Albumin: 4.5 g/dL (ref 3.5–5.0)
Alkaline Phosphatase: 79 U/L (ref 38–126)
Anion gap: 8 (ref 5–15)
BUN: 21 mg/dL — ABNORMAL HIGH (ref 6–20)
CO2: 28 mmol/L (ref 22–32)
Calcium: 9.8 mg/dL (ref 8.9–10.3)
Chloride: 104 mmol/L (ref 98–111)
Creatinine: 1.49 mg/dL — ABNORMAL HIGH (ref 0.61–1.24)
GFR, Estimated: 53 mL/min — ABNORMAL LOW (ref >60)
Glucose, Bld: 125 mg/dL — ABNORMAL HIGH (ref 70–99)
Potassium: 3.9 mmol/L (ref 3.5–5.1)
Sodium: 140 mmol/L (ref 135–145)
Total Bilirubin: 0.4 mg/dL (ref 0.3–1.2)
Total Protein: 7.3 g/dL (ref 6.5–8.1)

## 2021-07-25 LAB — LACTATE DEHYDROGENASE: LDH: 178 U/L (ref 98–192)

## 2021-07-25 LAB — CBC WITH DIFFERENTIAL (CANCER CENTER ONLY)
Abs Immature Granulocytes: 0.04 10*3/uL (ref 0.00–0.07)
Basophils Absolute: 0.1 10*3/uL (ref 0.0–0.1)
Basophils Relative: 1 %
Eosinophils Absolute: 0.3 10*3/uL (ref 0.0–0.5)
Eosinophils Relative: 4 %
HCT: 51.4 % (ref 39.0–52.0)
Hemoglobin: 17.7 g/dL — ABNORMAL HIGH (ref 13.0–17.0)
Immature Granulocytes: 1 %
Lymphocytes Relative: 30 %
Lymphs Abs: 2.4 10*3/uL (ref 0.7–4.0)
MCH: 33.6 pg (ref 26.0–34.0)
MCHC: 34.4 g/dL (ref 30.0–36.0)
MCV: 97.5 fL (ref 80.0–100.0)
Monocytes Absolute: 0.7 10*3/uL (ref 0.1–1.0)
Monocytes Relative: 9 %
Neutro Abs: 4.4 10*3/uL (ref 1.7–7.7)
Neutrophils Relative %: 55 %
Platelet Count: 147 10*3/uL — ABNORMAL LOW (ref 150–400)
RBC: 5.27 MIL/uL (ref 4.22–5.81)
RDW: 13 % (ref 11.5–15.5)
WBC Count: 7.9 10*3/uL (ref 4.0–10.5)
nRBC: 0 % (ref 0.0–0.2)

## 2021-07-25 LAB — IRON AND IRON BINDING CAPACITY (CC-WL,HP ONLY)
Iron: 139 ug/dL (ref 45–182)
Saturation Ratios: 30 % (ref 17.9–39.5)
TIBC: 458 ug/dL — ABNORMAL HIGH (ref 250–450)
UIBC: 319 ug/dL (ref 117–376)

## 2021-07-25 LAB — SAVE SMEAR(SSMR), FOR PROVIDER SLIDE REVIEW

## 2021-07-25 LAB — FERRITIN: Ferritin: 62 ng/mL (ref 24–336)

## 2021-07-25 NOTE — Progress Notes (Signed)
Hematology/Oncology Consultation   Name: Alex Phillips      MRN: [id number]    Location: Room/bed info not found  Date: 07/25/2021 Time:9:04 AM   REFERRING PHYSICIAN: Anthony Sar, MD  REASON FOR CONSULT: Polycythemia   DIAGNOSIS: Erythrocytosis  HISTORY OF PRESENT ILLNESS: [name] is a very pleasant 61 yo caucasian gentleman with erythrocytosis over the last few months.  He is a smoker, 1 1/2 ppd but quit yesterday and is wearing a nicotine patch today.  He has emphysema. He denies sleep apnea.  He denies testosterone or supplement use.  He denies history of stroke, heart attack, thrombotic event or liver disease. He is taking a baby aspirin daily.  He was adopted from [place] as a baby and it was a closed adoption so he has no known family history.  No personal history of cancer.  He is diabetic. He was recently taken off of Ozempic for extreme weight loss and is now taking [place] and Actos.  He is on synthroid for hypothyroidism.  He states that his last colonoscopy was 7-8 years ago and he has history of benign colonic polyps. He has never had an EGD.  No numbness or tingling in his extremities.  He has chronic pain in the right shoulder and states that he needs replacement surgery this year.  He has a tonsillectomy and tube in his ears as a child as well as a vasectomy in adulthood without any complications.  No fever, chills, n/v, cough, rash, dizziness, SOB, chest pain, palpitations, abdominal pain or changes in bowel or bladder habits.  No blood loss noted. No bruising or petechiae.  He has a good appetite and is staying well hydrated throughout the day.  No ETOH or recreational drug use.  He is married with 2 cats.  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   ROS: All other 10 point review of systems is negative.   PAST MEDICAL HISTORY:   Past Medical History:  Diagnosis Date   Bell's palsy    Diabetes  mellitus 11/09   A1c- 6.2   DJD (degenerative joint disease)    l knee   EKG, abnormal 12-2007   Stress test (-)    Elevated PSA 2010   saw urology, PSA was reched and found to be normal. Bx was canceled, did get a u/s d/t michrohematuris- normal   Elevated WBC count    saw Dr.Enenever; likely benign, monitor   HTN (hypertension) 11/14/2006   Hyperlipidemia    Hypertension    Hypothyroidism     ALLERGIES: Allergies  Allergen Reactions   Cefaclor Anaphylaxis and Swelling    Swelling of the throat   Chantix [Varenicline] Other (See Comments)    Aggression per Pt's wife   Amoxicillin-Pot Clavulanate Nausea Only, Swelling and Rash   Januvia [Sitagliptin] Rash      MEDICATIONS:  Current Outpatient Medications on File Prior to Visit  Medication Sig Dispense Refill   amLODipine (NORVASC) 10 MG tablet Take 5 mg by mouth daily.     aspirin 81 MG tablet Take 81 mg by mouth daily.     carvedilol (COREG) 25 MG tablet TAKE 1 TABLET(25 MG) BY MOUTH TWICE DAILY WITH A MEAL 180 tablet 1   dapagliflozin propanediol (FARXIGA) 10 MG TABS tablet Take 1 tablet (10 mg total) by mouth daily before breakfast. 90 tablet 1   levothyroxine (SYNTHROID) 150 MCG tablet Take 1 tablet (150 mcg  total) by mouth daily before breakfast. 90 tablet 1   losartan (COZAAR) 100 MG tablet Take 100 mg by mouth daily.     magnesium oxide (MAG-OX) 400 MG tablet Take 400 mg by mouth daily.     pioglitazone (ACTOS) 30 MG tablet Take 1 tablet (30 mg total) by mouth daily. 90 tablet 1   rosuvastatin (CRESTOR) 40 MG tablet TAKE 1 TABLET BY MOUTH EVERY DAY 90 tablet 1   nitroGLYCERIN (NITROSTAT) 0.4 MG SL tablet Place 1 tablet (0.4 mg total) under the tongue every 5 (five) minutes as needed for chest pain (3 doses maximun). (Patient not taking: Reported on 07/25/2021) 20 tablet 3   No current facility-administered medications on file prior to visit.     PAST SURGICAL HISTORY Past Surgical History:  Procedure Laterality Date    MOUTH SURGERY     crown extention on right upper    TONSILLECTOMY AND ADENOIDECTOMY     TYMPANOSTOMY TUBE PLACEMENT     B as a child   VASECTOMY  02/2009    FAMILY HISTORY: Family History  Adopted: Yes  Problem Relation Age of Onset   Dementia Mother    Colon cancer Neg Hx    Stomach cancer Neg Hx     SOCIAL HISTORY:  reports that he has been smoking cigarettes. He started smoking about 39 years ago. He has a 51.00 pack-year smoking history. He has never used smokeless tobacco. He reports that he does not drink alcohol and does not use drugs.  PERFORMANCE STATUS: The patient's performance status is 0 - Asymptomatic  PHYSICAL EXAM: Most Recent Vital Signs: There were no vitals taken for this visit. BP (!) 157/92 (BP Location: Left Arm, Patient Position: Sitting)    Pulse 65    Temp 98.4 F (36.9 C) (Oral)    Resp 18    Ht 6' (1.829 m)    Wt 220 lb 12.8 oz (100.2 kg)    SpO2 96%    BMI 29.95 kg/m   General Appearance:    Alert, cooperative, no distress, appears stated age  Head:    Normocephalic, without obvious abnormality, atraumatic  Eyes:    PERRL, conjunctiva/corneas clear, EOM's intact, fundi    benign, both eyes             Throat:   Lips, mucosa, and tongue normal; teeth and gums normal  Neck:   Supple, symmetrical, trachea midline, no adenopathy;       thyroid:  No enlargement/tenderness/nodules; no carotid   bruit or JVD  Back:     Symmetric, no curvature, ROM normal, no CVA tenderness  Lungs:     Clear to auscultation bilaterally, respirations unlabored  Chest wall:    No tenderness or deformity  Heart:    Regular rate and rhythm, S1 and S2 normal, no murmur, rub   or gallop  Abdomen:     Soft, non-tender, bowel sounds active all four quadrants,    no masses, no organomegaly        Extremities:   Extremities normal, atraumatic, no cyanosis or edema  Pulses:   2+ and symmetric all extremities  Skin:   Skin color, texture, turgor normal, no rashes or lesions   Lymph nodes:   Cervical, supraclavicular, and axillary nodes normal  Neurologic:   CNII-XII intact. Normal strength, sensation and reflexes      throughout    LABORATORY DATA:  No results found for this or any previous visit (from the past 48 hour(s)).  RADIOGRAPHY: No results found.     PATHOLOGY: None  ASSESSMENT/PLAN: [name] is a very pleasant 61 yo caucasian gentleman with erythrocytosis over the last few months.  He is a smoker having only stopped yesterday.  Blood smear and CBC/CMP reviewed with Dr. Myna HidalgoEnnever. No abnormality or evidence of malignancy noted.  JAK 2, epo and iron studies are pending. We will see what these show.  Follow-up pending results.   All questions were answered. The patient knows to call the clinic with any problems, questions or concerns. We can certainly see the patient much sooner if necessary.  The patient was discussed with and also seen by Dr. Myna HidalgoEnnever and he is in agreement with the aforementioned.   Eileen StanfordSarah Carter

## 2021-07-26 LAB — ERYTHROPOIETIN: Erythropoietin: 9.9 m[IU]/mL (ref 2.6–18.5)

## 2021-07-27 ENCOUNTER — Telehealth: Payer: Self-pay | Admitting: *Deleted

## 2021-07-27 NOTE — Telephone Encounter (Signed)
Per 07/25/21 los - TBD

## 2021-07-28 ENCOUNTER — Other Ambulatory Visit: Payer: Self-pay | Admitting: Internal Medicine

## 2021-07-28 ENCOUNTER — Encounter: Payer: Self-pay | Admitting: Internal Medicine

## 2021-08-02 LAB — JAK 2 V617F (GENPATH)

## 2021-08-03 ENCOUNTER — Telehealth: Payer: Self-pay | Admitting: Family

## 2021-08-03 NOTE — Telephone Encounter (Signed)
Left voicemail with call back number to go over recent lab work results.

## 2021-08-04 ENCOUNTER — Encounter: Payer: Self-pay | Admitting: Internal Medicine

## 2021-08-16 ENCOUNTER — Encounter: Payer: Self-pay | Admitting: Internal Medicine

## 2021-08-16 ENCOUNTER — Ambulatory Visit (INDEPENDENT_AMBULATORY_CARE_PROVIDER_SITE_OTHER): Payer: Managed Care, Other (non HMO) | Admitting: Internal Medicine

## 2021-08-16 VITALS — BP 134/80 | HR 69 | Temp 97.7°F | Resp 18 | Ht 72.0 in | Wt 225.4 lb

## 2021-08-16 DIAGNOSIS — E1165 Type 2 diabetes mellitus with hyperglycemia: Secondary | ICD-10-CM

## 2021-08-16 DIAGNOSIS — Z125 Encounter for screening for malignant neoplasm of prostate: Secondary | ICD-10-CM | POA: Diagnosis not present

## 2021-08-16 DIAGNOSIS — E785 Hyperlipidemia, unspecified: Secondary | ICD-10-CM

## 2021-08-16 DIAGNOSIS — Z794 Long term (current) use of insulin: Secondary | ICD-10-CM | POA: Diagnosis not present

## 2021-08-16 DIAGNOSIS — Z23 Encounter for immunization: Secondary | ICD-10-CM

## 2021-08-16 DIAGNOSIS — E1169 Type 2 diabetes mellitus with other specified complication: Secondary | ICD-10-CM

## 2021-08-16 DIAGNOSIS — I10 Essential (primary) hypertension: Secondary | ICD-10-CM

## 2021-08-16 DIAGNOSIS — E039 Hypothyroidism, unspecified: Secondary | ICD-10-CM | POA: Diagnosis not present

## 2021-08-16 DIAGNOSIS — Z0000 Encounter for general adult medical examination without abnormal findings: Secondary | ICD-10-CM | POA: Diagnosis not present

## 2021-08-16 LAB — LIPID PANEL
Cholesterol: 139 mg/dL (ref 0–200)
HDL: 49.9 mg/dL (ref >39.00)
LDL Cholesterol: 68 mg/dL (ref 0–99)
NonHDL: 89.42
Total CHOL/HDL Ratio: 3
Triglycerides: 106 mg/dL (ref 0.0–149.0)
VLDL: 21.2 mg/dL (ref 0.0–40.0)

## 2021-08-16 LAB — PSA: PSA: 1.53 ng/mL (ref 0.10–4.00)

## 2021-08-16 LAB — T4, FREE: Free T4: 1.6 ng/dL (ref 0.60–1.60)

## 2021-08-16 LAB — HEMOGLOBIN A1C: Hgb A1c MFr Bld: 7.3 % — ABNORMAL HIGH (ref 4.6–6.5)

## 2021-08-16 LAB — TSH: TSH: 0.43 u[IU]/mL (ref 0.35–5.50)

## 2021-08-16 NOTE — Progress Notes (Signed)
Subjective:    Patient ID: Alex Phillips, male    DOB: [birth year], 61 y.o.   MRN: [id number]  DOS:  08/16/2021 Type of visit - description: CPX  Since the last office visit, he saw hematology and nephrology.  Notes reviewed. Medications were adjusted due to elevated BP, BP today is better. Continue with right shoulder pain.  Review of Systems  Other than above, a 14 point review of systems is negative   Past Medical History:  Diagnosis Date   Bell's palsy    Diabetes mellitus 11/09   A1c- 6.2   DJD (degenerative joint disease)    l knee   EKG, abnormal 12-2007   Stress test (-)    Elevated PSA 2010   saw urology, PSA was reched and found to be normal. Bx was canceled, did get a u/s d/t michrohematuris- normal   Elevated WBC count    saw Dr.Enenever; likely benign, monitor   HTN (hypertension) 11/14/2006   Hyperlipidemia    Hypertension    Hypothyroidism     Past Surgical History:  Procedure Laterality Date   MOUTH SURGERY     crown extention on right upper    TONSILLECTOMY AND ADENOIDECTOMY     TYMPANOSTOMY TUBE PLACEMENT     B as a child   VASECTOMY  02/2009    Social History   Socioeconomic History   Marital status: Married    Spouse name: Not on file   Number of children: 0   Years of education: Not on file   Highest education level: Not on file  Occupational History   Occupation: RETIRED Government social research officer   Occupation: Retired 03-2018  Tobacco Use   Smoking status: Former    Packs/day: 1.50    Years: 34.00    Pack years: 51.00    Types: Cigarettes    Start date: 08/30/1981    Quit date: 07/24/2021    Years since quitting: 0.0   Smokeless tobacco: Never  Vaping Use   Vaping Use: Never used  Substance and Sexual Activity   Alcohol use: No    Alcohol/week: 0.0 standard drinks    Comment: rare   Drug use: No   Sexual activity: Not on file  Other Topics Concern   Not on file  Social History Narrative   Household: wife, 5 step kids  total , 2 at home  (one is Down syndrome)       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    Works in Data processing manager for warehouses, 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[employer], [place]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   Social Determinants of Health   Financial Resource Strain: Not on file  Food Insecurity: Not on file  Transportation Needs: Not on file  Physical Activity: Not on file  Stress: Not on file  Social Connections: Not on file  Intimate Partner Violence: Not on file     Current Outpatient Medications  Medication Instructions   amLODipine (NORVASC) 5 mg, Oral, Daily   aspirin 81 mg, Oral, Daily,     carvedilol (COREG) 25 MG tablet TAKE 1 TABLET(25 MG) BY MOUTH TWICE DAILY WITH A MEAL   FARXIGA 10 MG TABS tablet TAKE 1 TABLET(10 MG) BY MOUTH DAILY BEFORE BREAKFAST   levothyroxine (SYNTHROID) 150 MCG tablet TAKE 1 TABLET(150 MCG) BY MOUTH DAILY BEFORE AND BREAKFAST   losartan (COZAAR) 100 mg, Oral, Daily   magnesium oxide (MAG-OX) 400 mg, Oral, Daily   nitroGLYCERIN (NITROSTAT) 0.4 mg, Sublingual, Every 5 min PRN  pioglitazone (ACTOS) 30 mg, Oral, Daily   rosuvastatin (CRESTOR) 40 MG tablet TAKE 1 TABLET BY MOUTH EVERY DAY       Objective:   Physical Exam BP 134/80 (BP Location: Left Arm, Patient Position: Sitting, Cuff Size: Small)    Pulse 69    Temp 97.7 F (36.5 C) (Oral)    Resp 18    Ht 6' (1.829 m)    Wt 225 lb 6 oz (102.2 kg)    SpO2 95%    BMI 30.57 kg/m  General: Well developed, NAD, BMI noted Neck: No  thyromegaly  HEENT:  Normocephalic . Face symmetric, atraumatic Lungs:  CTA B Normal respiratory effort, no intercostal retractions, no accessory muscle use. Heart: RRR,  no murmur.  Abdomen:  Not distended, soft, non-tender. No rebound or rigidity.   Lower extremities: no pretibial edema bilaterally  Skin: Exposed areas without rash. Not pale. Not jaundice DRE: Brown stools, normal sphincter tone, prostate not tender or nodular. Neurologic:  alert & oriented X3.  Speech normal,  gait appropriate for age and unassisted Strength symmetric and appropriate for age.  Psych: Cognition and judgment appear intact.  Cooperative with normal attention span and concentration.  Behavior appropriate. No anxious or depressed appearing.     Assessment     Assessment  DM HTN Hyperlipidemia Hypothyroidism DJD Bell's palsy Hematology: --Leukocytosis, benign per Hem, monitoring --Erythrocytosis, seen by hematology 2023 Coronary calcifications, abnormal stress test 01/20/2019.  See cardiology note 01-2019 Tobacco  Abuse  CT lung ca screening:  Coronary artery calcifications. Aortic Atherosclerosis. Emphysema (ICD10-J43.9).  PLAN: Here for CPX DM, last A1c 8.0, Actos was added, he also takes [place].  Checking labs HTN: Recently nephrology increase losartan from 50 mg to 100 mg, follow-up blood work after the change normal per pt.  BP today is better.  No change, continue amlodipine, carvedilol, losartan. Hypothyroidism: Check TSH DJD: Still having right shoulder pain, previously taking NSAIDs, now Tylenol as recommended by me and nephrology to protect kidney function.  Thinking about proceed with shoulder surgery. Erythrocytosis: Referred to hematology by nephrology.  Note reviewed. Tobacco abuse: He stopped tobacco,praised  RTC 4 months    This visit occurred during the SARS-CoV-2 public health emergency.  Safety protocols were in place, including screening questions prior to the visit, additional usage of staff PPE, and extensive cleaning of exam room while observing appropriate contact time as indicated for disinfecting solutions.

## 2021-08-16 NOTE — Patient Instructions (Addendum)
Per our records you are due for your diabetic eye exam. Please contact your eye doctor to schedule an appointment. Please have them send copies of your office visit notes to [place]. Our fax number is [phone]. If you need a referral to an eye doctor please let us know.    Check the  blood pressure regularly BP GOAL is between 110/65 and  135/85. If it is consistently higher or lower, let me know  Diabetes: Check your blood sugar at different times  - early in AM fasting  ( goal 70-130) - 2 hours after a meal (goal less than 180   GO TO THE LAB : Get the blood work     GO TO THE FRONT DESK, PLEASE SCHEDULE YOUR APPOINTMENTS Come back for   a checkup in 4 months    "Living will", "Health Care Power of attorney": Advanced care planning  (If you already have a living will or healthcare power of attorney, please bring the copy to be scanned in your chart.)  Advance care planning is a process that supports adults in  understanding and sharing their preferences regarding future medical care.   The patient's preferences are recorded in documents called Advance Directives.    Advanced directives are completed (and can be modified at any time) while the patient is in full mental capacity.   The documentation should be available at all times to the patient, the family and the healthcare providers.  Bring in a copy to be scanned in your chart is an excellent idea and is recommended   This legal documents direct treatment decision making and/or appoint a surrogate to make the decision if the patient is not capable to do so.    Advance directives can be documented in many types of formats,  documents have names such as:  Lliving will  Durable power of attorney for healthcare (healthcare proxy or healthcare power of attorney)  Combined directives  Physician orders for life-sustaining treatment    More information at:  [email or web address]

## 2021-08-18 ENCOUNTER — Encounter: Payer: Self-pay | Admitting: Internal Medicine

## 2021-08-18 NOTE — Assessment & Plan Note (Signed)
Here for CPX DM, last A1c 8.0, Actos was added, he also takes [place].  Checking labs HTN: Recently nephrology increase losartan from 50 mg to 100 mg, follow-up blood work after the change normal per pt.  BP today is better.  No change, continue amlodipine, carvedilol, losartan. Hypothyroidism: Check TSH DJD: Still having right shoulder pain, previously taking NSAIDs, now Tylenol as recommended by me and nephrology to protect kidney function.  Thinking about proceed with shoulder surgery. Erythrocytosis: Referred to hematology by nephrology.  Note reviewed. Tobacco abuse: He stopped tobacco,praised  RTC 4 months

## 2021-08-18 NOTE — Assessment & Plan Note (Signed)
--  Tdap 2015 -prevnar- 2016 - PNM 23:  2014 - shingrex: First dose today, next in 4 months - covid vaccines , flu shots: declined (consistently) -- CCS 12-2003: Cscope , TICs, hyperplastic polyps  08-2013 Cscope no polyps, 10 years --DRE today normal, check a PSA. --Diet exercise: Counseled -Tobacco abuse: quit recently, praised! - lung cancer screening: Next CT due 05-2022 -Labs: FLP, A1c, TSH, free T4 PSA - ACP  info provided

## 2021-08-22 ENCOUNTER — Telehealth: Payer: Self-pay

## 2021-08-22 NOTE — Telephone Encounter (Signed)
-----   Message from Erenest Blank, NP sent at 08/19/2021  4:04 PM EST ----- I've tried calling him and leaving voicemail. To my knowledge he has not called back. His JAK 2 showed a variant that is not of any clinical significance. I ran it by Dr. Myna Hidalgo and it is not connected to any bone marrow issue such and leukemia or lymphoma. No follow-up needed at this time. We can certainly see him again for any future heme onc issues. Thank you!   ----- Message ----- From: Interface, [facility] [place] Sent: 07/25/2021   9:09 AM EST To: Erenest Blank, NP

## 2021-08-22 NOTE — Telephone Encounter (Signed)
Called and LM informed patient of lab results (ok per DPR). Requested a CB with any questions or concerns.

## 2021-09-10 ENCOUNTER — Other Ambulatory Visit: Payer: Self-pay | Admitting: Internal Medicine

## 2021-10-06 ENCOUNTER — Other Ambulatory Visit: Payer: Self-pay | Admitting: Internal Medicine

## 2021-11-10 LAB — CBC: RBC: 4.85 (ref 3.87–5.11)

## 2021-11-10 LAB — COMPREHENSIVE METABOLIC PANEL
Albumin: 4.5 (ref 3.5–5.0)
Calcium: 9.4 (ref 8.7–10.7)
eGFR: 47

## 2021-11-10 LAB — BASIC METABOLIC PANEL
BUN: 17 (ref 4–21)
CO2: 21 (ref 13–22)
Chloride: 104 (ref 99–108)
Creatinine: 1.7 — AB (ref 0.6–1.3)
Glucose: 149
Potassium: 4.5 mEq/L (ref 3.5–5.1)
Sodium: 144 (ref 137–147)

## 2021-11-10 LAB — CBC AND DIFFERENTIAL
HCT: 47 (ref 41–53)
Hemoglobin: 15.8 (ref 13.5–17.5)
Neutrophils Absolute: 5.4
Platelets: 157 10*3/uL (ref 150–400)
WBC: 8.8

## 2021-11-10 LAB — PROTEIN / CREATININE RATIO, URINE: Albumin, U: 254

## 2021-11-24 ENCOUNTER — Encounter: Payer: Self-pay | Admitting: Internal Medicine

## 2021-12-14 ENCOUNTER — Ambulatory Visit: Payer: Managed Care, Other (non HMO) | Admitting: Internal Medicine

## 2021-12-14 ENCOUNTER — Encounter: Payer: Self-pay | Admitting: Internal Medicine

## 2022-01-03 ENCOUNTER — Encounter: Payer: Self-pay | Admitting: Internal Medicine

## 2022-01-03 ENCOUNTER — Ambulatory Visit: Payer: Commercial Managed Care - HMO | Admitting: Internal Medicine

## 2022-01-03 VITALS — BP 116/72 | HR 74 | Temp 98.2°F | Resp 16 | Ht 72.0 in | Wt 226.4 lb

## 2022-01-03 DIAGNOSIS — N1831 Chronic kidney disease, stage 3a: Secondary | ICD-10-CM | POA: Diagnosis not present

## 2022-01-03 DIAGNOSIS — Z23 Encounter for immunization: Secondary | ICD-10-CM

## 2022-01-03 DIAGNOSIS — E039 Hypothyroidism, unspecified: Secondary | ICD-10-CM | POA: Diagnosis not present

## 2022-01-03 DIAGNOSIS — E1165 Type 2 diabetes mellitus with hyperglycemia: Secondary | ICD-10-CM

## 2022-01-03 DIAGNOSIS — Z794 Long term (current) use of insulin: Secondary | ICD-10-CM

## 2022-01-03 NOTE — Assessment & Plan Note (Addendum)
Routine follow-up DM: On Farxiga and pioglitazone, last A1c improving, recheck it.  Encouraged healthier diet, unable to exercise, still having issues with shoulder pain. HTN: BP is great, continue amlodipine, carvedilol, losartan, kerendia.  Recent BMP and nephrology.  No change CKD: Saw renal last month, Chauncey Mann was added for renal protection, pt reports that subsequently had a BMP with a slightly elevated potassium. Hypothyroidism: On Synthroid, lab Erythrocytosis: Saw hematology, JAK2 showed a variant of no clinical significance, f/u prn Preventive care:  Declines COVID and flu vaccines Shingrix No. 2 today RTC 4 m

## 2022-01-03 NOTE — Progress Notes (Signed)
Subjective:    Patient ID: Alex Phillips, male    DOB: [birth year], 61 y.o.   MRN: [id number]  DOS:  01/03/2022 Type of visit - description: f/u  Since the last office visit is doing well. Weight gain noted, he is not exercising regularly. No recent ambulatory BPs or CBGs.  Last visit with nephrology last month, note reviewed.  Wt Readings from Last 3 Encounters:  01/03/22 226 lb 6 oz (102.7 kg)  08/16/21 225 lb 6 oz (102.2 kg)  07/25/21 220 lb 12.8 oz (100.2 kg)     Review of Systems See above   Past Medical History:  Diagnosis Date   Bell's palsy    Diabetes mellitus 11/09   A1c- 6.2   DJD (degenerative joint disease)    l knee   EKG, abnormal 12-2007   Stress test (-)    Elevated PSA 2010   saw urology, PSA was reched and found to be normal. Bx was canceled, did get a u/s d/t michrohematuris- normal   Elevated WBC count    saw Dr.Enenever; likely benign, monitor   HTN (hypertension) 11/14/2006   Hyperlipidemia    Hypertension    Hypothyroidism     Past Surgical History:  Procedure Laterality Date   MOUTH SURGERY     crown extention on right upper    TONSILLECTOMY AND ADENOIDECTOMY     TYMPANOSTOMY TUBE PLACEMENT     B as a child   VASECTOMY  02/2009    Current Outpatient Medications  Medication Instructions   amLODipine (NORVASC) 5 mg, Oral, Daily   aspirin 81 mg, Oral, Daily,     carvedilol (COREG) 25 MG tablet TAKE 1 TABLET(25 MG) BY MOUTH TWICE DAILY WITH A MEAL   FARXIGA 10 MG TABS tablet TAKE 1 TABLET(10 MG) BY MOUTH DAILY BEFORE BREAKFAST   KERENDIA 10 MG TABS 1 tablet, Oral, Daily   levothyroxine (SYNTHROID) 150 MCG tablet TAKE 1 TABLET(150 MCG) BY MOUTH DAILY BEFORE AND BREAKFAST   losartan (COZAAR) 100 mg, Oral, Daily   magnesium oxide (MAG-OX) 400 mg, Oral, Daily   nitroGLYCERIN (NITROSTAT) 0.4 mg, Sublingual, Every 5 min PRN   pioglitazone (ACTOS) 30 MG tablet TAKE 1 TABLET(30 MG) BY MOUTH DAILY   rosuvastatin (CRESTOR) 40 MG tablet TAKE 1  TABLET BY MOUTH EVERY DAY       Objective:   Physical Exam BP 116/72   Pulse 74   Temp 98.2 F (36.8 C) (Oral)   Resp 16   Ht 6' (1.829 m)   Wt 226 lb 6 oz (102.7 kg)   SpO2 97%   BMI 30.70 kg/m  General:   Well developed, NAD, BMI noted. HEENT:  Normocephalic . Face symmetric, atraumatic Lungs:  CTA B Normal respiratory effort, no intercostal retractions, no accessory muscle use. Heart: RRR,  no murmur.  Lower extremities: no pretibial edema bilaterally  Skin: Not pale. Not jaundice Neurologic:  alert & oriented X3.  Speech normal, gait appropriate for age and unassisted Psych--  Cognition and judgment appear intact.  Cooperative with normal attention span and concentration.  Behavior appropriate. No anxious or depressed appearing.      Assessment     Assessment  DM HTN Hyperlipidemia Hypothyroidism CKD DJD Bell's palsy Hematology: --Leukocytosis, benign per Hem, monitoring --Erythrocytosis, seen by hematology 2023, f/u prn Coronary calcifications, abnormal stress test 01/20/2019.  See cardiology note 01-2019 Tobacco  Abuse  CT lung ca screening:  Coronary artery calcifications. Aortic Atherosclerosis. Emphysema (ICD10-J43.9).  PLAN: Routine  follow-up DM: On Farxiga and pioglitazone, last A1c improving, recheck it.  Encouraged healthier diet, unable to exercise, still having issues with shoulder pain. HTN: BP is great, continue amlodipine, carvedilol, losartan, kerendia.  Recent BMP and nephrology.  No change CKD: Saw renal last month, Chauncey Mann was added for renal protection, pt reports that subsequently had a BMP with a slightly elevated potassium. Hypothyroidism: On Synthroid, lab Erythrocytosis: Saw hematology, JAK2 showed a variant of no clinical significance, f/u prn Preventive care:  Declines COVID and flu vaccines Shingrix No. 2 today RTC 4 m    v

## 2022-01-03 NOTE — Patient Instructions (Signed)
Check the  blood pressure regularly BP GOAL is between 110/65 and  135/85. If it is consistently higher or lower, let me know  Diabetes: Check your blood sugar at different times  - early in AM fasting  ( goal 70-130) - 2 hours after a meal (goal less than 180)     GO TO THE LAB : Get the blood work     GO TO THE FRONT DESK, PLEASE SCHEDULE YOUR APPOINTMENTS Come back for a checkup in 4 months

## 2022-01-04 LAB — TSH: TSH: 2.21 u[IU]/mL (ref 0.35–5.50)

## 2022-01-04 LAB — HEMOGLOBIN A1C: Hgb A1c MFr Bld: 7.4 % — ABNORMAL HIGH (ref 4.6–6.5)

## 2022-01-06 MED ORDER — ACARBOSE 25 MG PO TABS: 25.0000 mg | ORAL_TABLET | Freq: Three times a day (TID) | ORAL | 6 refills | Status: DC

## 2022-01-06 NOTE — Addendum Note (Signed)
Addended byConrad Vega Alta D on: 01/06/2022 12:54 PM   Modules accepted: Orders

## 2022-01-09 ENCOUNTER — Other Ambulatory Visit: Payer: Self-pay | Admitting: Internal Medicine

## 2022-04-06 ENCOUNTER — Other Ambulatory Visit: Payer: Self-pay | Admitting: Internal Medicine

## 2022-04-11 ENCOUNTER — Other Ambulatory Visit: Payer: Self-pay | Admitting: Internal Medicine

## 2022-04-23 ENCOUNTER — Other Ambulatory Visit: Payer: Self-pay | Admitting: Internal Medicine

## 2022-05-08 ENCOUNTER — Encounter: Payer: Self-pay | Admitting: Internal Medicine

## 2022-05-08 ENCOUNTER — Ambulatory Visit: Payer: Commercial Managed Care - HMO | Admitting: Internal Medicine

## 2022-05-08 VITALS — BP 126/72 | HR 65 | Temp 97.7°F | Resp 18 | Ht 72.0 in | Wt 232.0 lb

## 2022-05-08 DIAGNOSIS — Z794 Long term (current) use of insulin: Secondary | ICD-10-CM | POA: Diagnosis not present

## 2022-05-08 DIAGNOSIS — I2510 Atherosclerotic heart disease of native coronary artery without angina pectoris: Secondary | ICD-10-CM | POA: Diagnosis not present

## 2022-05-08 DIAGNOSIS — E1165 Type 2 diabetes mellitus with hyperglycemia: Secondary | ICD-10-CM

## 2022-05-08 DIAGNOSIS — I10 Essential (primary) hypertension: Secondary | ICD-10-CM | POA: Diagnosis not present

## 2022-05-08 LAB — BASIC METABOLIC PANEL
BUN: 28 mg/dL — ABNORMAL HIGH (ref 6–23)
CO2: 26 mEq/L (ref 19–32)
Calcium: 9.2 mg/dL (ref 8.4–10.5)
Chloride: 106 mEq/L (ref 96–112)
Creatinine, Ser: 1.77 mg/dL — ABNORMAL HIGH (ref 0.40–1.50)
GFR: 41.11 mL/min — ABNORMAL LOW (ref >60.00)
Glucose, Bld: 127 mg/dL — ABNORMAL HIGH (ref 70–99)
Potassium: 4.4 mEq/L (ref 3.5–5.1)
Sodium: 140 mEq/L (ref 135–145)

## 2022-05-08 LAB — CBC WITH DIFFERENTIAL/PLATELET
Basophils Absolute: 0.1 10*3/uL (ref 0.0–0.1)
Basophils Relative: 1.3 % (ref 0.0–3.0)
Eosinophils Absolute: 0.2 10*3/uL (ref 0.0–0.7)
Eosinophils Relative: 2.1 % (ref 0.0–5.0)
HCT: 44.9 % (ref 39.0–52.0)
Hemoglobin: 15.2 g/dL (ref 13.0–17.0)
Lymphocytes Relative: 27.3 % (ref 12.0–46.0)
Lymphs Abs: 2.3 10*3/uL (ref 0.7–4.0)
MCHC: 33.9 g/dL (ref 30.0–36.0)
MCV: 98.4 fl (ref 78.0–100.0)
Monocytes Absolute: 0.9 10*3/uL (ref 0.1–1.0)
Monocytes Relative: 10.4 % (ref 3.0–12.0)
Neutro Abs: 5.1 10*3/uL (ref 1.4–7.7)
Neutrophils Relative %: 58.9 % (ref 43.0–77.0)
Platelets: 132 10*3/uL — ABNORMAL LOW (ref 150.0–400.0)
RBC: 4.57 Mil/uL (ref 4.22–5.81)
RDW: 13.8 % (ref 11.5–15.5)
WBC: 8.6 10*3/uL (ref 4.0–10.5)

## 2022-05-08 LAB — HEMOGLOBIN A1C: Hgb A1c MFr Bld: 7.4 % — ABNORMAL HIGH (ref 4.6–6.5)

## 2022-05-08 NOTE — Assessment & Plan Note (Signed)
DM: Last A1c was 7.4, on Farxiga, Actos.  Acarbose added had  no major impact on a CBGs and significant side effects mostly flatulence. Feet exam negative Plan: Check A1c, if A1c is much better, I will rec  to continue a carbose despite s/e although pt states he likes to d/c acarbose.  Further advised with results. HTN: Seems very good, continue amlodipine, carvedilol, losartan.  Check BMP COPD: Former smoker, O2 sat upon arrival 94%, recheck 95%.  Will need PFTs at some point, see next CAD: Abnormal stress test 12-2018, ischemic changes noted, at that time declined further eval.  He does not exercise regularly thus lack of sxs does not guarantee stable CAD. EKG today: sinus rhythm, questionable TWI V5 V6. Plan: Refer to cardiology.  Further eval? Preventive care: Declined COVID or flu vaccines RTC 1 month CPX

## 2022-05-08 NOTE — Patient Instructions (Addendum)
Per our records you are soon due for your diabetic eye exam. Please contact your eye doctor to schedule an appointment. Please have them send copies of your office visit notes to [place]. Our fax number is [phone]. If you need a referral to an eye doctor please let us know.   Check the  blood pressure regularly BP GOAL is between 110/65 and  135/85. If it is consistently higher or lower, let me know  We are referring you to cardiology  GO TO THE LAB : Get the blood work     GO TO THE FRONT DESK, PLEASE SCHEDULE YOUR APPOINTMENTS Come back for   a physical exam in 4 months

## 2022-05-08 NOTE — Progress Notes (Signed)
Subjective:    Patient ID: Alex Phillips, male    DOB: 26-Nov-1960, 61 y.o.   MRN: [id number]  DOS:  05/08/2022 Type of visit - description: f/u Chronic medical problems were assessed. O2 sat was noted to be 94%, he reports not DOE, no major cough.  No chest pain. He admits to sedentary lifestyle. Taking acarbose for DM, has significant side effectss   Review of Systems See above   Past Medical History:  Diagnosis Date   Bell's palsy    Diabetes mellitus 11/09   A1c- 6.2   DJD (degenerative joint disease)    l knee   EKG, abnormal 12-2007   Stress test (-)    Elevated PSA 2010   saw urology, PSA was reched and found to be normal. Bx was canceled, did get a u/s d/t michrohematuris- normal   Elevated WBC count    saw Dr.Enenever; likely benign, monitor   HTN (hypertension) 11/14/2006   Hyperlipidemia    Hypertension    Hypothyroidism     Past Surgical History:  Procedure Laterality Date   MOUTH SURGERY     crown extention on right upper    TONSILLECTOMY AND ADENOIDECTOMY     TYMPANOSTOMY TUBE PLACEMENT     B as a child   VASECTOMY  02/2009    Current Outpatient Medications  Medication Instructions   acarbose (PRECOSE) 25 mg, Oral, 3 times daily with meals   amLODipine (NORVASC) 5 mg, Oral, Daily   aspirin 81 mg, Oral, Daily,     carvedilol (COREG) 25 mg, Oral, 2 times daily with meals   dapagliflozin propanediol (FARXIGA) 10 mg, Oral, Daily   KERENDIA 10 MG TABS 1 tablet, Oral, Daily   levothyroxine (SYNTHROID) 150 mcg, Oral, Daily before breakfast   losartan (COZAAR) 100 mg, Oral, Daily   magnesium oxide (MAG-OX) 400 mg, Oral, Daily   nitroGLYCERIN (NITROSTAT) 0.4 mg, Sublingual, Every 5 min PRN   pioglitazone (ACTOS) 30 mg, Oral, Daily   rosuvastatin (CRESTOR) 40 mg, Oral, Daily       Objective:   Physical Exam BP 126/72   Pulse 65   Temp 97.7 F (36.5 C) (Oral)   Resp 18   Ht 6' (1.829 m)   Wt 232 lb (105.2 kg)   SpO2 95%   BMI 31.46 kg/m   General:   Well developed, NAD, BMI noted. HEENT:  Normocephalic . Face symmetric, atraumatic Lungs:  CTA B Normal respiratory effort, no intercostal retractions, no accessory muscle use. Heart: RRR,  no murmur.  DM foot exam: No edema, pedal pulses present, pinprick examination normal Skin: Not pale. Not jaundice Neurologic:  alert & oriented X3.  Speech normal, gait appropriate for age and unassisted Psych--  Cognition and judgment appear intact.  Cooperative with normal attention span and concentration.  Behavior appropriate. No anxious or depressed appearing.      Assessment     Assessment  DM: - Avoid metformin (CKD) - h/o excessive weight loss with Victoza HTN Hyperlipidemia Hypothyroidism CAD:  --cor Ca+ per CTCoronary calcifications,  stress test 01/20/2019: ischemic changes , declined  CTA coronary --See cardiology note 01-2019 COPD: per CT, former heavy smoker  CKD DJD Bell's palsy Hematology: --Leukocytosis, benign per Hem, monitoring --Erythrocytosis, JAK2 = variant of no clinical , per hematology 2023, f/u prn Quit Tobacco  06-2021    PLAN: DM: Last A1c was 7.4, on Farxiga, Actos.  Acarbose added had  no major impact on a CBGs and significant side effects  mostly flatulence. Feet exam negative Plan: Check A1c, if A1c is much better, I will rec  to continue a carbose despite s/e although pt states he likes to d/c acarbose.  Further advised with results. HTN: Seems very good, continue amlodipine, carvedilol, losartan.  Check BMP COPD: Former smoker, O2 sat upon arrival 94%, recheck 95%.  Will need PFTs at some point, see next CAD: Abnormal stress test 12-2018, ischemic changes noted, at that time declined further eval.  He does not exercise regularly thus lack of sxs does not guarantee stable CAD. EKG today: sinus rhythm, questionable TWI V5 V6. Plan: Refer to cardiology.  Further eval? Preventive care: Declined COVID or flu vaccines RTC 1 month CPX

## 2022-05-10 ENCOUNTER — Telehealth: Payer: Self-pay

## 2022-05-10 ENCOUNTER — Other Ambulatory Visit: Payer: Self-pay | Admitting: Internal Medicine

## 2022-05-10 ENCOUNTER — Telehealth: Payer: Self-pay | Admitting: Internal Medicine

## 2022-05-10 MED ORDER — GLIPIZIDE 2.5 MG PO TABS: 2.0000 | ORAL_TABLET | Freq: Every morning | ORAL | 3 refills | Status: DC

## 2022-05-10 NOTE — Telephone Encounter (Signed)
Needs better diabetes control. On acarbose but have multiple side effects.  History of rash with Januvia. Call patient: - stop a carbose  - start glipizide 2.5 mg 1 tablet in the morning for a week, if no excessive low blood sugar (<100) then take 2 tablets in the morning.

## 2022-05-10 NOTE — Telephone Encounter (Signed)
LMOM asking for call back. Acarbose d/c, glipizide 2.5mg  sent to pharmacy.

## 2022-05-10 NOTE — Telephone Encounter (Signed)
[facility] unable to get glipizide 2.5mg , only 5mg  or 10mg . They do however have glipizide XL 2mg - would sig still be the same?

## 2022-05-10 NOTE — Telephone Encounter (Signed)
Recommendations sent via Mychart.

## 2022-05-11 MED ORDER — GLIPIZIDE ER 2.5 MG PO TB24: ORAL_TABLET | ORAL | 0 refills | Status: DC

## 2022-05-11 NOTE — Telephone Encounter (Signed)
Rx sent 

## 2022-05-11 NOTE — Telephone Encounter (Signed)
Okay to switch to extended release 2.5 mg

## 2022-05-11 NOTE — Telephone Encounter (Signed)
Letter mailed to Pt asking him to call regarding lab results or log into mychart to view recommendations.

## 2022-05-12 LAB — COMPREHENSIVE METABOLIC PANEL
Albumin: 4.7 (ref 3.5–5.0)
Calcium: 9.4 (ref 8.7–10.7)
eGFR: 49

## 2022-05-12 LAB — BASIC METABOLIC PANEL
BUN: 18 (ref 4–21)
CO2: 24 — AB (ref 13–22)
Chloride: 105 (ref 99–108)
Creatinine: 1.6 — AB (ref 0.6–1.3)
Glucose: 149
Potassium: 4.7 mEq/L (ref 3.5–5.1)
Sodium: 143 (ref 137–147)

## 2022-05-12 LAB — CBC AND DIFFERENTIAL: Hemoglobin: 14.8 (ref 13.5–17.5)

## 2022-05-12 LAB — PROTEIN / CREATININE RATIO, URINE
Albumin, U: 116.9
Creatinine, Urine: 63.3

## 2022-05-15 NOTE — Telephone Encounter (Signed)
Last read by [name] at  2:29 PM on 05/14/2022.

## 2022-05-16 NOTE — Progress Notes (Signed)
Primary Care Provider: Wanda Plump, MD [facility] Cardiologist: Bryan Lemma, MD Electrophysiologist: None  Clinic Note: Chief Complaint  Patient presents with   New Patient (Initial Visit)    Establish cardiology care.  Coronary calcium on CT, abnormal nuclear stress test   ===================================  ASSESSMENT/PLAN   Problem List Items Addressed This Visit       Cardiology Problems   Coronary artery calcification seen on computed tomography - Primary (Chronic)    61 year old gentleman with hypertension, hyperlipidemia and diabetes who is a former smoker.  Not unlikely to have coronary calcium on CT scan.  Previously evaluated with nuclear stress test that suggested possible apical inferior ischemia.  Interestingly there is normal wall motion in that area on both Myoview and echo.  At the time of the visual evaluation 2020 he was asymptomatic, and because he was upset about having had a nuclear stress test instead of just a treadmill stress test ordered, he was lost to follow-up. He opted out of having any further evaluation by [facility], after having a relatively normal echocardiogram.  The plan had been to consider Coronary CTA.  At this point, he is completely asymptomatic as far as any chest pain or pressure is concerned.  We talked about management going forward and essentially the recommended treatment for having an elevated Coronary Calcium Score will be absent symptoms will be to continue aggressive risk factor modification.  Also, elevated Coronary Calcium Score would warrant the addition of aspirin.  Presently, he is already on aspirin, high-dose beta-blocker high-dose ARB and high-dose calcium channel blocker along with high-dose statin.  These are all medications that would appropriately be used to treat an asymptomatic patient with coronary disease. His A1c was 7.4, and being addressed by his PCP (may consider switching from  glipizide for GLP-1 agonist)  Lipids are well controlled and LDL already less than 70 which would be the goal for CAD. BP is well controlled albeit on multiple medications. He has quit smoking, and energy level is picking up. Discussed importance of weight loss and increasing exercise. Major educational information today was discussed the pathophysiology of atherosclerotic disease and plaque burden.   We talked about whether a Coronary CT Angiogram or Coronary calcium score would beneficial, but truthfully Coronary Calcium Score would not necessarily change her management, nor would it Coronary CTA be indicated in the absence of symptoms. If he were to have concerning symptoms of exertional dyspnea or chest tightness or pressure, then certainly Coronary CTA would be indicated.      Essential hypertension (Chronic)    BP well controlled on current medicines.  He is on multiple medications albeit no diuretic.  If he were to have additional BP med requirement, would recommend thiazide diuretic.  Otherwise he is already on max dose amlodipine, carvedilol and losartan.      Hyperlipidemia associated with type 2 diabetes mellitus (HCC) (Chronic)    On high-dose rosuvastatin with excellent lipid control.  HDL almost 50, and LDL 68.  Triglycerides 106. => With continued weight loss/diet and exercise, this should continue to improve.  A1c 7.4.  This is the only remaining risk factor chronic and under control. He is on Farxiga glipizide and pioglitazone,--would consider GLP-1 agonist that would help with weight loss and improve glycemic control.  With good parents for underlying cardiac disease, would recommend changing from pioglitazone to different medication.      Relevant Orders   EKG 12-Lead  Other   Former heavy tobacco smoker    After longtime smoking, he finally quit smoking this January.  Doing well. Congratulated his efforts.      CKD (chronic kidney disease) stage 3, GFR 30-59  ml/min (HCC) (Chronic)    Most recent creatinine was 1.77.  Followed by nephrology.  Recently started on Karendia.      Relevant Orders   EKG 12-Lead   ===================================  HPI:    Alex Phillips is a 61 y.o. male current smoker (greater than 20-Pack Year, unsuccessfully quit-restarted after 1 year) with PMH notable for HTN, HLD and DM-2 who is being seen today to reestablish cardiology care, at the request of Wanda Plump, MD.  Alex Phillips was seen by Dr. Tomie China on 01/28/2019 to discuss results of nuclear stress test suggesting apical inferior infarct with peri-infarct ischemia.  Patient denied any dyspnea on exertion he leads a sedentary lifestyle.  Given option of Cardiac Catheterization versus Coronary CTA.  Refused nitroglycerin, and requested a second opinion from a different group.  Noted elevated blood pressure.  He was referred by Dr. Porfirio Oar to Dr. Desma Maxim in [facility] for second opinion.  EKG noted occasional PVCs with nonspecific ST-T wave changes.  Noted that family medical history not known-patient adopted.  Noted to hypertension and hyperlipidemia as well is diabetes with documented coronary disease on CT scans.  No angina or exertional dyspnea noted.  However 2D echo ordered.  But felt that with low risk stress test, heart cath not recommended -Especially in light of no ongoing symptoms. => he was started on magnesium oxide 40 mg daily for 2 months with an echocardiogram and 43-month follow-up. => Never followed up, neither with the echocardiogram or consultation.  Recent Hospitalizations: None  Was just seen by Dr. Drue Novel on 05/08/2022.  On Farxiga at acarbose and Actos used for diabetes.  Amlodipine, carvedilol and losartan for HTN.  Noted that he exercises regularly.  Questionable T wave inversions noted in V5 and V6 -> referred back to cardiology for further evaluation despite the fact that he did not want to be seen by  someone in our practice.=> Subsequently stopped acarbose and started glipizide.  Reviewed  CV studies:    The following studies were reviewed today: (if available, images/films reviewed: From Epic Chart or Care Everywhere) Myoview 01/21/2019: EF 60 to 65%.  LOW RISK.  Possible small area of mild ischemia in the mid and apical inferior wall. 2D Echo 03/30/2020: Normal LV size.  Sigmoid septum.  Normal EF-65 to 70%.  No segmental wall motion abnormality.  Trileaflet aortic valve with mild sclerosis but no stenosis.  Unable to assess RV pressure.  Interval History:   Alex Phillips presents here to establish cardiology care.  He says that he really does not have any major symptoms at all.  Does not really exercise very much is pretty deconditioned and therefore may get little short of breath when he exercises.  He says that he does note his breathing is improved since he quit smoking back in January (July 24, 2021).  Denies any real exertional or resting chest pain or pressure.  No PND, orthopnea with mild end of day swelling. He had been on glipizide and noted some hypoglycemic episodes associated with it.  Since then, he has gotten used to it, these are not happening anymore.  But otherwise no syncope or near syncopal symptoms. No rapid irregular heartbeats or palpitations.  No claudication.  No TIA or emesis fugax.  He said that as far as the palpitations go since being on magnesium oxide, he is not really had any further episodes with Mag-Ox and carvedilol.  REVIEWED OF SYSTEMS   Review of Systems  Constitutional:  Negative for malaise/fatigue (Just sedentary.) and weight loss.  HENT:  Negative for congestion and nosebleeds.   Respiratory:  Positive for cough (Occasional cough nothing concerning) and shortness of breath (If he overdoes it). Negative for sputum production.   Cardiovascular:        Per HPI  Gastrointestinal:  Negative for blood in stool and melena.  Genitourinary:  Negative  for hematuria.  Musculoskeletal:  Positive for joint pain. Negative for myalgias.  Neurological:  Negative for dizziness, focal weakness and weakness.  Psychiatric/Behavioral: Negative.     I have reviewed and (if needed) personally updated the patient's problem list, medications, allergies, past medical and surgical history, social and family history.   PAST MEDICAL HISTORY   Past Medical History:  Diagnosis Date   Bell's palsy    Diabetes mellitus 11/09   A1c- 6.2   DJD (degenerative joint disease)    l knee   EKG, abnormal 12-2007   Stress test (-)    Elevated PSA 2010   saw urology, PSA was reched and found to be normal. Bx was canceled, did get a u/s d/t michrohematuris- normal   Elevated WBC count    saw Dr.Enenever; likely benign, monitor   HTN (hypertension) 11/14/2006   Hyperlipidemia    Hypertension    Hypothyroidism     PAST SURGICAL HISTORY   Past Surgical History:  Procedure Laterality Date   MOUTH SURGERY     crown extention on right upper    TONSILLECTOMY AND ADENOIDECTOMY     TYMPANOSTOMY TUBE PLACEMENT     B as a child   VASECTOMY  02/2009    Immunization History  Administered Date(s) Administered   Influenza Whole 04/26/2006   Pneumococcal Conjugate-13 08/25/2014   Pneumococcal Polysaccharide-23 06/27/2012   Td 10/25/2003   Tdap 12/08/2013   Zoster Recombinat (Shingrix) 08/16/2021, 01/03/2022    MEDICATIONS/ALLERGIES   Current Meds  Medication Sig   amLODipine (NORVASC) 10 MG tablet Take 5 mg by mouth daily.   aspirin 81 MG tablet Take 81 mg by mouth daily.   carvedilol (COREG) 25 MG tablet Take 1 tablet (25 mg total) by mouth 2 (two) times daily with a meal.   dapagliflozin propanediol (FARXIGA) 10 MG TABS tablet Take 1 tablet (10 mg total) by mouth daily.   glipiZIDE (GLIPIZIDE XL) 2.5 MG 24 hr tablet Take 1 tablet by mouth every morning for 7 days, as long as blood sugars are not below 100, then increase to 2 tablets daily   KERENDIA 10 MG  TABS Take 1 tablet by mouth daily.   levothyroxine (SYNTHROID) 150 MCG tablet Take 1 tablet (150 mcg total) by mouth daily before breakfast.   losartan (COZAAR) 100 MG tablet Take 100 mg by mouth daily.   magnesium oxide (MAG-OX) 400 MG tablet Take 400 mg by mouth daily.   nitroGLYCERIN (NITROSTAT) 0.4 MG SL tablet Place 1 tablet (0.4 mg total) under the tongue every 5 (five) minutes as needed for chest pain (3 doses maximun).   pioglitazone (ACTOS) 30 MG tablet Take 1 tablet (30 mg total) by mouth daily.   rosuvastatin (CRESTOR) 40 MG tablet Take 1 tablet (40 mg total) by mouth daily.    Allergies  Allergen Reactions  Cefaclor Anaphylaxis and Swelling    Swelling of the throat   Chantix [Varenicline] Other (See Comments)    Aggression per Pt's wife   Amoxicillin-Pot Clavulanate Nausea Only, Swelling and Rash   Januvia [Sitagliptin] Rash    SOCIAL HISTORY/FAMILY HISTORY   Reviewed in Epic:   Social History   Tobacco Use   Smoking status: Former    Packs/day: 1.50    Years: 34.00    Total pack years: 51.00    Types: Cigarettes    Start date: 08/30/1981    Quit date: 07/24/2021    Years since quitting: 0.8   Smokeless tobacco: Never   Tobacco comments:    Quit 06-2021  Vaping Use   Vaping Use: Never used  Substance Use Topics   Alcohol use: No    Alcohol/week: 0.0 standard drinks of alcohol    Comment: rare   Drug use: No   Social History   Social History Narrative   Household: wife, 5 step kids total , 2 at home  (one is Down syndrome)       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    Works in Data processing managerlogistics for warehouses, similar to [employer]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   Family History  Adopted: Yes  Problem Relation Age of Onset   Dementia Mother    Colon cancer Neg Hx    Stomach cancer Neg Hx     OBJCTIVE -PE, EKG, labs   Wt Readings from Last 3 Encounters:  05/17/22 231 lb (104.8 kg)  05/08/22 232 lb (105.2 kg)  01/03/22 226 lb 6 oz (102.7 kg)     Physical Exam: BP 118/80 (BP Location: Left Arm, Patient Position: Sitting, Cuff Size: Large)   Pulse 65   Ht 5\' 11"  (1.803 m)   Wt 231 lb (104.8 kg)   SpO2 97%   BMI 32.22 kg/m  Physical Exam Vitals reviewed.  Constitutional:      General: He is not in acute distress.    Appearance: Normal appearance. He is obese. He is not ill-appearing or toxic-appearing.  HENT:     Head: Normocephalic and atraumatic.  Neck:     Vascular: No carotid bruit or JVD.  Cardiovascular:     Rate and Rhythm: Normal rate and regular rhythm. No extrasystoles are present.    Chest Wall: PMI is not displaced.     Pulses: Normal pulses.     Heart sounds: S1 normal and S2 normal. Murmur (Soft 1/6 SEM at RUSB) heard.     No friction rub. No gallop.  Pulmonary:     Effort: Pulmonary effort is normal. No respiratory distress.     Breath sounds: Normal breath sounds. No wheezing, rhonchi or rales.  Chest:     Chest wall: No tenderness.  Abdominal:     General: Bowel sounds are normal. There is no distension.     Palpations: Abdomen is soft. There is no mass (No HSM or bruit).     Tenderness: There is no abdominal tenderness. There is no guarding.  Musculoskeletal:        General: No swelling. Normal range of motion.     Cervical back: Normal range of motion and neck supple.  Skin:    General: Skin is warm and dry.  Neurological:     General: No focal deficit present.     Mental Status: He is alert and oriented to person, place, and time.     Gait: Gait normal.  Psychiatric:  Mood and Affect: Mood normal.        Behavior: Behavior normal.        Thought Content: Thought content normal.        Judgment: Judgment normal.     Adult ECG Report  Rate: 65;  Rhythm: normal sinus rhythm and cannot rule out anterior MI, age-indeterminate. ;   Narrative Interpretation: A-fib  Recent Labs: Reviewed. Lab Results  Component Value Date   CHOL 139 08/16/2021   HDL 49.90 08/16/2021   LDLCALC 68  08/16/2021   TRIG 106.0 08/16/2021   CHOLHDL 3 08/16/2021   Lab Results  Component Value Date   CREATININE 1.77 (H) 05/08/2022   BUN 28 (H) 05/08/2022   NA 140 05/08/2022   K 4.4 05/08/2022   CL 106 05/08/2022   CO2 26 05/08/2022      Latest Ref Rng & Units 05/08/2022   11:44 AM 11/10/2021   12:00 AM 07/25/2021    8:20 AM  CBC  WBC 4.0 - 10.5 K/uL 8.6  8.8     7.9   Hemoglobin 13.0 - 17.0 g/dL 76.2  83.1     51.7   Hematocrit 39.0 - 52.0 % 44.9  47     51.4   Platelets 150.0 - 400.0 K/uL 132.0  157     147      This result is from an external source.    Lab Results  Component Value Date   HGBA1C 7.4 (H) 05/08/2022   Lab Results  Component Value Date   TSH 2.21 01/03/2022    ================================================== I spent a total of 31 minutes with the patient spent in direct patient consultation.  Additional time spent with chart review  / charting (studies, outside notes, etc): 30 min Total Time: 61 min  Current medicines are reviewed at length with the patient today.  (+/- concerns) none  Notice: This dictation was prepared with Dragon dictation along with smart phrase technology. Any transcriptional errors that result from this process are unintentional and may not be corrected upon review.   Studies Ordered:  Orders Placed This Encounter  Procedures   EKG 12-Lead   No orders of the defined types were placed in this encounter.   Patient Instructions / Medication Changes & Studies & Tests Ordered   Patient Instructions  Medication Instructions:  No changes  *If you need a refill on your cardiac medications before your next appointment, please call your pharmacy*   Lab Work: Not needed    Testing/Procedures:  Not needed  Follow-Up: At [facility], you and your health needs are our priority.  As part of our continuing mission to provide you with exceptional heart care, we have created designated Provider Care Teams.  These Care  Teams include your primary Cardiologist (physician) and Advanced Practice Providers (APPs -  Physician Assistants and Nurse Practitioners) who all work together to provide you with the care you need, when you need it.     Your next appointment:   6 month(s)  The format for your next appointment:   In Person  Provider:   Bryan Lemma, MD      Marykay Lex, MD, MS Bryan Lemma, M.D., M.S. Interventional Cardiologist  [facility]  Pager # [phone] Phone # [phone] [address] [place], [place] [address]   Thank you for choosing [facility] [place]!!

## 2022-05-17 ENCOUNTER — Ambulatory Visit: Payer: Commercial Managed Care - HMO | Attending: Cardiology | Admitting: Cardiology

## 2022-05-17 ENCOUNTER — Encounter: Payer: Self-pay | Admitting: Cardiology

## 2022-05-17 VITALS — BP 118/80 | HR 65 | Ht 71.0 in | Wt 231.0 lb

## 2022-05-17 DIAGNOSIS — E785 Hyperlipidemia, unspecified: Secondary | ICD-10-CM | POA: Diagnosis not present

## 2022-05-17 DIAGNOSIS — E1169 Type 2 diabetes mellitus with other specified complication: Secondary | ICD-10-CM | POA: Diagnosis not present

## 2022-05-17 DIAGNOSIS — I2510 Atherosclerotic heart disease of native coronary artery without angina pectoris: Secondary | ICD-10-CM

## 2022-05-17 DIAGNOSIS — N1831 Chronic kidney disease, stage 3a: Secondary | ICD-10-CM

## 2022-05-17 DIAGNOSIS — I10 Essential (primary) hypertension: Secondary | ICD-10-CM

## 2022-05-17 DIAGNOSIS — Z87891 Personal history of nicotine dependence: Secondary | ICD-10-CM

## 2022-05-17 NOTE — Patient Instructions (Signed)
Medication Instructions:   No changes *If you need a refill on your cardiac medications before your next appointment, please call your pharmacy*   Lab Work:  Not needed   Testing/Procedures: Not needed   Follow-Up: At [facility], you and your health needs are our priority.  As part of our continuing mission to provide you with exceptional heart care, we have created designated Provider Care Teams.  These Care Teams include your primary Cardiologist (physician) and Advanced Practice Providers (APPs -  Physician Assistants and Nurse Practitioners) who all work together to provide you with the care you need, when you need it.     Your next appointment:   6 month(s)  The format for your next appointment:   In Person  Provider:   Markham Harding, MD    

## 2022-05-21 ENCOUNTER — Encounter: Payer: Self-pay | Admitting: Cardiology

## 2022-05-21 NOTE — Assessment & Plan Note (Signed)
BP well controlled on current medicines.  He is on multiple medications albeit no diuretic.  If he were to have additional BP med requirement, would recommend thiazide diuretic.  Otherwise he is already on max dose amlodipine, carvedilol and losartan.

## 2022-05-21 NOTE — Assessment & Plan Note (Signed)
On high-dose rosuvastatin with excellent lipid control.  HDL almost 50, and LDL 68.  Triglycerides 106. => With continued weight loss/diet and exercise, this should continue to improve.  A1c 7.4.  This is the only remaining risk factor chronic and under control. He is on Farxiga glipizide and pioglitazone,--would consider GLP-1 agonist that would help with weight loss and improve glycemic control.  With good parents for underlying cardiac disease, would recommend changing from pioglitazone to different medication.

## 2022-05-21 NOTE — Assessment & Plan Note (Signed)
61 year old gentleman with hypertension, hyperlipidemia and diabetes who is a former smoker.  Not unlikely to have coronary calcium on CT scan.  Previously evaluated with nuclear stress test that suggested possible apical inferior ischemia.  Interestingly there is normal wall motion in that area on both Myoview and echo.  At the time of the visual evaluation 2020 he was asymptomatic, and because he was upset about having had a nuclear stress test instead of just a treadmill stress test ordered, he was lost to follow-up. He opted out of having any further evaluation by [facility], after having a relatively normal echocardiogram.  The plan had been to consider Coronary CTA.  At this point, he is completely asymptomatic as far as any chest pain or pressure is concerned.  We talked about management going forward and essentially the recommended treatment for having an elevated Coronary Calcium Score will be absent symptoms will be to continue aggressive risk factor modification.  Also, elevated Coronary Calcium Score would warrant the addition of aspirin.  Presently, he is already on aspirin, high-dose beta-blocker high-dose ARB and high-dose calcium channel blocker along with high-dose statin.  These are all medications that would appropriately be used to treat an asymptomatic patient with coronary disease. His A1c was 7.4, and being addressed by his PCP (may consider switching from glipizide for GLP-1 agonist)  Lipids are well controlled and LDL already less than 70 which would be the goal for CAD. BP is well controlled albeit on multiple medications. He has quit smoking, and energy level is picking up. Discussed importance of weight loss and increasing exercise. Major educational information today was discussed the pathophysiology of atherosclerotic disease and plaque burden.   We talked about whether a Coronary CT Angiogram or Coronary calcium score would beneficial, but  truthfully Coronary Calcium Score would not necessarily change her management, nor would it Coronary CTA be indicated in the absence of symptoms. If he were to have concerning symptoms of exertional dyspnea or chest tightness or pressure, then certainly Coronary CTA would be indicated.

## 2022-05-21 NOTE — Assessment & Plan Note (Signed)
After longtime smoking, he finally quit smoking this January.  Doing well. Congratulated his efforts.

## 2022-05-21 NOTE — Assessment & Plan Note (Signed)
Most recent creatinine was 1.77.  Followed by nephrology.  Recently started on Karendia.

## 2022-05-24 ENCOUNTER — Encounter: Payer: Self-pay | Admitting: Internal Medicine

## 2022-05-29 ENCOUNTER — Other Ambulatory Visit: Payer: Self-pay | Admitting: Acute Care

## 2022-05-29 ENCOUNTER — Other Ambulatory Visit (HOSPITAL_BASED_OUTPATIENT_CLINIC_OR_DEPARTMENT_OTHER): Payer: Self-pay | Admitting: Acute Care

## 2022-05-29 DIAGNOSIS — F17200 Nicotine dependence, unspecified, uncomplicated: Secondary | ICD-10-CM

## 2022-05-29 DIAGNOSIS — Z87891 Personal history of nicotine dependence: Secondary | ICD-10-CM

## 2022-05-29 DIAGNOSIS — F17210 Nicotine dependence, cigarettes, uncomplicated: Secondary | ICD-10-CM

## 2022-05-30 ENCOUNTER — Ambulatory Visit (HOSPITAL_BASED_OUTPATIENT_CLINIC_OR_DEPARTMENT_OTHER): Admission: RE | Admit: 2022-05-30 | Payer: Commercial Managed Care - HMO | Source: Ambulatory Visit

## 2022-05-31 IMAGING — CT CT CHEST LUNG CANCER SCREENING LOW DOSE W/O CM
1 series · 10 of 10 positions shown, 13 images · non-contrast
Comparison: 03/12/2020

CLINICAL DATA: Lung cancer screening. Current smoker. Thirty-seven
pack-year history.

EXAM:
CT CHEST WITHOUT CONTRAST LOW-DOSE FOR LUNG CANCER SCREENING
TECHNIQUE: Multidetector CT imaging of the chest was performed following the
standard protocol without IV contrast.

[ct lung segmentation data · axial · 0.75mm/px · z∈[-320,-320]mm · 10 of 292 frames shown]
[frame 1/292  mediastinal]
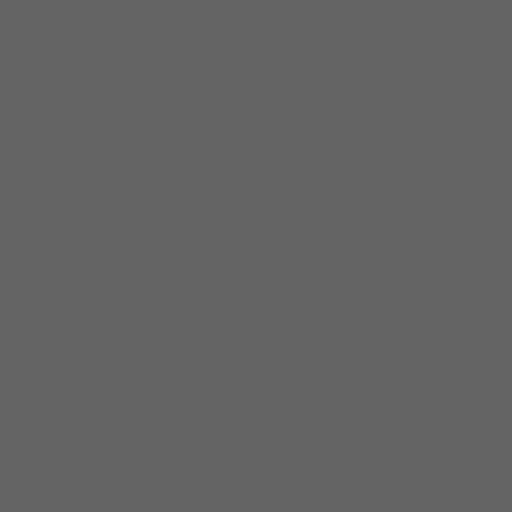
[frame 1/292  lung]
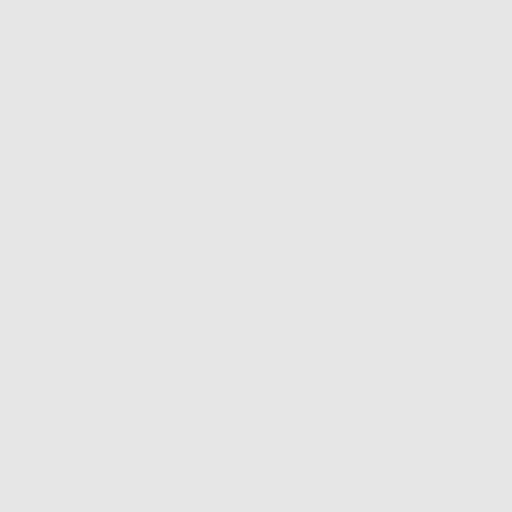
[frame 33/292  lung]
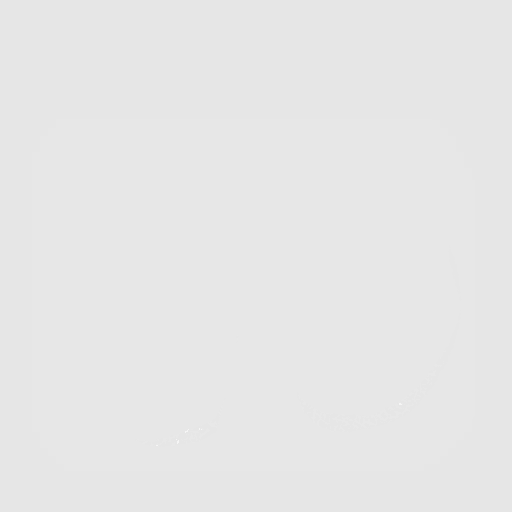
[frame 65/292  lung]
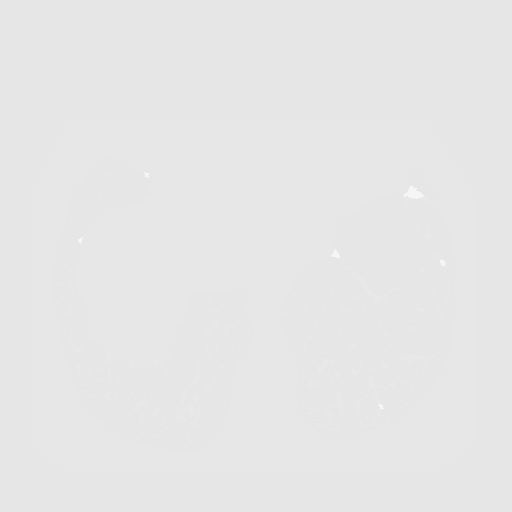
[frame 98/292  lung]
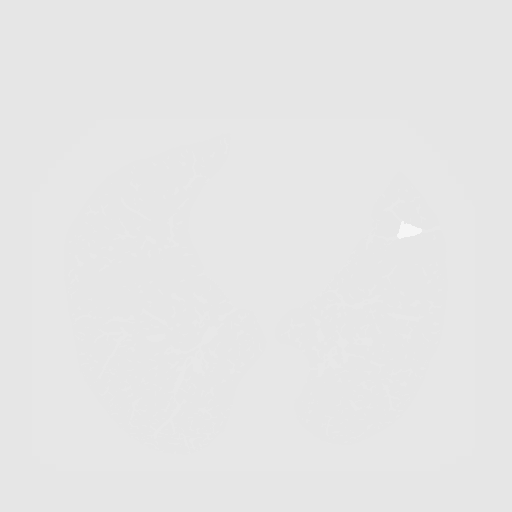
[frame 130/292  mediastinal]
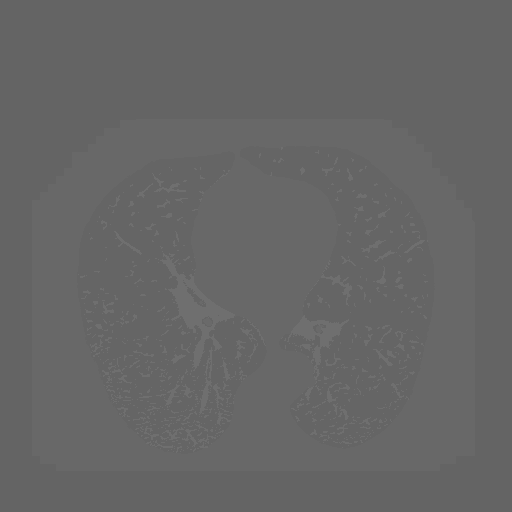
[frame 130/292  lung]
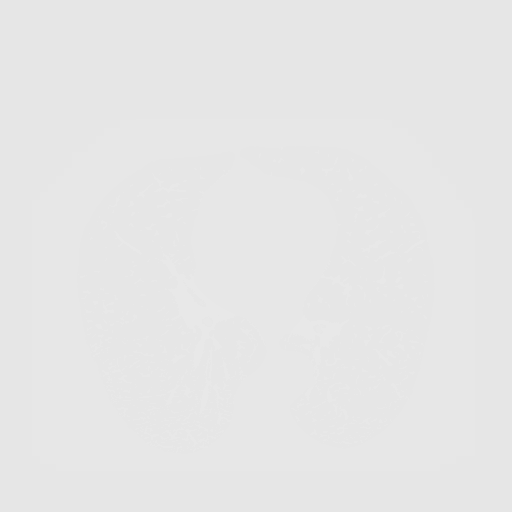
[frame 162/292  lung]
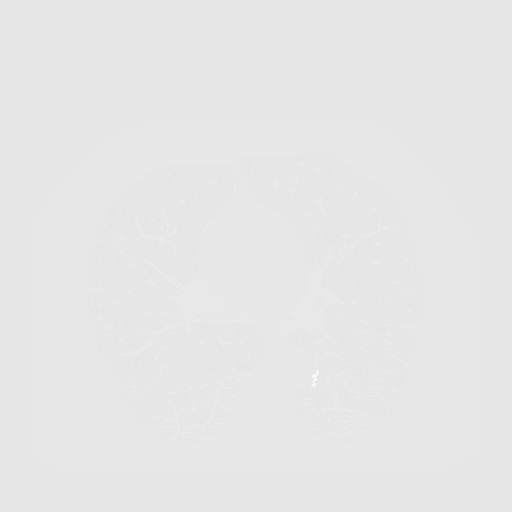
[frame 195/292  lung]
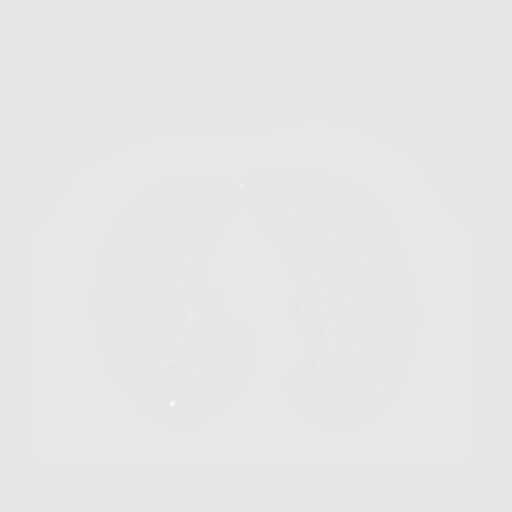
[frame 227/292  lung]
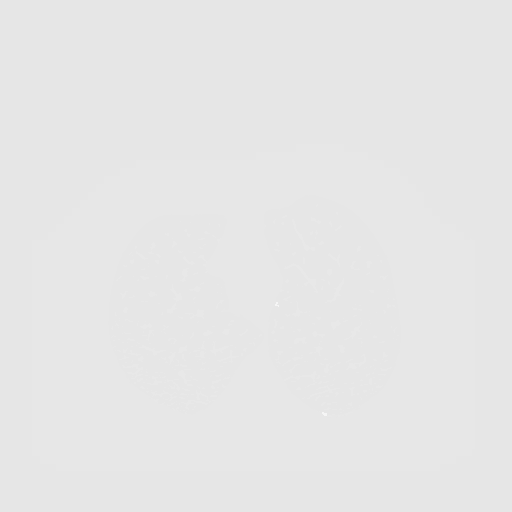
[frame 259/292  mediastinal]
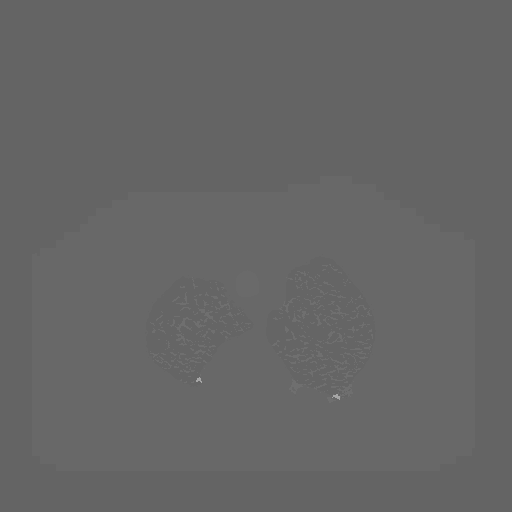
[frame 259/292  lung]
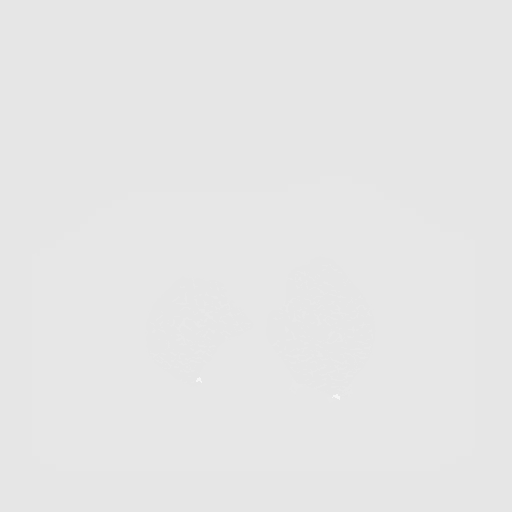
[frame 292/292  lung]
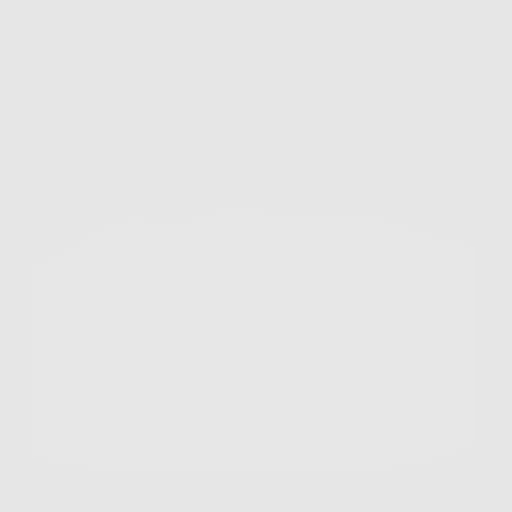

[10 of 10 positions shown; findings below may reference images not displayed]

FINDINGS: Cardiovascular: Normal heart size. Aortic atherosclerosis. Coronary
artery calcifications.

Mediastinum/Nodes: No enlarged mediastinal, hilar, or axillary lymph
nodes. Thyroid gland, trachea, and esophagus demonstrate no
significant findings.

Lungs/Pleura: Mild to moderate changes of centrilobular emphysema.
No pleural effusion, airspace consolidation or atelectasis.
Progressive scarring noted within the inferior lingula. Multiple
small lung nodules are again noted, not significantly changed in the
interval. The largest lung nodule is in the posteromedial right
lower lobe with a mean derived diameter of 5.1 mm. No new lung
nodules.

Upper Abdomen: No acute abnormality.

Musculoskeletal: No chest wall mass or suspicious bone lesions
identified.
IMPRESSION: 1. Lung-RADS 2, benign appearance or behavior. Continue annual
screening with low-dose chest CT without contrast in 12 months.
2. Coronary artery calcifications.
3. Aortic Atherosclerosis (LMXOD-NPL.L) and Emphysema (LMXOD-FVZ.J).

## 2022-06-01 ENCOUNTER — Inpatient Hospital Stay (HOSPITAL_BASED_OUTPATIENT_CLINIC_OR_DEPARTMENT_OTHER): Admission: RE | Admit: 2022-06-01 | Payer: Commercial Managed Care - HMO | Source: Ambulatory Visit

## 2022-06-05 ENCOUNTER — Ambulatory Visit (HOSPITAL_BASED_OUTPATIENT_CLINIC_OR_DEPARTMENT_OTHER): Payer: Commercial Managed Care - HMO

## 2022-07-04 ENCOUNTER — Other Ambulatory Visit: Payer: Self-pay | Admitting: Internal Medicine

## 2022-07-04 ENCOUNTER — Ambulatory Visit
Admission: RE | Admit: 2022-07-04 | Discharge: 2022-07-04 | Disposition: A | Discharge status: Home / Self Care | Payer: Commercial Managed Care - HMO | Source: Ambulatory Visit | Attending: Internal Medicine | Admitting: Internal Medicine

## 2022-07-04 DIAGNOSIS — Z87891 Personal history of nicotine dependence: Secondary | ICD-10-CM

## 2022-07-04 DIAGNOSIS — F17200 Nicotine dependence, unspecified, uncomplicated: Secondary | ICD-10-CM

## 2022-07-04 DIAGNOSIS — F17210 Nicotine dependence, cigarettes, uncomplicated: Secondary | ICD-10-CM

## 2022-07-06 ENCOUNTER — Other Ambulatory Visit: Payer: Self-pay

## 2022-07-06 DIAGNOSIS — Z87891 Personal history of nicotine dependence: Secondary | ICD-10-CM

## 2022-07-06 DIAGNOSIS — Z122 Encounter for screening for malignant neoplasm of respiratory organs: Secondary | ICD-10-CM

## 2022-07-21 IMAGING — US US RENAL
1 series · 14 of 25 positions shown · non-contrast
Comparison: CT abdomen and pelvis 04/21/2020. Renal ultrasound
02/26/2009.

CLINICAL DATA: Stage 3 chronic kidney disease.

EXAM:
RENAL / URINARY TRACT ULTRASOUND COMPLETE

[Series 1: us renal · 0.23mm/px · 14 of 31 slices shown]
[im 1/31]
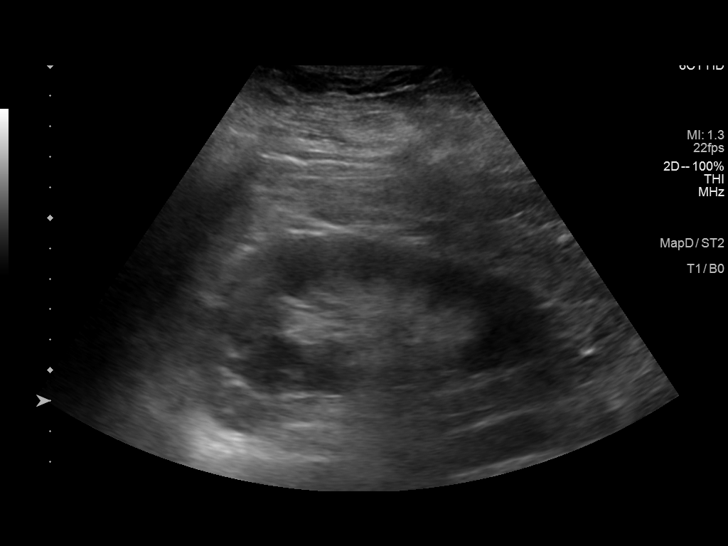
[im 3/31]
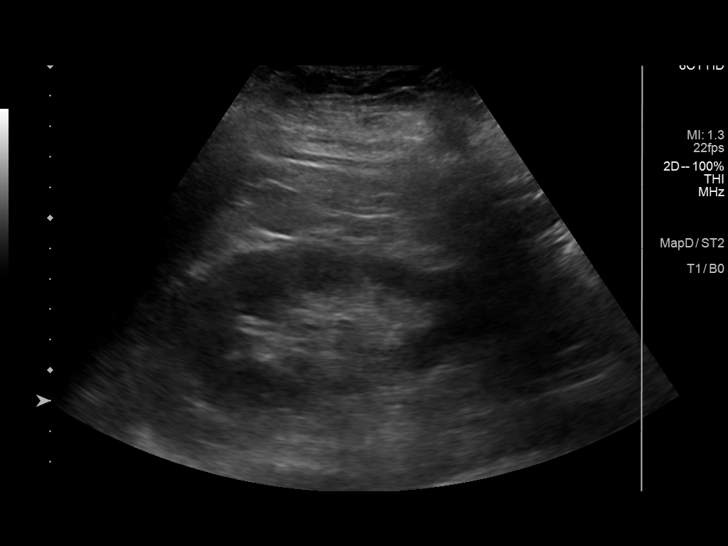
[im 6/31]
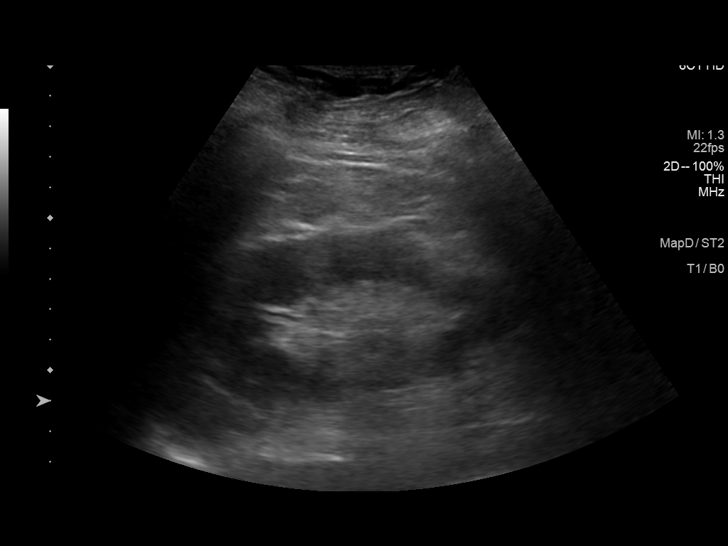
[im 8/31]
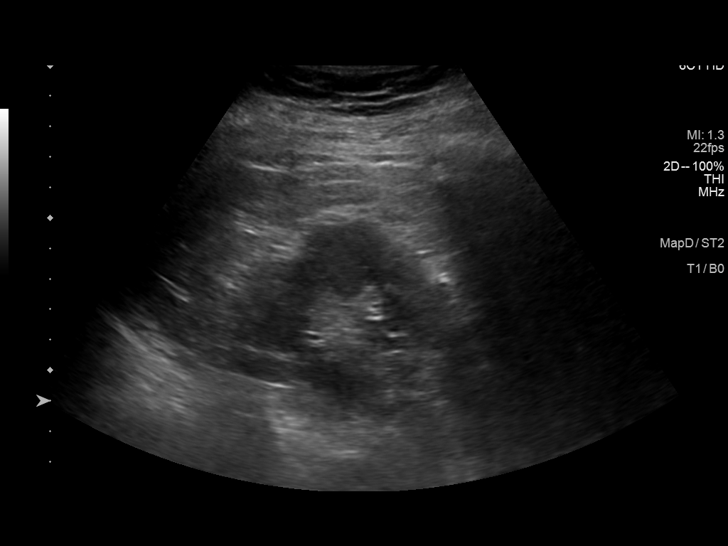
[im 11/31]
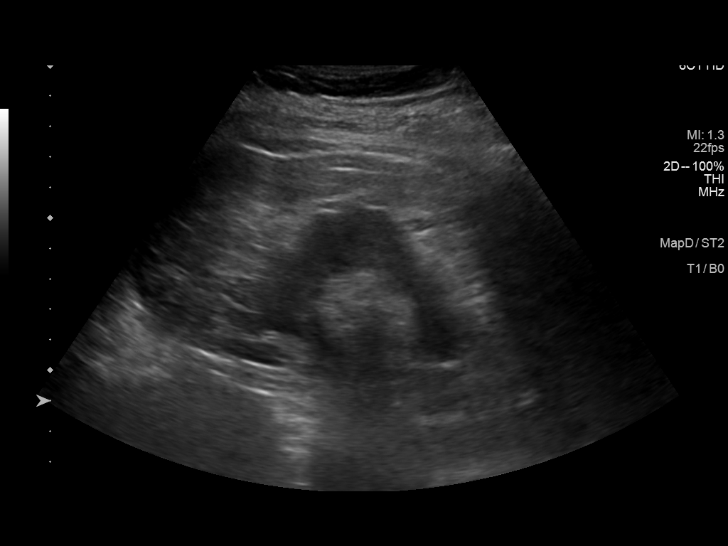
[im 12/31]
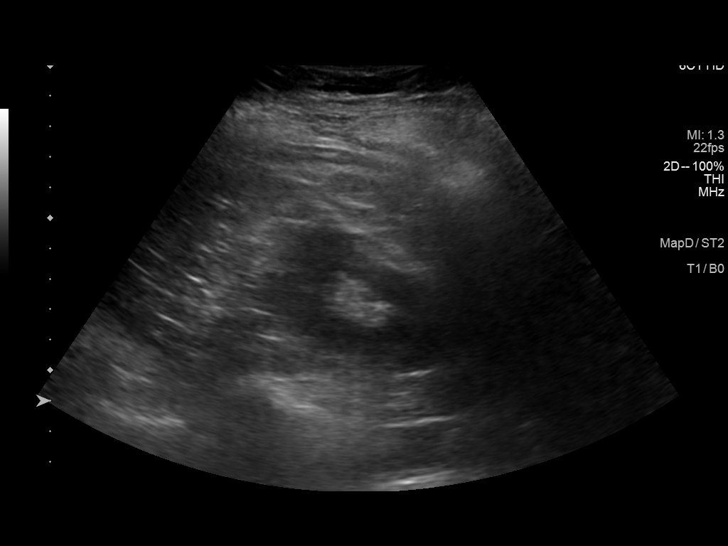
[im 14/31]
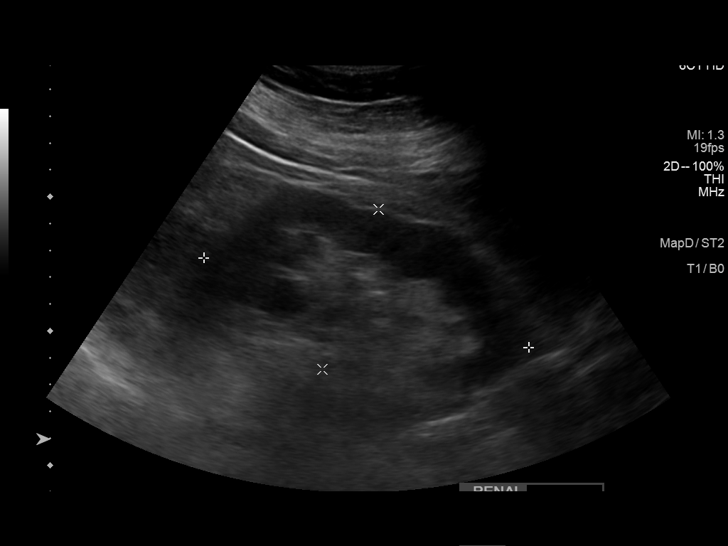
[im 17/31]
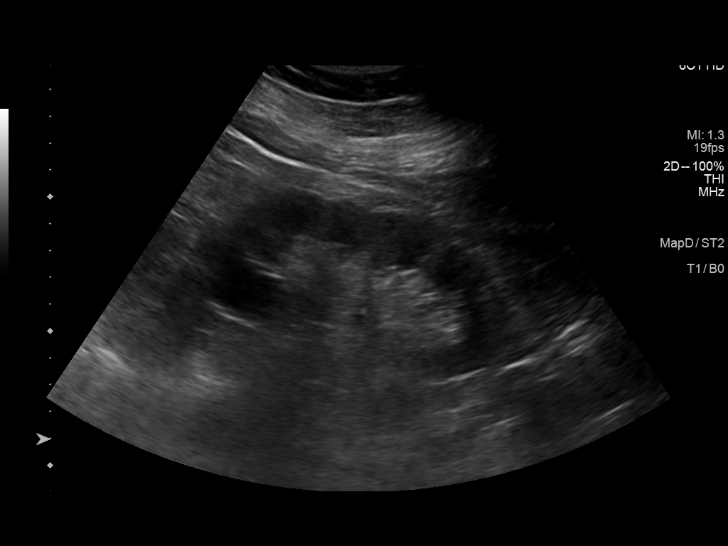
[im 19/31]
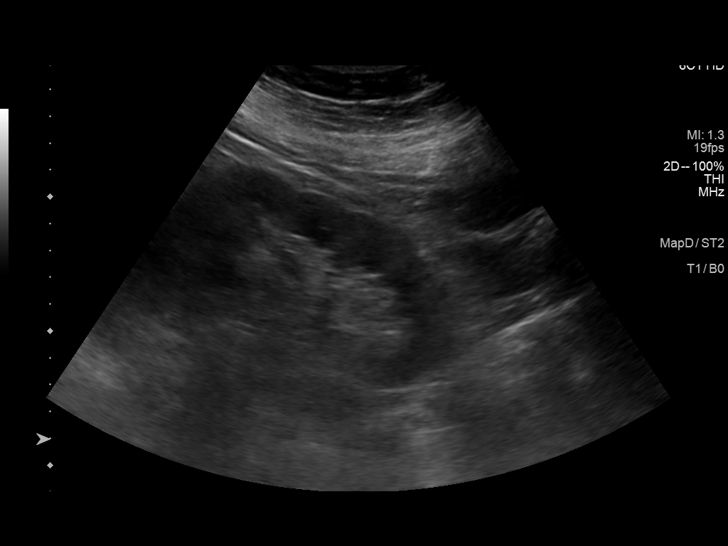
[im 21/31]
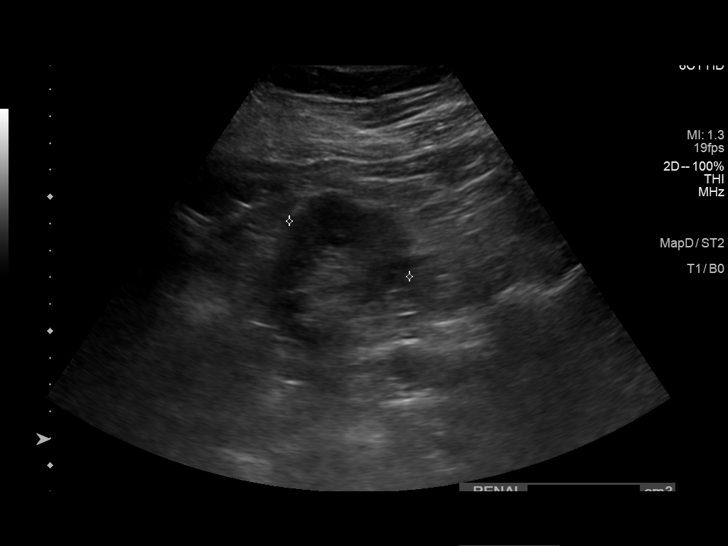
[im 23/31]
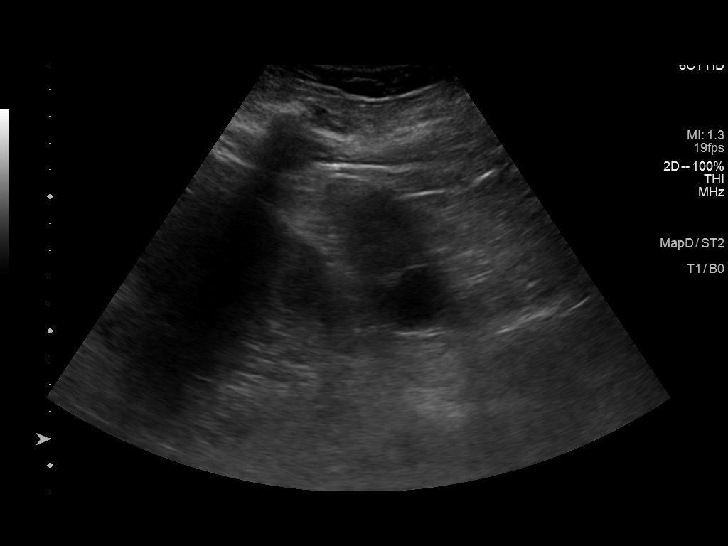
[im 26/31]
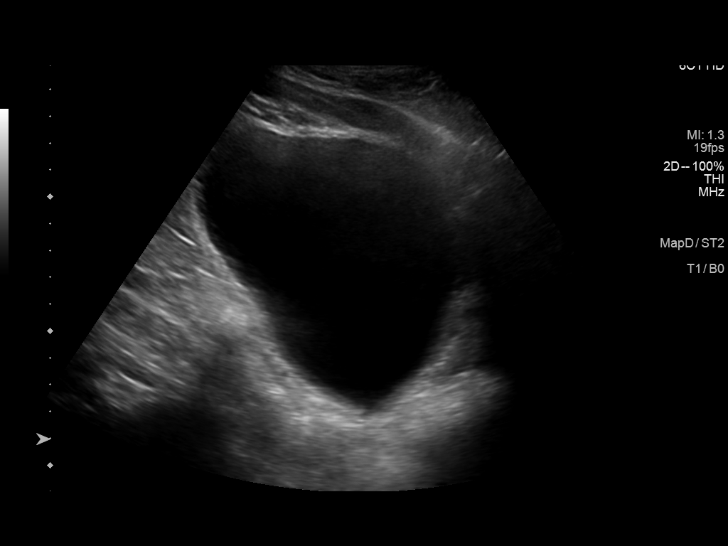
[im 28/31]
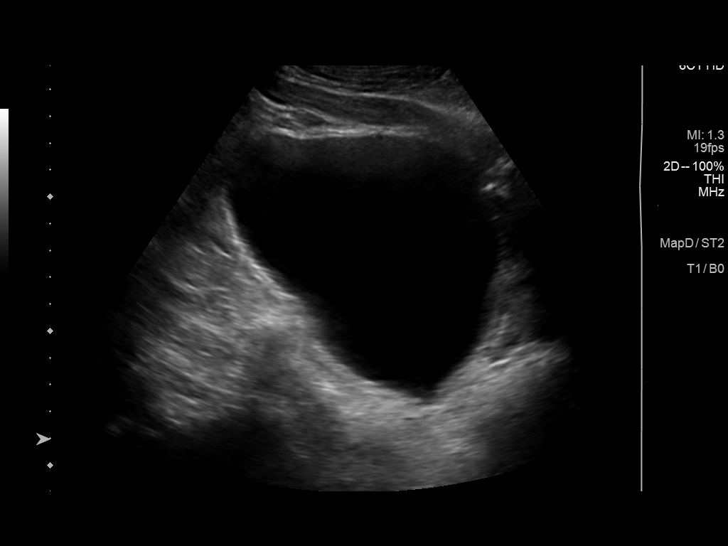
[im 31/31]
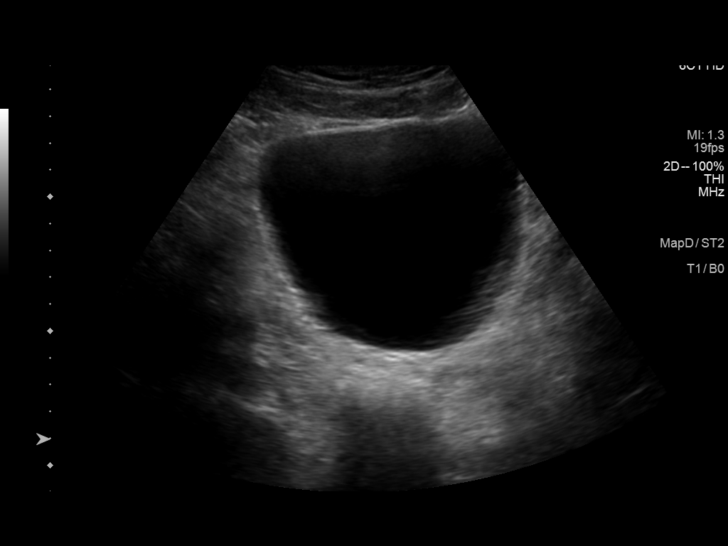

[14 of 25 positions shown; findings below may reference images not displayed]

FINDINGS: Right Kidney:

Renal measurements: 11.7 x 5.1 x 5.5 cm = volume: 172 mL.
Echogenicity within normal limits. No mass or hydronephrosis
visualized.

Left Kidney:

Renal measurements: 12.6 x 6.3 x 4.9 cm = volume: 204 mL.
Echogenicity within normal limits. No mass or hydronephrosis
visualized. There is a 2 point 1 x 3.0 cm cyst in the superior pole.

Bladder:

Appears normal for degree of bladder distention.

Other:

None.
IMPRESSION: 1. Bilateral renal cysts.
2. Otherwise normal renal ultrasound.

## 2022-08-03 ENCOUNTER — Encounter: Payer: Self-pay | Admitting: Internal Medicine

## 2022-08-08 ENCOUNTER — Other Ambulatory Visit: Payer: Self-pay | Admitting: Internal Medicine

## 2022-09-06 ENCOUNTER — Encounter: Payer: Self-pay | Admitting: Internal Medicine

## 2022-09-06 ENCOUNTER — Ambulatory Visit (INDEPENDENT_AMBULATORY_CARE_PROVIDER_SITE_OTHER): Payer: Commercial Managed Care - HMO | Admitting: Internal Medicine

## 2022-09-06 VITALS — BP 126/84 | HR 73 | Temp 98.0°F | Resp 16 | Ht 71.0 in | Wt 232.0 lb

## 2022-09-06 DIAGNOSIS — I2510 Atherosclerotic heart disease of native coronary artery without angina pectoris: Secondary | ICD-10-CM

## 2022-09-06 DIAGNOSIS — E785 Hyperlipidemia, unspecified: Secondary | ICD-10-CM | POA: Diagnosis not present

## 2022-09-06 DIAGNOSIS — E039 Hypothyroidism, unspecified: Secondary | ICD-10-CM | POA: Diagnosis not present

## 2022-09-06 DIAGNOSIS — Z794 Long term (current) use of insulin: Secondary | ICD-10-CM

## 2022-09-06 DIAGNOSIS — I10 Essential (primary) hypertension: Secondary | ICD-10-CM | POA: Diagnosis not present

## 2022-09-06 DIAGNOSIS — Z125 Encounter for screening for malignant neoplasm of prostate: Secondary | ICD-10-CM | POA: Diagnosis not present

## 2022-09-06 DIAGNOSIS — Z0001 Encounter for general adult medical examination with abnormal findings: Secondary | ICD-10-CM

## 2022-09-06 DIAGNOSIS — E1165 Type 2 diabetes mellitus with hyperglycemia: Secondary | ICD-10-CM

## 2022-09-06 DIAGNOSIS — Z0000 Encounter for general adult medical examination without abnormal findings: Secondary | ICD-10-CM | POA: Diagnosis not present

## 2022-09-06 DIAGNOSIS — E1169 Type 2 diabetes mellitus with other specified complication: Secondary | ICD-10-CM | POA: Diagnosis not present

## 2022-09-06 LAB — COMPREHENSIVE METABOLIC PANEL
ALT: 15 U/L (ref 0–53)
AST: 15 U/L (ref 0–37)
Albumin: 4.1 g/dL (ref 3.5–5.2)
Alkaline Phosphatase: 64 U/L (ref 39–117)
BUN: 23 mg/dL (ref 6–23)
CO2: 24 mEq/L (ref 19–32)
Calcium: 9.5 mg/dL (ref 8.4–10.5)
Chloride: 107 mEq/L (ref 96–112)
Creatinine, Ser: 1.68 mg/dL — ABNORMAL HIGH (ref 0.40–1.50)
GFR: 43.66 mL/min — ABNORMAL LOW (ref >60.00)
Glucose, Bld: 142 mg/dL — ABNORMAL HIGH (ref 70–99)
Potassium: 4.4 mEq/L (ref 3.5–5.1)
Sodium: 139 mEq/L (ref 135–145)
Total Bilirubin: 0.5 mg/dL (ref 0.2–1.2)
Total Protein: 6.8 g/dL (ref 6.0–8.3)

## 2022-09-06 LAB — LIPID PANEL
Cholesterol: 121 mg/dL (ref 0–200)
HDL: 40 mg/dL (ref >39.00)
LDL Cholesterol: 57 mg/dL (ref 0–99)
NonHDL: 80.93
Total CHOL/HDL Ratio: 3
Triglycerides: 118 mg/dL (ref 0.0–149.0)
VLDL: 23.6 mg/dL (ref 0.0–40.0)

## 2022-09-06 LAB — PSA: PSA: 0.89 ng/mL (ref 0.10–4.00)

## 2022-09-06 LAB — TSH: TSH: 0.17 u[IU]/mL — ABNORMAL LOW (ref 0.35–5.50)

## 2022-09-06 LAB — HEMOGLOBIN A1C: Hgb A1c MFr Bld: 6.7 % — ABNORMAL HIGH (ref 4.6–6.5)

## 2022-09-06 NOTE — Patient Instructions (Addendum)
Try to engage on routine exercise program, yoga might help your stiffness.  Vaccines I recommend: RSV vaccine  Per our records you are due for your diabetic eye exam. Please contact your eye doctor to schedule an appointment. Please have them send copies of your office visit notes to [place]. Our fax number is [phone]. If you need a referral to an eye doctor please let us know.  Check the  blood pressure regularly BP GOAL is between 110/65 and  135/85. If it is consistently higher or lower, let me know    GO TO THE LAB : Get the blood work     [place], [place] back for   checkup in 6 months    "Uncertain of attorney" ,  "Living will" (Advance care planning documents)  If you already have a living will or healthcare power of attorney, is recommended you bring the copy to be scanned in your chart.   The document will be available to all the doctors you see in the system.  Advance care planning is a process that supports adults in  understanding and sharing their preferences regarding future medical care.  The patient's preferences are recorded in documents called Advance Directives and the can be modified at any time while the patient is in full mental capacity.   If you don't have one, please consider create one.      More information at: [email or web address]

## 2022-09-06 NOTE — Progress Notes (Unsigned)
Subjective:    Patient ID: Alex Phillips, male    DOB: 1961/05/01, 62 y.o.   MRN: [id number]  DOS:  09/06/2022 Type of visit - description: cpx  Here for CPX, in general feels well. He specifically denies chest pain or difficulty breathing. Admits to a sedentary lifestyle. No LUTS   Wt Readings from Last 3 Encounters:  09/06/22 232 lb (105.2 kg)  05/17/22 231 lb (104.8 kg)  05/08/22 232 lb (105.2 kg)    Review of Systems  Other than above, a 14 point review of systems is negative   Past Medical History:  Diagnosis Date   Bell's palsy    Diabetes mellitus 11/09   A1c- 6.2   DJD (degenerative joint disease)    l knee   EKG, abnormal 12-2007   Stress test (-)    Elevated PSA 2010   saw urology, PSA was reched and found to be normal. Bx was canceled, did get a u/s d/t michrohematuris- normal   Elevated WBC count    saw Dr.Enenever; likely benign, monitor   HTN (hypertension) 11/14/2006   Hyperlipidemia    Hypertension    Hypothyroidism     Past Surgical History:  Procedure Laterality Date   MOUTH SURGERY     crown extention on right upper    TONSILLECTOMY AND ADENOIDECTOMY     TYMPANOSTOMY TUBE PLACEMENT     B as a child   VASECTOMY  02/2009   Social History   Socioeconomic History   Marital status: Married    Spouse name: Not on file   Number of children: 0   Years of education: Not on file   Highest education level: Not on file  Occupational History   Occupation: RETIRED Furniture conservator/restorer   Occupation: Retired 03-2018  Tobacco Use   Smoking status: Former    Packs/day: 1.50    Years: 34.00    Additional pack years: 0.00    Total pack years: 51.00    Types: Cigarettes    Start date: 08/30/1981    Quit date: 07/24/2021    Years since quitting: 1.1   Smokeless tobacco: Never   Tobacco comments:    Quit 06-2021  Vaping Use   Vaping Use: Never used  Substance and Sexual Activity   Alcohol use: No    Alcohol/week: 0.0 standard drinks of  alcohol    Comment: rare   Drug use: No   Sexual activity: Not on file  Other Topics Concern   Not on file  Social History Narrative   Household: wife, 5 step kids total , 2 at home  (one is Down syndrome)       Pt was a Pharmacist, community from elementary to college    Works in Chief Executive Officer for warehouses, 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[employer], [place]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   Social Determinants of Health   Financial Resource Strain: Not on file  Food Insecurity: Not on file  Transportation Needs: Not on file  Physical Activity: Not on file  Stress: Not on file  Social Connections: Not on file  Intimate Partner Violence: Not on file    Current Outpatient Medications  Medication Instructions   amLODipine (NORVASC) 5 mg, Oral, Daily   aspirin 81 mg, Oral, Daily,     carvedilol (COREG) 25 mg, Oral, 2 times daily with meals   dapagliflozin propanediol (FARXIGA) 10 mg, Oral, Daily   glipiZIDE (GLUCOTROL XL) 2.5 MG 24 hr tablet TAKE 1 TABLET BY MOUTH EVERY MORNING FOR 7 DAYS.  IF BLOOD SUGARS ARE NOT BELOW 100, THEN INCREASE TO 2 TABLETS DAILY   KERENDIA 10 MG TABS 1 tablet, Oral, Daily   levothyroxine (SYNTHROID) 150 mcg, Oral, Daily before breakfast   losartan (COZAAR) 100 mg, Oral, Daily   magnesium oxide (MAG-OX) 400 mg, Oral, Daily   nitroGLYCERIN (NITROSTAT) 0.4 mg, Sublingual, Every 5 min PRN   pioglitazone (ACTOS) 30 mg, Oral, Daily   rosuvastatin (CRESTOR) 40 mg, Oral, Daily       Objective:   Physical Exam BP 126/84   Pulse 73   Temp 98 F (36.7 C) (Oral)   Resp 16   Ht '5\' 11"'$  (1.803 m)   Wt 232 lb (105.2 kg)   SpO2 95%   BMI 32.36 kg/m  General: Well developed, NAD, BMI noted Neck: No  thyromegaly  HEENT:  Normocephalic . Face symmetric, atraumatic Lungs:  CTA B Normal respiratory effort, no intercostal retractions, no accessory muscle use. Heart: RRR,  no murmur.  Abdomen:  Not distended, soft, non-tender. No rebound or rigidity.   Lower extremities: Trace pretibial edema  bilaterally  Skin: Exposed areas without rash. Not pale. Not jaundice Neurologic:  alert & oriented X3.  Speech normal, gait appropriate for age and unassisted Strength symmetric and appropriate for age.  Psych: Cognition and judgment appear intact.  Cooperative with normal attention span and concentration.  Behavior appropriate. No anxious or depressed appearing.     Assessment    Assessment  DM: - Avoid metformin (CKD) - h/o excessive weight loss with Victoza HTN Hyperlipidemia Hypothyroidism CAD:  --cor Ca+ per CTCoronary calcifications,  stress test 01/20/2019: ischemic changes , declined  CTA coronary --See cardiology note 01-2019 COPD: per CT, former heavy smoker  CKD DJD Bell's palsy Hematology: --Leukocytosis, benign per Hem, monitoring --Erythrocytosis, JAK2 = variant of no clinical , per hematology 2023, f/u prn Quit Tobacco  06-2021  Pt is adopted  PLAN: Here for CPX DM: Not well-controlled, currently on Farxiga, Glucotrol, pioglitazone.  Will adjust his medication with results (cards suggested add a GLP-1, stop pioglitazone?) CAD: [facility] cardiology November 2023, note reviewed, see above.  Was noted to be asymptomatic Hyperlipidemia: On Crestor, check FLP. Hypothyroidism: On Synthroid, check TSH CKD: Sees nephrology regularly. RTC 6 months

## 2022-09-07 ENCOUNTER — Encounter: Payer: Self-pay | Admitting: Internal Medicine

## 2022-09-07 NOTE — Assessment & Plan Note (Signed)
-   Tdap 2015 -prevnar- 2016 - PNM 23:  2014 - shingrex x2 -Recommend  covid vaccines "definitely no" - Flu shots: declined (consistently) -- CCS 12-2003: Cscope , TICs, hyperplastic polyps  08-2013 Cscope no polyps, 10 years -- Prostate cancer screening: Unknown family history, no symptoms, check PSA.  - Lifestyle: Very sedentary, encouraged to engage on a gradual and routine exercise program.  Yoga?. -Tobacco abuse: Quit more than a year ago. - lung cancer screening: Next CT due 06/2023 -Labs: FLP, A1c, TSH, free T4 PSA - Healthcare POA: See AVS

## 2022-09-07 NOTE — Assessment & Plan Note (Signed)
Here for CPX DM: Not well-controlled, currently on Farxiga, Glucotrol, pioglitazone.  Will adjust his medication with results (cards suggested add a GLP-1, stop pioglitazone?) CAD: [facility] cardiology November 2023, note reviewed, see above.  Was noted to be asymptomatic Hyperlipidemia: On Crestor, check FLP. Hypothyroidism: On Synthroid, check TSH CKD: Sees nephrology regularly. RTC 6 months

## 2022-09-08 MED ORDER — LEVOTHYROXINE SODIUM 137 MCG PO TABS: 137.0000 ug | ORAL_TABLET | Freq: Every day | ORAL | 0 refills | Status: DC

## 2022-09-08 NOTE — Addendum Note (Signed)
Addended byDamita Dunnings D on: 09/08/2022 02:38 PM   Modules accepted: Orders

## 2022-09-21 LAB — HM DIABETES EYE EXAM

## 2022-09-23 ENCOUNTER — Other Ambulatory Visit: Payer: Self-pay | Admitting: Internal Medicine

## 2022-09-28 ENCOUNTER — Other Ambulatory Visit (HOSPITAL_COMMUNITY): Payer: Self-pay

## 2022-10-01 ENCOUNTER — Other Ambulatory Visit: Payer: Self-pay | Admitting: Internal Medicine

## 2022-10-03 ENCOUNTER — Other Ambulatory Visit (HOSPITAL_COMMUNITY): Payer: Self-pay

## 2022-10-15 ENCOUNTER — Other Ambulatory Visit: Payer: Self-pay | Admitting: Internal Medicine

## 2022-10-19 ENCOUNTER — Other Ambulatory Visit: Payer: Self-pay | Admitting: Internal Medicine

## 2022-11-06 ENCOUNTER — Other Ambulatory Visit: Payer: Self-pay | Admitting: Internal Medicine

## 2022-11-13 LAB — BASIC METABOLIC PANEL
BUN: 17 (ref 4–21)
CO2: 23 — AB (ref 13–22)
Chloride: 101 (ref 99–108)
Creatinine: 1.6 — AB (ref 0.6–1.3)
Glucose: 129
Potassium: 4.6 mEq/L (ref 3.5–5.1)
Sodium: 139 (ref 137–147)

## 2022-11-13 LAB — COMPREHENSIVE METABOLIC PANEL
Albumin: 4.7 (ref 3.5–5.0)
Calcium: 9.5 (ref 8.7–10.7)
eGFR: 50

## 2022-11-13 LAB — CBC AND DIFFERENTIAL: Hemoglobin: 16.3 (ref 13.5–17.5)

## 2022-11-15 ENCOUNTER — Ambulatory Visit: Payer: Commercial Managed Care - HMO | Attending: Cardiology | Admitting: Cardiology

## 2022-11-15 VITALS — BP 108/82 | HR 64 | Ht 71.0 in | Wt 235.0 lb

## 2022-11-15 DIAGNOSIS — E785 Hyperlipidemia, unspecified: Secondary | ICD-10-CM

## 2022-11-15 DIAGNOSIS — E1169 Type 2 diabetes mellitus with other specified complication: Secondary | ICD-10-CM | POA: Diagnosis not present

## 2022-11-15 DIAGNOSIS — N1831 Chronic kidney disease, stage 3a: Secondary | ICD-10-CM | POA: Diagnosis not present

## 2022-11-15 DIAGNOSIS — I10 Essential (primary) hypertension: Secondary | ICD-10-CM | POA: Diagnosis not present

## 2022-11-15 DIAGNOSIS — I2510 Atherosclerotic heart disease of native coronary artery without angina pectoris: Secondary | ICD-10-CM

## 2022-11-15 DIAGNOSIS — Z7984 Long term (current) use of oral hypoglycemic drugs: Secondary | ICD-10-CM

## 2022-11-15 NOTE — Patient Instructions (Signed)
Medication Instructions:  No changes   *If you need a refill on your cardiac medications before your next appointment, please call your pharmacy*   Lab Work: Not needed   Testing/Procedures: Not needed  Follow-Up: At [facility], you and your health needs are our priority.  As part of our continuing mission to provide you with exceptional heart care, we have created designated Provider Care Teams.  These Care Teams include your primary Cardiologist (physician) and Advanced Practice Providers (APPs -  Physician Assistants and Nurse Practitioners) who all work together to provide you with the care you need, when you need it.     Your next appointment:   12 month(s)  The format for your next appointment:   In Person  Provider:   Sarp Harding, MD    Other Instructions  

## 2022-11-15 NOTE — Progress Notes (Signed)
Primary Care Provider: Wanda Plump, MD [facility] Cardiologist: Bryan Lemma, MD Electrophysiologist: None  Referring Provider: Wanda Plump, MD  Clinic Note: Chief Complaint  Patient presents with   Follow-up    Doing well.  No complaints.   Coronary Artery Disease    No angina or heart failure    ===================================  ASSESSMENT/PLAN   Problem List Items Addressed This Visit       Cardiology Problems   Hyperlipidemia associated with type 2 diabetes mellitus (HCC) (Chronic)    Excellent control with 40 mg rosuvastatin. A1c was just checked-6.7.  I am confused of what medicines he is on, if Marcelline Deist is clearly listed.  But I was not sure if he remained on glipizide or not..  It does appear that he is still on Actos.  As noted, would prefer to avoid Actos based on concerns for volume.  With him being obese, I do think a GLP-1 agonist would be helpful.  He still has an A1c of greater than 6.5 and therefore would meet criteria for Mounjaro or Ozempic. => Defer to PCP.      Essential hypertension (Chronic)    Well-controlled BP on current meds.  No change.      Coronary artery calcification seen on computed tomography - Primary (Chronic)    Likely nonobstructive CAD based on lack of symptoms.   We discussed evaluation options-Coronary Calcium Score would help with risk ratification, but he is already taking a statin and blood pressure medications and diabetes medications along with aspirin.    He has declined Coronary CTA evaluation which is reasonable given the lack of symptoms.  Plan: Agree with aspirin 81 mg daily given risk factors of obesity, HTN, HLD and DM-2 (metabolic syndrome) Goal LDL less than 70, Continue rosuvastatin 40 mg daily with excellent lipid control GDMT for HTN and DM-2 targeting LDL less than 5.6. Carvedilol 25 mg bid along with amlodipine 5 mg daily and losartan 100 mg daily. Also on [place].           Other   CKD (chronic kidney disease) stage 3, GFR 30-59 ml/min (HCC) (Chronic)    Most recent creatinine is down to 1.6.  Stable.  Followed by nephrology who has started him on [place] and continued [place].  Appropriately on high-dose ARB with diabetes for renal protection.      ===================================  HPI:    Alex Phillips is a 62 y.o. male former heavy smoker (> 20 pk-yr) with a PMH notable for HTN, HLD, DM-2 with CKD 3 as well as elevated Coronary Calcium Score (reviewed below)  who presents today for 38-month follow-up.  Alex Phillips was last seen on May 17, 2022-I Reviewed His Echo and Myoview which was read as nonischemic (back in 2021).  He had been seen by Dr. Tomie China back in 2020-had an evaluation with echo and Myoview reviewed below.  There is discussion about Coronary CTA that he declined.  He did not want to take nitroglycerin or other medications and requested a different cardiologist.  I then saw him November 2023 in response to a visit with Dr. Drue Novel earlier that month.  He mentioned that he did not have any cardiac symptoms at all.  Interestingly, Dr. Leta Jungling note indicated that he exercise regularly, but to me he said he does not routinely exercise.  Previous deconditioned and therefore gets some exertional dyspnea.  This improved after quitting smoking in January 2023.  No real chest pain  or dyspnea on exertion.  No heart failure symptoms.  Has been having hypoglycemic issues with glipizide and if it was discontinued he was continued on Farxiga and Actos.  => During This Visit, He Indicated He Actually Seen [facility] in [facility] and they had recommended Coronary CTA as well.  He indicated with no symptoms, that he did not see what I would do that.  We therefore opted to check a coronary calcium score for baseline risk assessment.  Lipids are well-controlled-A1c was not quite controlled at 7.4.  We did review the imaging coronary calcium scoring  Coronary CTA and recommendations for either 1. Nephrology just switched him to [place]  Recent Hospitalizations: None  Recent PCP visit 09/06/2022 with Dr. Drue Novel: Noted that he is doing well.  No chest pain or dyspnea.  Remains sedentary.  Not interested in vigorous evaluation => A1c checked.  Pending results, plan would be to consider switching from betamethasone to GLP-1 agonist.  Noted that he quit smoking.  Reviewed  CV studies:    The following studies were reviewed today: (if available, images/films reviewed: From Epic Chart or Care Everywhere)  CT chest 07/05/2022: Aortic atherosclerosis, coronary artery sclerosis, emphysema.  Lung RADS 2 multiple bilateral axillary nodes.  Nonpathologic size.  Interval History:   Alex Phillips returns here today for 59-month follow-up still doing well.  No active cardiac symptoms.  No issues with any chest pain pressure or dyspnea at rest or exertion.  No PND, orthopnea edema.  No signs symptoms arrhythmia-rapid irregular heartbeat/palpitations.  He still continues to take Mag-Ox maybe a couple times a week for palpitations and they are stable.  Amazingly, he really did not have a lot of coughing or bronchitis/COPD type symptoms despite prolonged coughing.  CV Review of Symptoms (Summary):no chest pain or dyspnea on exertion negative for - edema, irregular heartbeat, orthopnea, palpitations, paroxysmal nocturnal dyspnea, rapid heart rate, shortness of breath, or syncope/near syncope or TIA/amaurosis fugax or claudication.  REVIEWED OF SYSTEMS   Review of Systems  Constitutional:  Negative for malaise/fatigue and weight loss.  HENT:  Negative for congestion.   Respiratory:  Positive for cough (Still has intermittent cough but not prominent.).   Gastrointestinal:  Negative for blood in stool.  Genitourinary:  Negative for hematuria.  Musculoskeletal:  Positive for joint pain. Negative for myalgias.  Neurological:  Negative for dizziness.   Psychiatric/Behavioral: Negative.      I have reviewed and (if needed) personally updated the patient's problem list, medications, allergies, past medical and surgical history, social and family history.   PAST MEDICAL HISTORY   Past Medical History:  Diagnosis Date   Bell's palsy    Diabetes mellitus 11/09   A1c- 6.2   DJD (degenerative joint disease)    l knee   EKG, abnormal 12-2007   Stress test (-)    Elevated PSA 2010   saw urology, PSA was reched and found to be normal. Bx was canceled, did get a u/s d/t michrohematuris- normal   Elevated WBC count    saw Dr.Enenever; likely benign, monitor   HTN (hypertension) 11/14/2006   Hyperlipidemia    Hypertension    Hypothyroidism     PAST SURGICAL HISTORY   Past Surgical History:  Procedure Laterality Date   MOUTH SURGERY     crown extention on right upper    TONSILLECTOMY AND ADENOIDECTOMY     TYMPANOSTOMY TUBE PLACEMENT     B as a child   VASECTOMY  02/2009  Myoview 01/21/2019: EF 60 to 65%.  LOW RISK.  Possible small area of mild ischemia in the mid and apical inferior wall. 2D Echo 03/30/2020: Normal LV size.  Sigmoid septum.  Normal EF-65 to 70%.  No segmental wall motion abnormality.  Trileaflet aortic valve with mild sclerosis but no stenosis.  Unable to assess RV pressure.   MEDICATIONS/ALLERGIES   Current Meds  Medication Sig   amLODipine (NORVASC) 10 MG tablet Take 5 mg by mouth daily.   aspirin 81 MG tablet Take 81 mg by mouth daily.   carvedilol (COREG) 25 MG tablet Take 1 tablet (25 mg total) by mouth 2 (two) times daily with a meal.   dapagliflozin propanediol (FARXIGA) 10 MG TABS tablet Take 1 tablet (10 mg total) by mouth daily.   glipiZIDE (GLUCOTROL XL) 2.5 MG 24 hr tablet Take 2 tablets (5 mg total) by mouth daily with breakfast.   KERENDIA 10 MG TABS Take 1 tablet by mouth daily.   levothyroxine (SYNTHROID) 137 MCG tablet Take 1 tablet (137 mcg total) by mouth daily before breakfast.   losartan  (COZAAR) 100 MG tablet Take 100 mg by mouth daily.   magnesium oxide (MAG-OX) 400 MG tablet Take 400 mg by mouth daily.   nitroGLYCERIN (NITROSTAT) 0.4 MG SL tablet Place 1 tablet (0.4 mg total) under the tongue every 5 (five) minutes as needed for chest pain (3 doses maximun).   pioglitazone (ACTOS) 30 MG tablet TAKE 1 TABLET(30 MG) BY MOUTH DAILY   rosuvastatin (CRESTOR) 40 MG tablet TAKE 1 TABLET(40 MG) BY MOUTH DAILY    Allergies  Allergen Reactions   Cefaclor Anaphylaxis and Swelling    Swelling of the throat   Chantix [Varenicline] Other (See Comments)    Aggression per Pt's wife   Amoxicillin-Pot Clavulanate Nausea Only, Swelling and Rash   Januvia [Sitagliptin] Rash    SOCIAL HISTORY/FAMILY HISTORY   Reviewed in Epic:  Pertinent findings:  Social History   Tobacco Use   Smoking status: Former    Packs/day: 1.50    Years: 34.00    Additional pack years: 0.00    Total pack years: 51.00    Types: Cigarettes    Start date: 08/30/1981    Quit date: 07/24/2021    Years since quitting: 1.3   Smokeless tobacco: Never   Tobacco comments:    Quit 06-2021  Vaping Use   Vaping Use: Never used  Substance Use Topics   Alcohol use: No    Alcohol/week: 0.0 standard drinks of alcohol    Comment: rare   Drug use: No   Social History   Social History Narrative   Household: wife, 5 step kids total , 2 at home  (one is Down syndrome)       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  Constitutional:      General: He is not in acute distress.    Appearance: Normal appearance. He is  obese. He is not ill-appearing or  toxic-appearing.     Comments: Relatively healthy.  Be well-nourished and well-groomed.  HENT:     Head: Normocephalic and atraumatic.  Neck:     Vascular: No carotid bruit.  Cardiovascular:     Rate and Rhythm: Normal rate and regular rhythm.     Pulses: Normal pulses.     Heart sounds: Normal heart sounds. No murmur (1/6 SEM RUSB.) heard.    No friction rub. No gallop.  Pulmonary:     Effort: Pulmonary effort is normal. No respiratory distress.     Breath sounds: Normal breath sounds. No wheezing, rhonchi or rales.  Chest:     Chest wall: No tenderness.  Musculoskeletal:        General: No swelling. Normal range of motion.     Cervical back: Normal range of motion and neck supple.  Lymphadenopathy:     Cervical: No cervical adenopathy.  Skin:    General: Skin is warm and dry.  Neurological:     General: No focal deficit present.     Mental Status: He is alert and oriented to person, place, and time.     Gait: Gait normal.  Psychiatric:        Mood and Affect: Mood normal.        Behavior: Behavior normal.        Thought Content: Thought content normal.        Judgment: Judgment normal.     Adult ECG Report Not checked  Recent Labs: Reviewed. Lab Results  Component Value Date   CHOL 121 09/06/2022   HDL 40.00 09/06/2022   LDLCALC 57 09/06/2022   TRIG 118.0 09/06/2022   CHOLHDL 3 09/06/2022   Lab Results  Component Value Date   CREATININE 1.6 (A) 11/13/2022   BUN 17 11/13/2022   NA 139 11/13/2022   K 4.6 11/13/2022   CL 101 11/13/2022   CO2 23 (A) 11/13/2022      Latest Ref Rng & Units 05/12/2022   12:00 AM 05/08/2022   11:44 AM 11/10/2021   12:00 AM  CBC  WBC 4.0 - 10.5 K/uL  8.6  8.8      Hemoglobin 13.5 - 17.5 14.8     15.2  15.8      Hematocrit 39.0 - 52.0 %  44.9  47      Platelets 150.0 - 400.0 K/uL  132.0  157         This result is from an external source.    Lab Results  Component Value Date   HGBA1C 6.7  (H) 09/06/2022   Lab Results  Component Value Date   TSH 0.17 (L) 09/06/2022    ================================================== I spent a total of 16 minutes with the patient spent in direct patient consultation.  Additional time spent with chart review  / charting (studies, outside notes, etc): 15 min Total Time: 231 min  Current medicines are reviewed at length with the patient today.  (+/- concerns) N/A  Notice: This dictation was prepared with Dragon dictation along with smart phrase technology. Any transcriptional errors that result from this process are unintentional and may not be corrected upon review.  Studies Ordered:   No orders of the defined types were placed in this encounter.  No orders of the defined types were placed in this encounter.   Patient Instructions / Medication Changes & Studies & Tests Ordered   Patient Instructions  Medication Instructions:   No changes  *If you need a refill on your  cardiac medications before your next appointment, please call your pharmacy*   Lab Work: Not needed    Testing/Procedures: Not needed   Follow-Up: At [facility], you and your health needs are our priority.  As part of our continuing mission to provide you with exceptional heart care, we have created designated Provider Care Teams.  These Care Teams include your primary Cardiologist (physician) and Advanced Practice Providers (APPs -  Physician Assistants and Nurse Practitioners) who all work together to provide you with the care you need, when you need it.     Your next appointment:   12 month(s)  The format for your next appointment:   In Person  Provider:   Bryan Lemma, MD    Other Instructions      Marykay Lex, MD, MS Bryan Lemma, M.D., M.S. Interventional Cardiologist  [facility]  Pager # [phone] Phone # [phone] [address] [place], [place] [address]   Thank you for choosing [facility] [place]!!

## 2022-11-21 ENCOUNTER — Encounter: Payer: Self-pay | Admitting: Internal Medicine

## 2022-11-28 ENCOUNTER — Encounter: Payer: Self-pay | Admitting: Internal Medicine

## 2022-12-03 ENCOUNTER — Encounter: Payer: Self-pay | Admitting: Cardiology

## 2022-12-03 NOTE — Assessment & Plan Note (Signed)
Well-controlled BP on current meds.  No change.

## 2022-12-03 NOTE — Assessment & Plan Note (Signed)
Excellent control with 40 mg rosuvastatin. A1c was just checked-6.7.  I am confused of what medicines he is on, if Marcelline Deist is clearly listed.  But I was not sure if he remained on glipizide or not..  It does appear that he is still on Actos.  As noted, would prefer to avoid Actos based on concerns for volume.  With him being obese, I do think a GLP-1 agonist would be helpful.  He still has an A1c of greater than 6.5 and therefore would meet criteria for Mounjaro or Ozempic. => Defer to PCP.

## 2022-12-03 NOTE — Assessment & Plan Note (Signed)
Likely nonobstructive CAD based on lack of symptoms.   We discussed evaluation options-Coronary Calcium Score would help with risk ratification, but he is already taking a statin and blood pressure medications and diabetes medications along with aspirin.    He has declined Coronary CTA evaluation which is reasonable given the lack of symptoms.  Plan: Agree with aspirin 81 mg daily given risk factors of obesity, HTN, HLD and DM-2 (metabolic syndrome) Goal LDL less than 70, Continue rosuvastatin 40 mg daily with excellent lipid control GDMT for HTN and DM-2 targeting LDL less than 5.6. Carvedilol 25 mg bid along with amlodipine 5 mg daily and losartan 100 mg daily. Also on [place].

## 2022-12-03 NOTE — Assessment & Plan Note (Signed)
Most recent creatinine is down to 1.6.  Stable.  Followed by nephrology who has started him on [place] and continued [place].  Appropriately on high-dose ARB with diabetes for renal protection.

## 2022-12-12 ENCOUNTER — Other Ambulatory Visit: Payer: Self-pay | Admitting: Internal Medicine

## 2023-02-08 ENCOUNTER — Encounter (INDEPENDENT_AMBULATORY_CARE_PROVIDER_SITE_OTHER): Payer: Self-pay

## 2023-03-09 ENCOUNTER — Encounter: Payer: Self-pay | Admitting: Internal Medicine

## 2023-03-09 ENCOUNTER — Ambulatory Visit: Payer: Commercial Managed Care - HMO | Admitting: Internal Medicine

## 2023-03-09 VITALS — BP 126/80 | HR 58 | Temp 97.7°F | Resp 16 | Ht 71.0 in | Wt 234.5 lb

## 2023-03-09 DIAGNOSIS — I10 Essential (primary) hypertension: Secondary | ICD-10-CM

## 2023-03-09 DIAGNOSIS — E1165 Type 2 diabetes mellitus with hyperglycemia: Secondary | ICD-10-CM

## 2023-03-09 DIAGNOSIS — E039 Hypothyroidism, unspecified: Secondary | ICD-10-CM | POA: Diagnosis not present

## 2023-03-09 DIAGNOSIS — Z794 Long term (current) use of insulin: Secondary | ICD-10-CM

## 2023-03-09 LAB — HEMOGLOBIN A1C: Hgb A1c MFr Bld: 6.7 % — ABNORMAL HIGH (ref 4.6–6.5)

## 2023-03-09 LAB — TSH: TSH: 81.21 u[IU]/mL — ABNORMAL HIGH (ref 0.35–5.50)

## 2023-03-09 NOTE — Patient Instructions (Addendum)
Vaccines I recommend: Flu shot this fall RSV vaccine COVID booster   Check the  blood pressure regularly Blood pressure goal:  between 110/65 and  135/85. If it is consistently higher or lower, let me know  Diabetes: You can check your sugars at different times, they right times to do it are: - early in AM fasting  ( blood sugar goal 70-130) - 2 hours after a meal (blood sugar goal less than 180) - bedtime (goal 90-150)    GO TO THE LAB : Get the blood work     Next visit with me by 08/2023 for CPX.  Sooner if needed.    Please schedule it at the front desk

## 2023-03-09 NOTE — Addendum Note (Signed)
Addended by: Mervin Kung A on: 03/09/2023 04:03 PM   Modules accepted: Orders

## 2023-03-09 NOTE — Assessment & Plan Note (Signed)
DM: A1c at the last visit was 6.7.  On Farxiga, glipizide, Actos.  Recommend ambulatory CBGs.  Cardiology note reviewed, they would like the patient to be on a GLP-1 rather than Actos however in the past he had to stop Victoza due to excessive weight loss.  No evidence of volume overload or fluid retention at this point.   Nevertheless, the patient would accept a rechallenge with a GLP-1 if necessary Check A1c. High cholesterol: Well-controlled on rosuvastatin 40 mg. Hypothyroidism: TSH back in March was 0.1, meds adjusted, good compliance, check a TSH CAD: 11/15/22 saw cards, they would prefer a GLP-1 over actos, see comments under DM CKD: LOV nephrology 11/2022, last creatinine stable. Social: Retired for several years and vaccine advice provided, he is reluctant to take any vaccines, benefits discussed. RTC 08-2023 CPX

## 2023-03-09 NOTE — Progress Notes (Signed)
Subjective:    Patient ID: Alex Phillips, male    DOB: 13-Apr-1961, 62 y.o.   MRN: [id number]  DOS:  03/09/2023 Type of visit - description: f/u  Since the last office visit is doing well. Saw cardiology, note reviewed. Denies chest pain, difficulty breathing.  No lower extremity edema.  No palpitations.  No lower extremity paresthesias.  Review of Systems See above   Past Medical History:  Diagnosis Date   Bell's palsy    Diabetes mellitus 11/09   A1c- 6.2   DJD (degenerative joint disease)    l knee   EKG, abnormal 12-2007   Stress test (-)    Elevated PSA 2010   saw urology, PSA was reched and found to be normal. Bx was canceled, did get a u/s d/t michrohematuris- normal   Elevated WBC count    saw Dr.Enenever; likely benign, monitor   HTN (hypertension) 11/14/2006   Hyperlipidemia    Hypertension    Hypothyroidism     Past Surgical History:  Procedure Laterality Date   MOUTH SURGERY     crown extention on right upper    TONSILLECTOMY AND ADENOIDECTOMY     TYMPANOSTOMY TUBE PLACEMENT     B as a child   VASECTOMY  02/2009    Current Outpatient Medications  Medication Instructions   amLODipine (NORVASC) 5 mg, Oral, Daily   aspirin 81 mg, Oral, Daily,     carvedilol (COREG) 25 mg, Oral, 2 times daily with meals   dapagliflozin propanediol (FARXIGA) 10 mg, Oral, Daily   glipiZIDE (GLUCOTROL XL) 5 mg, Oral, Daily with breakfast   KERENDIA 10 MG TABS 1 tablet, Oral, Daily   levothyroxine (SYNTHROID) 137 mcg, Oral, Daily before breakfast   losartan (COZAAR) 100 mg, Oral, Daily   magnesium oxide (MAG-OX) 400 mg, Oral, Daily   nitroGLYCERIN (NITROSTAT) 0.4 mg, Sublingual, Every 5 min PRN   pioglitazone (ACTOS) 30 MG tablet TAKE 1 TABLET(30 MG) BY MOUTH DAILY   rosuvastatin (CRESTOR) 40 MG tablet TAKE 1 TABLET(40 MG) BY MOUTH DAILY       Objective:   Physical Exam BP 126/80   Pulse (!) 58   Temp 97.7 F (36.5 C) (Oral)   Resp 16   Ht 5\' 11"  (1.803 m)   Wt  234 lb 8 oz (106.4 kg)   SpO2 94%   BMI 32.71 kg/m  General:   Well developed, NAD, BMI noted. HEENT:  Normocephalic . Face symmetric, atraumatic Lungs:  CTA B Normal respiratory effort, no intercostal retractions, no accessory muscle use. Heart: RRR,  no murmur.  DM foot exam: Good pedal pulses, no edema, pinprick examination negative. Skin: Not pale. Not jaundice Neurologic:  alert & oriented X3.  Speech normal, gait appropriate for age and unassisted Psych--  Cognition and judgment appear intact.  Cooperative with normal attention span and concentration.  Behavior appropriate. No anxious or depressed appearing.      Assessment     Assessment  DM: - Avoid metformin (CKD) - h/o excessive weight loss with Victoza HTN Hyperlipidemia Hypothyroidism CAD:  --cor Ca+ per CTCoronary calcifications,  stress test 01/20/2019: ischemic changes , declined  CTA coronary --See cardiology note 01-2019 COPD: per CT, former heavy smoker  CKD DJD Bell's palsy Hematology: --Leukocytosis, benign per Hem, monitoring --Erythrocytosis, JAK2 = variant of no clinical , per hematology 2023, f/u prn Quit Tobacco  06-2021  Pt is adopted  PLAN: DM: A1c at the last visit was 6.7.  On Farxiga, glipizide,  Actos.  Recommend ambulatory CBGs.  Cardiology note reviewed, they would like the patient to be on a GLP-1 rather than Actos however in the past he had to stop Victoza due to excessive weight loss.  No evidence of volume overload or fluid retention at this point.   Nevertheless, the patient would accept a rechallenge with a GLP-1 if necessary Check A1c. High cholesterol: Well-controlled on rosuvastatin 40 mg. Hypothyroidism: TSH back in March was 0.1, meds adjusted, good compliance, check a TSH CAD: 11/15/22 saw cards, they would prefer a GLP-1 over actos, see comments under DM CKD: LOV nephrology 11/2022, last creatinine stable. Social: Retired for several years and vaccine advice provided, he  is reluctant to take any vaccines, benefits discussed. RTC 08-2023 CPX

## 2023-03-10 ENCOUNTER — Encounter: Payer: Self-pay | Admitting: Internal Medicine

## 2023-03-10 DIAGNOSIS — E039 Hypothyroidism, unspecified: Secondary | ICD-10-CM

## 2023-03-10 LAB — MICROALBUMIN / CREATININE URINE RATIO
Creatinine, Urine: 60 mg/dL (ref 20–320)
Microalb Creat Ratio: 85 mg/g{creat} — ABNORMAL HIGH (ref <30)
Microalb, Ur: 5.1 mg/dL

## 2023-03-13 NOTE — Addendum Note (Signed)
Addended byConrad [place] D on: 03/13/2023 12:47 PM   Modules accepted: Orders

## 2023-03-13 NOTE — Telephone Encounter (Signed)
TSH ordered.

## 2023-03-13 NOTE — Telephone Encounter (Signed)
Enter an order TSH.  Dx  thyroid disease

## 2023-04-02 ENCOUNTER — Other Ambulatory Visit: Payer: Self-pay | Admitting: Family

## 2023-04-04 ENCOUNTER — Other Ambulatory Visit: Payer: Self-pay

## 2023-04-04 MED ORDER — ROSUVASTATIN CALCIUM 40 MG PO TABS: 40.0000 mg | ORAL_TABLET | Freq: Every day | ORAL | 1 refills | Status: DC

## 2023-04-05 ENCOUNTER — Other Ambulatory Visit (INDEPENDENT_AMBULATORY_CARE_PROVIDER_SITE_OTHER): Payer: Commercial Managed Care - HMO

## 2023-04-05 DIAGNOSIS — E039 Hypothyroidism, unspecified: Secondary | ICD-10-CM | POA: Diagnosis not present

## 2023-04-06 LAB — TSH: TSH: 2.14 u[IU]/mL (ref 0.35–5.50)

## 2023-04-21 ENCOUNTER — Other Ambulatory Visit: Payer: Self-pay | Admitting: Internal Medicine

## 2023-04-23 ENCOUNTER — Other Ambulatory Visit: Payer: Self-pay

## 2023-04-23 MED ORDER — PIOGLITAZONE HCL 30 MG PO TABS: 30.0000 mg | ORAL_TABLET | Freq: Every day | ORAL | 1 refills | Status: DC

## 2023-05-12 ENCOUNTER — Other Ambulatory Visit: Payer: Self-pay | Admitting: Internal Medicine

## 2023-06-07 ENCOUNTER — Encounter: Payer: Self-pay | Admitting: Acute Care

## 2023-06-11 ENCOUNTER — Other Ambulatory Visit: Payer: Self-pay | Admitting: Internal Medicine

## 2023-07-01 ENCOUNTER — Other Ambulatory Visit: Payer: Self-pay | Admitting: Internal Medicine

## 2023-07-03 ENCOUNTER — Other Ambulatory Visit: Payer: Self-pay

## 2023-07-03 MED ORDER — FARXIGA 10 MG PO TABS: 10.0000 mg | ORAL_TABLET | Freq: Every day | ORAL | 1 refills | Status: DC

## 2023-07-06 ENCOUNTER — Ambulatory Visit
Admission: RE | Admit: 2023-07-06 | Discharge: 2023-07-06 | Disposition: A | Discharge status: Home / Self Care | Payer: Commercial Managed Care - HMO | Source: Ambulatory Visit | Attending: Acute Care | Admitting: Acute Care

## 2023-07-06 DIAGNOSIS — Z122 Encounter for screening for malignant neoplasm of respiratory organs: Secondary | ICD-10-CM

## 2023-07-06 DIAGNOSIS — Z87891 Personal history of nicotine dependence: Secondary | ICD-10-CM

## 2023-07-17 ENCOUNTER — Other Ambulatory Visit: Payer: Self-pay

## 2023-07-17 DIAGNOSIS — F17210 Nicotine dependence, cigarettes, uncomplicated: Secondary | ICD-10-CM

## 2023-07-17 DIAGNOSIS — Z122 Encounter for screening for malignant neoplasm of respiratory organs: Secondary | ICD-10-CM

## 2023-07-17 DIAGNOSIS — Z87891 Personal history of nicotine dependence: Secondary | ICD-10-CM

## 2023-09-07 ENCOUNTER — Encounter: Payer: Self-pay | Admitting: Internal Medicine

## 2023-09-07 ENCOUNTER — Ambulatory Visit (INDEPENDENT_AMBULATORY_CARE_PROVIDER_SITE_OTHER): Payer: Commercial Managed Care - HMO | Admitting: Internal Medicine

## 2023-09-07 VITALS — BP 132/80 | HR 65 | Temp 97.8°F | Resp 18 | Ht 71.0 in | Wt 236.0 lb

## 2023-09-07 DIAGNOSIS — Z23 Encounter for immunization: Secondary | ICD-10-CM | POA: Diagnosis not present

## 2023-09-07 DIAGNOSIS — E1165 Type 2 diabetes mellitus with hyperglycemia: Secondary | ICD-10-CM | POA: Diagnosis not present

## 2023-09-07 DIAGNOSIS — N1831 Chronic kidney disease, stage 3a: Secondary | ICD-10-CM

## 2023-09-07 DIAGNOSIS — I10 Essential (primary) hypertension: Secondary | ICD-10-CM

## 2023-09-07 DIAGNOSIS — Z0000 Encounter for general adult medical examination without abnormal findings: Secondary | ICD-10-CM

## 2023-09-07 DIAGNOSIS — Z0001 Encounter for general adult medical examination with abnormal findings: Secondary | ICD-10-CM

## 2023-09-07 DIAGNOSIS — Z794 Long term (current) use of insulin: Secondary | ICD-10-CM

## 2023-09-07 DIAGNOSIS — Z1211 Encounter for screening for malignant neoplasm of colon: Secondary | ICD-10-CM

## 2023-09-07 DIAGNOSIS — E039 Hypothyroidism, unspecified: Secondary | ICD-10-CM

## 2023-09-07 DIAGNOSIS — E785 Hyperlipidemia, unspecified: Secondary | ICD-10-CM

## 2023-09-07 DIAGNOSIS — E1169 Type 2 diabetes mellitus with other specified complication: Secondary | ICD-10-CM

## 2023-09-07 NOTE — Progress Notes (Signed)
 Subjective:    Patient ID: Alex Phillips, male    DOB: [birth year], 63 y.o.   MRN: [id number]  DOS:  09/07/2023 Type of visit - description: cpx  Here for CPX. Chronic medical problems addressed. In general feels well other than occasional aches and pains. Specifically denies chest pain, difficulty breathing. No blood in the stools or in the urine.   Wt Readings from Last 3 Encounters:  09/07/23 236 lb (107 kg)  03/09/23 234 lb 8 oz (106.4 kg)  11/15/22 235 lb (106.6 kg)    Review of Systems  Other than above, a 14 point review of systems is negative    Past Medical History:  Diagnosis Date   Bell's palsy    Diabetes mellitus 11/09   A1c- 6.2   DJD (degenerative joint disease)    l knee   EKG, abnormal 12-2007   Stress test (-)    Elevated PSA 2010   saw urology, PSA was reched and found to be normal. Bx was canceled, did get a u/s d/t michrohematuris- normal   Elevated WBC count    saw Dr.Enenever; likely benign, monitor   HTN (hypertension) 11/14/2006   Hyperlipidemia    Hypertension    Hypothyroidism     Past Surgical History:  Procedure Laterality Date   MOUTH SURGERY     crown extention on right upper    TONSILLECTOMY AND ADENOIDECTOMY     TYMPANOSTOMY TUBE PLACEMENT     B as a child   VASECTOMY  02/2009   Social History   Socioeconomic History   Marital status: Married    Spouse name: Not on file   Number of children: 0   Years of education: Not on file   Highest education level: Not on file  Occupational History   Occupation: RETIRED Government social research officer   Occupation: Retired 03-2018  Tobacco Use   Smoking status: Former    Current packs/day: 0.00    Average packs/day: 1.5 packs/day for 39.9 years (59.8 ttl pk-yrs)    Types: Cigarettes    Start date: 08/30/1981    Quit date: 07/24/2021    Years since quitting: 2.1   Smokeless tobacco: Never   Tobacco comments:    Quit 06-2021  Vaping Use   Vaping status: Never Used  Substance  and Sexual Activity   Alcohol use: No    Alcohol/week: 0.0 standard drinks of alcohol    Comment: rare   Drug use: No   Sexual activity: Not on file  Other Topics Concern   Not on file  Social History Narrative   Household: wife, 5 step kids total , 2 at home  (one is Down syndrome)       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    Works in Data processing manager for warehouses, 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[employer], [place]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   Social Drivers of Corporate investment banker Strain: Not on file  Food Insecurity: Not on file  Transportation Needs: Not on file  Physical Activity: Not on file  Stress: Not on file  Social Connections: Not on file  Intimate Partner Violence: Not on file     Current Outpatient Medications  Medication Instructions   amLODipine (NORVASC) 5 mg, Daily   aspirin 81 mg, Daily   carvedilol (COREG) 25 mg, Oral, 2 times daily with meals   Farxiga 10 mg, Oral, Daily   glipiZIDE (GLUCOTROL XL) 5 mg, Oral, Daily with breakfast   KERENDIA 10 MG TABS  1 tablet, Daily   levothyroxine (SYNTHROID) 137 mcg, Oral, Daily before breakfast   losartan (COZAAR) 100 mg, Daily   nitroGLYCERIN (NITROSTAT) 0.4 mg, Sublingual, Every 5 min PRN   pioglitazone (ACTOS) 30 mg, Oral, Daily   rosuvastatin (CRESTOR) 40 mg, Oral, Daily       Objective:   Physical Exam BP 132/80   Pulse 65   Temp 97.8 F (36.6 C) (Oral)   Resp 18   Ht 5\' 11"  (1.803 m)   Wt 236 lb (107 kg)   SpO2 96%   BMI 32.92 kg/m  General: Well developed, NAD, BMI noted Neck: No  thyromegaly  HEENT:  Normocephalic . Face symmetric, atraumatic Lungs:  CTA B Normal respiratory effort, no intercostal retractions, no accessory muscle use. Heart: RRR,  no murmur.  Abdomen:  Not distended, soft, non-tender. No rebound or rigidity.  Umbilical hernia noted. Lower extremities: no pretibial edema bilaterally  Skin: Exposed areas without rash. Not pale. Not jaundice Neurologic:  alert & oriented X3.  Speech  normal, gait appropriate for age and unassisted Strength symmetric and appropriate for age.  Psych: Cognition and judgment appear intact.  Cooperative with normal attention span and concentration.  Behavior appropriate. No anxious or depressed appearing.     Assessment     Assessment  DM: - Avoid metformin (CKD) - h/o excessive weight loss with Victoza HTN Hyperlipidemia Hypothyroidism CAD:  --cor Ca+ per CTCoronary calcifications,  stress test 01/20/2019: ischemic changes , declined  CTA coronary --See cardiology note 01-2019 COPD: per CT, former heavy smoker  CKD DJD Bell's palsy Hematology: --Leukocytosis, benign per Hem, monitoring --Erythrocytosis, JAK2 = variant of no clinical , per hematology 2023, f/u prn Quit Tobacco  06-2021  Pt is adopted  PLAN: Here for CPX -Tdap 09/07/2023 -prevnar- 2016; PNM 23:  2014 - shingrex x2 -Has consistently declined COVID or flu shots. -- CCS 12-2003: Cscope , TICs, hyperplastic polyps  08-2013 Cscope no polyps, 10 years. Referred to GI -- Prostate cancer screening: Unknown FH, no symptoms, check PSA.  - Lifestyle:  Strongly encouraged to increase physical activity, goal 30 minutes  daily. Watch diet, watch portions, decrease carbohydrates. -Tobacco abuse: Quit more than a year ago. - lung cancer screening: Next CT due 06-2024 -Labs: CMP FLP CBC A1c TSH PSA - Healthcare POA: See AVS We also address the following: DM: On Farxiga: Glucotrol, pioglitazone.  Checking A1c.  Lifestyle extensively discussed HTN: BP satisfactory, continue amlodipine, carvedilol, losartan.  Labs. High cholesterol: On Crestor, checking labs. CAD: Denies symptoms. Hypothyroidism: Good compliance, last TSH satisfactory, recheck. CKD stage III: LOV renal June 2024, next visit June 2025 RTC 4 months.

## 2023-09-07 NOTE — Patient Instructions (Addendum)
  Check the  blood pressure regularly Blood pressure goal:  between 110/65 and  135/85. If it is consistently higher or lower, let me know     GO TO THE LAB : Get the blood work     Please go to the front desk: Arrange for a follow-up in 4 months        "Health Care Power of attorney" (Also know as a  "Living will" or  Advance care planning documents)  If you already have a living will or healthcare power of attorney, is recommended you bring the copy to be scanned in your chart.   The document will be available to all the doctors you see in the system.  If you are over 55 y/o and don't have the document, please read:  Advance care planning is a process that supports adults in  understanding and sharing their preferences regarding future medical care.  The patient's preferences are recorded in documents called Advance Directives and the can be modified at any time while the patient is in full mental capacity.     More information at: [email or web address]

## 2023-09-08 ENCOUNTER — Encounter: Payer: Self-pay | Admitting: Internal Medicine

## 2023-09-08 LAB — CBC WITH DIFFERENTIAL/PLATELET
Absolute Lymphocytes: 1435 {cells}/uL (ref 850–3900)
Absolute Monocytes: 435 {cells}/uL (ref 200–950)
Basophils Absolute: 82 {cells}/uL (ref 0–200)
Basophils Relative: 1.2 %
Eosinophils Absolute: 109 {cells}/uL (ref 15–500)
Eosinophils Relative: 1.6 %
HCT: 45.6 % (ref 38.5–50.0)
Hemoglobin: 15.6 g/dL (ref 13.2–17.1)
MCH: 32.9 pg (ref 27.0–33.0)
MCHC: 34.2 g/dL (ref 32.0–36.0)
MCV: 96.2 fL (ref 80.0–100.0)
MPV: 11.8 fL (ref 7.5–12.5)
Monocytes Relative: 6.4 %
Neutro Abs: 4740 {cells}/uL (ref 1500–7800)
Neutrophils Relative %: 69.7 %
Platelets: 118 10*3/uL — ABNORMAL LOW (ref 140–400)
RBC: 4.74 10*6/uL (ref 4.20–5.80)
RDW: 12.4 % (ref 11.0–15.0)
Total Lymphocyte: 21.1 %
WBC: 6.8 10*3/uL (ref 3.8–10.8)

## 2023-09-08 LAB — COMPREHENSIVE METABOLIC PANEL
AG Ratio: 1.6 (calc) (ref 1.0–2.5)
ALT: 16 U/L (ref 9–46)
AST: 17 U/L (ref 10–35)
Albumin: 4.1 g/dL (ref 3.6–5.1)
Alkaline phosphatase (APISO): 58 U/L (ref 35–144)
BUN/Creatinine Ratio: 8 (calc) (ref 6–22)
BUN: 14 mg/dL (ref 7–25)
CO2: 24 mmol/L (ref 20–32)
Calcium: 9 mg/dL (ref 8.6–10.3)
Chloride: 105 mmol/L (ref 98–110)
Creat: 1.66 mg/dL — ABNORMAL HIGH (ref 0.70–1.35)
Globulin: 2.6 g/dL (ref 1.9–3.7)
Glucose, Bld: 118 mg/dL — ABNORMAL HIGH (ref 65–99)
Potassium: 4.9 mmol/L (ref 3.5–5.3)
Sodium: 138 mmol/L (ref 135–146)
Total Bilirubin: 0.6 mg/dL (ref 0.2–1.2)
Total Protein: 6.7 g/dL (ref 6.1–8.1)

## 2023-09-08 LAB — LIPID PANEL
Cholesterol: 123 mg/dL (ref <200)
HDL: 46 mg/dL (ref >=40)
LDL Cholesterol (Calc): 58 mg/dL
Non-HDL Cholesterol (Calc): 77 mg/dL (ref <130)
Total CHOL/HDL Ratio: 2.7 (calc) (ref <5.0)
Triglycerides: 102 mg/dL (ref <150)

## 2023-09-08 LAB — TSH: TSH: 1.53 m[IU]/L (ref 0.40–4.50)

## 2023-09-08 LAB — HEMOGLOBIN A1C
Hgb A1c MFr Bld: 6.5 %{Hb} — ABNORMAL HIGH (ref <5.7)
Mean Plasma Glucose: 140 mg/dL
eAG (mmol/L): 7.7 mmol/L

## 2023-09-08 LAB — PSA: PSA: 0.81 ng/mL (ref <=4.00)

## 2023-09-08 NOTE — Assessment & Plan Note (Signed)
 Here for CPX  We also address the following: DM: On Farxiga: Glucotrol, pioglitazone.  Checking A1c.  Lifestyle extensively discussed HTN: BP satisfactory, continue amlodipine, carvedilol, losartan.  Labs. High cholesterol: On Crestor, checking labs. CAD: Denies symptoms. Hypothyroidism: Good compliance, last TSH satisfactory, recheck. CKD stage III: LOV renal June 2024, next visit June 2025 RTC 4 months.

## 2023-09-08 NOTE — Assessment & Plan Note (Signed)
 Here for CPX -Tdap 09/07/2023 -prevnar- 2016; PNM 23:  2014 - shingrex x2 -Has consistently declined COVID or flu shots. -- CCS 12-2003: Cscope , TICs, hyperplastic polyps  08-2013 Cscope no polyps, 10 years. Referred to GI -- Prostate cancer screening: Unknown FH, no symptoms, check PSA.  - Lifestyle:  Strongly encouraged to increase physical activity, goal 30 minutes  daily. Watch diet, watch portions, decrease carbohydrates. -Tobacco abuse: Quit more than a year ago. - lung cancer screening: Next CT due 06-2024 -Labs: CMP FLP CBC A1c TSH PSA - Healthcare POA: See AVS

## 2023-09-12 ENCOUNTER — Encounter: Payer: Self-pay | Admitting: Internal Medicine

## 2023-09-26 ENCOUNTER — Encounter: Payer: Self-pay | Admitting: Internal Medicine

## 2023-10-16 ENCOUNTER — Other Ambulatory Visit: Payer: Self-pay | Admitting: Internal Medicine

## 2023-10-24 ENCOUNTER — Ambulatory Visit (AMBULATORY_SURGERY_CENTER)

## 2023-10-24 VITALS — Ht 71.0 in | Wt 240.0 lb

## 2023-10-24 DIAGNOSIS — Z1211 Encounter for screening for malignant neoplasm of colon: Secondary | ICD-10-CM

## 2023-10-24 MED ORDER — NA SULFATE-K SULFATE-MG SULF 17.5-3.13-1.6 GM/177ML PO SOLN: 1.0000 | Freq: Once | ORAL | 0 refills | Status: AC

## 2023-10-24 NOTE — Progress Notes (Signed)

## 2023-10-30 ENCOUNTER — Encounter: Payer: Self-pay | Admitting: Internal Medicine

## 2023-11-05 ENCOUNTER — Encounter (HOSPITAL_COMMUNITY): Payer: Self-pay

## 2023-11-08 LAB — HM DIABETES EYE EXAM

## 2023-11-12 ENCOUNTER — Encounter: Payer: Self-pay | Admitting: Internal Medicine

## 2023-11-12 ENCOUNTER — Ambulatory Visit: Admitting: Internal Medicine

## 2023-11-12 VITALS — BP 128/93 | HR 55 | Temp 97.9°F | Resp 20 | Ht 71.0 in | Wt 240.0 lb

## 2023-11-12 DIAGNOSIS — K5730 Diverticulosis of large intestine without perforation or abscess without bleeding: Secondary | ICD-10-CM

## 2023-11-12 DIAGNOSIS — K635 Polyp of colon: Secondary | ICD-10-CM | POA: Diagnosis not present

## 2023-11-12 DIAGNOSIS — Z1211 Encounter for screening for malignant neoplasm of colon: Secondary | ICD-10-CM | POA: Diagnosis present

## 2023-11-12 DIAGNOSIS — D122 Benign neoplasm of ascending colon: Secondary | ICD-10-CM | POA: Diagnosis not present

## 2023-11-12 DIAGNOSIS — D123 Benign neoplasm of transverse colon: Secondary | ICD-10-CM

## 2023-11-12 DIAGNOSIS — K5520 Angiodysplasia of colon without hemorrhage: Secondary | ICD-10-CM

## 2023-11-12 DIAGNOSIS — D124 Benign neoplasm of descending colon: Secondary | ICD-10-CM

## 2023-11-12 DIAGNOSIS — D120 Benign neoplasm of cecum: Secondary | ICD-10-CM

## 2023-11-12 MED ORDER — SODIUM CHLORIDE 0.9 % IV SOLN: 500.0000 mL | Freq: Once | INTRAVENOUS | Status: DC

## 2023-11-12 NOTE — Progress Notes (Signed)
 Sedate, gd SR, tolerated procedure well, VSS, report to RN

## 2023-11-12 NOTE — Op Note (Signed)
 [facility] Patient Name: Alex Phillips Procedure Date: 11/12/2023 4:10 PM MRN: [id number] Endoscopist: Murel Arlington. Elvin Hammer , MD, [id number] Age: 62 Referring MD:  Date of Birth: 1961-01-22 Gender: Male Account #: [id number] Procedure:                Colonoscopy with cold snare polypectomy x 7; biopsy                            polypectomy x 1 Indications:              Screening for colorectal malignant neoplasm Medicines:                Monitored Anesthesia Care Procedure:                Pre-Anesthesia Assessment:                           - Prior to the procedure, a History and Physical                            was performed, and patient medications and                            allergies were reviewed. The patient's tolerance of                            previous anesthesia was also reviewed. The risks                            and benefits of the procedure and the sedation                            options and risks were discussed with the patient.                            All questions were answered, and informed consent                            was obtained. Prior Anticoagulants: The patient has                            taken no anticoagulant or antiplatelet agents. ASA                            Grade Assessment: II - A patient with mild systemic                            disease. After reviewing the risks and benefits,                            the patient was deemed in satisfactory condition to                            undergo the procedure.  After obtaining informed consent, the colonoscope                            was passed under direct vision. Throughout the                            procedure, the patient's blood pressure, pulse, and                            oxygen saturations were monitored continuously. The                            CF [id number] #[id number] was introduced through the anus                            and  advanced to the the cecum, identified by                            appendiceal orifice and ileocecal valve. The                            ileocecal valve, appendiceal orifice, and rectum                            were photographed. The quality of the bowel                            preparation was excellent. The colonoscopy was                            performed without difficulty. The patient tolerated                            the procedure well. The bowel preparation used was                            SUPREP via split dose instruction. Scope In: 4:22:45 PM Scope Out: 4:45:25 PM Scope Withdrawal Time: 0 hours 15 minutes 57 seconds  Total Procedure Duration: 0 hours 22 minutes 40 seconds  Findings:                 Seven polyps were found in the transverse colon,                            ascending colon and cecum. The polyps were 3 to 6                            mm in size. These polyps were removed with a cold                            snare. Resection and retrieval were complete.                           A 1 mm  polyp was found in the descending colon. The                            polyp was removed with a jumbo cold forceps.                            Resection and retrieval were complete.                           Multiple diverticula were found in the left colon                            and right colon. Nonbleeding right colon AVM.                           The exam was otherwise without abnormality on                            direct and retroflexion views. Complications:            No immediate complications. Estimated blood loss:                            None. Estimated Blood Loss:     Estimated blood loss: none. Impression:               - Seven 3 to 6 mm polyps in the transverse colon,                            in the ascending colon and in the cecum, removed                            with a cold snare. Resected and retrieved.                           - One  1 mm polyp in the descending colon, removed                            with a jumbo cold forceps. Resected and retrieved.                           - Diverticulosis in the left colon and in the right                            colon. Right colon AVM.                           - The examination was otherwise normal on direct                            and retroflexion views. Recommendation:           - Repeat colonoscopy in 3 years for surveillance.                           -  Patient has a contact number available for                            emergencies. The signs and symptoms of potential                            delayed complications were discussed with the                            patient. Return to normal activities tomorrow.                            Written discharge instructions were provided to the                            patient.                           - Resume previous diet.                           - Continue present medications.                           - Await pathology results. Murel Arlington. Elvin Hammer, MD 11/12/2023 4:52:34 PM This report has been signed electronically.

## 2023-11-12 NOTE — Progress Notes (Signed)
 VS by DT  Pt's states no medical or surgical changes since previsit or office visit.

## 2023-11-12 NOTE — Progress Notes (Signed)
 HISTORY OF PRESENT ILLNESS:  Alex Phillips is a 63 y.o. male who presents for routine screening colonoscopy.  Previous examinations 2005 and 2015 were negative for neoplasia  REVIEW OF SYSTEMS:  All non-GI ROS negative except for  Past Medical History:  Diagnosis Date   Bell's palsy    Diabetes mellitus 11/09   A1c- 6.2   DJD (degenerative joint disease)    l knee   EKG, abnormal 12-2007   Stress test (-)    Elevated PSA 2010   saw urology, PSA was reched and found to be normal. Bx was canceled, did get a u/s d/t michrohematuris- normal   Elevated WBC count    saw Dr.Enenever; likely benign, monitor   HTN (hypertension) 11/14/2006   Hyperlipidemia    Hypertension    Hypothyroidism     Past Surgical History:  Procedure Laterality Date   MOUTH SURGERY     crown extention on right upper    TONSILLECTOMY AND ADENOIDECTOMY     TYMPANOSTOMY TUBE PLACEMENT     B as a child   VASECTOMY  02/2009    Social History Alex Phillips  reports that he quit smoking about 2 years ago. His smoking use included cigarettes. He started smoking about 42 years ago. He has a 59.8 pack-year smoking history. He has never used smokeless tobacco. He reports that he does not drink alcohol and does not use drugs.  family history includes Dementia in his mother. He was adopted.  Allergies  Allergen Reactions   Cefaclor Anaphylaxis and Swelling    Swelling of the throat   Chantix  [Varenicline ] Other (See Comments)    Aggression per Pt's wife   Amoxicillin-Pot Clavulanate Nausea Only, Swelling and Rash   Januvia  [Sitagliptin ] Rash       PHYSICAL EXAMINATION: Vital signs: BP (!) 140/99   Pulse 73   Temp 97.9 F (36.6 C) (Skin)   Ht 5\' 11"  (1.803 m)   Wt 240 lb (108.9 kg)   SpO2 95%   BMI 33.47 kg/m  General: Well-developed, well-nourished, no acute distress HEENT: Sclerae are anicteric, conjunctiva pink. Oral mucosa intact Lungs: Clear Heart: Regular Abdomen: soft, nontender,  nondistended, no obvious ascites, no peritoneal signs, normal bowel sounds. No organomegaly. Extremities: No edema Psychiatric: alert and oriented x3. Cooperative     ASSESSMENT:  Colon cancer screening   PLAN:  Screening colonoscopy

## 2023-11-12 NOTE — Progress Notes (Signed)
 Called to room to assist during endoscopic procedure.  Patient ID and intended procedure confirmed with present staff. Received instructions for my participation in the procedure from the performing physician.

## 2023-11-12 NOTE — Patient Instructions (Signed)
Discharge instructions given. Handouts on polyps and Diverticulosis. Resume previous medications. YOU HAD AN ENDOSCOPIC PROCEDURE TODAY AT THE [facility]:   Refer to the procedure report that was given to you for any specific questions about what was found during the examination.  If the procedure report does not answer your questions, please call your gastroenterologist to clarify.  If you requested that your care partner not be given the details of your procedure findings, then the procedure report has been included in a sealed envelope for you to review at your convenience later.  YOU SHOULD EXPECT: Some feelings of bloating in the abdomen. Passage of more gas than usual.  Walking can help get rid of the air that was put into your GI tract during the procedure and reduce the bloating. If you had a lower endoscopy (such as a colonoscopy or flexible sigmoidoscopy) you may notice spotting of blood in your stool or on the toilet paper. If you underwent a bowel prep for your procedure, you may not have a normal bowel movement for a few days.  Please Note:  You might notice some irritation and congestion in your nose or some drainage.  This is from the oxygen used during your procedure.  There is no need for concern and it should clear up in a day or so.  SYMPTOMS TO REPORT IMMEDIATELY:  Following lower endoscopy (colonoscopy or flexible sigmoidoscopy):  Excessive amounts of blood in the stool  Significant tenderness or worsening of abdominal pains  Swelling of the abdomen that is new, acute  Fever of 100F or higher  For urgent or emergent issues, a gastroenterologist can be reached at any hour by calling [phone]. Do not use MyChart messaging for urgent concerns.    DIET:  We do recommend a small meal at first, but then you may proceed to your regular diet.  Drink plenty of fluids but you should avoid alcoholic beverages for 24 hours.  ACTIVITY:  You should plan to take it  easy for the rest of today and you should NOT DRIVE or use heavy machinery until tomorrow (because of the sedation medicines used during the test).    FOLLOW UP: Our staff will call the number listed on your records the next business day following your procedure.  We will call around 7:15- 8:00 am to check on you and address any questions or concerns that you may have regarding the information given to you following your procedure. If we do not reach you, we will leave a message.     If any biopsies were taken you will be contacted by phone or by letter within the next 1-3 weeks.  Please call us at [phone] if you have not heard about the biopsies in 3 weeks.    SIGNATURES/CONFIDENTIALITY: You and/or your care partner have signed paperwork which will be entered into your electronic medical record.  These signatures attest to the fact that that the information above on your After Visit Summary has been reviewed and is understood.  Full responsibility of the confidentiality of this discharge information lies with you and/or your care-partner. 

## 2023-11-13 ENCOUNTER — Other Ambulatory Visit: Payer: Self-pay | Admitting: Internal Medicine

## 2023-11-13 ENCOUNTER — Telehealth: Payer: Self-pay | Admitting: *Deleted

## 2023-11-13 NOTE — Telephone Encounter (Signed)
 No answer on  follow up call. Left message.

## 2023-11-15 ENCOUNTER — Ambulatory Visit: Payer: Self-pay | Admitting: Internal Medicine

## 2023-11-15 LAB — SURGICAL PATHOLOGY

## 2024-01-05 ENCOUNTER — Other Ambulatory Visit: Payer: Self-pay | Admitting: Internal Medicine

## 2024-01-07 ENCOUNTER — Encounter: Payer: Self-pay | Admitting: Internal Medicine

## 2024-01-07 ENCOUNTER — Ambulatory Visit: Admitting: Internal Medicine

## 2024-01-11 ENCOUNTER — Ambulatory Visit: Admitting: Internal Medicine

## 2024-01-11 ENCOUNTER — Encounter: Payer: Self-pay | Admitting: Internal Medicine

## 2024-01-11 VITALS — BP 136/68 | HR 53 | Temp 98.0°F | Resp 16 | Ht 71.0 in | Wt 243.4 lb

## 2024-01-11 DIAGNOSIS — E039 Hypothyroidism, unspecified: Secondary | ICD-10-CM | POA: Diagnosis not present

## 2024-01-11 DIAGNOSIS — E1169 Type 2 diabetes mellitus with other specified complication: Secondary | ICD-10-CM

## 2024-01-11 DIAGNOSIS — N1831 Chronic kidney disease, stage 3a: Secondary | ICD-10-CM

## 2024-01-11 DIAGNOSIS — Z23 Encounter for immunization: Secondary | ICD-10-CM

## 2024-01-11 DIAGNOSIS — E1165 Type 2 diabetes mellitus with hyperglycemia: Secondary | ICD-10-CM | POA: Diagnosis not present

## 2024-01-11 DIAGNOSIS — Z794 Long term (current) use of insulin: Secondary | ICD-10-CM

## 2024-01-11 DIAGNOSIS — I10 Essential (primary) hypertension: Secondary | ICD-10-CM | POA: Diagnosis not present

## 2024-01-11 NOTE — Patient Instructions (Addendum)
 Diabetes: You can check your sugars at different times  - early in AM fasting  ( blood sugar goal 70-130) - 2 hours after a meal (blood sugar goal less than 180)   Recommend flu shot this fall.  Consider COVID booster    GO TO THE LAB :  Get the blood work   Your results will be posted on MyChart with my comments  Next office visit for a checkup in 4 to 5 months Please make an appointment before you leave today

## 2024-01-11 NOTE — Progress Notes (Signed)
 Subjective:    Patient ID: Alex Phillips, male    DOB: [birth year], 63 y.o.   MRN: [id number]  DOS:  01/11/2024 Type of visit - description: Routine visit  Chronic medical problems addressed. Good med compliance. Denies chest pain or difficulty breathing. No nausea vomiting.  No diarrhea.  Wt Readings from Last 3 Encounters:  01/11/24 243 lb 6 oz (110.4 kg)  11/12/23 240 lb (108.9 kg)  10/24/23 240 lb (108.9 kg)     Review of Systems See above   Past Medical History:  Diagnosis Date   Bell's palsy    Diabetes mellitus 11/09   A1c- 6.2   DJD (degenerative joint disease)    l knee   EKG, abnormal 12-2007   Stress test (-)    Elevated PSA 2010   saw urology, PSA was reched and found to be normal. Bx was canceled, did get a u/s d/t michrohematuris- normal   Elevated WBC count    saw Dr.Enenever; likely benign, monitor   HTN (hypertension) 11/14/2006   Hyperlipidemia    Hypertension    Hypothyroidism     Past Surgical History:  Procedure Laterality Date   MOUTH SURGERY     crown extention on right upper    TONSILLECTOMY AND ADENOIDECTOMY     TYMPANOSTOMY TUBE PLACEMENT     B as a child   VASECTOMY  02/2009    Current Outpatient Medications  Medication Instructions   amLODipine  (NORVASC ) 5 mg, Daily   aspirin 81 mg, Daily   carvedilol  (COREG ) 25 mg, Oral, 2 times daily with meals   Farxiga  10 mg, Oral, Daily   glipiZIDE  (GLUCOTROL  XL) 5 mg, Oral, Daily with breakfast   KERENDIA 10 MG TABS 1 tablet, Daily   levothyroxine  (SYNTHROID ) 137 mcg, Oral, Daily before breakfast   losartan  (COZAAR ) 100 mg, Daily   nitroGLYCERIN  (NITROSTAT ) 0.4 mg, Sublingual, Every 5 min PRN   pioglitazone  (ACTOS ) 30 mg, Oral, Daily   rosuvastatin  (CRESTOR ) 40 mg, Oral, Daily       Objective:   Physical Exam BP 136/68   Pulse (!) 53   Temp 98 F (36.7 C) (Oral)   Resp 16   Ht 5' 11 (1.803 m)   Wt 243 lb 6 oz (110.4 kg)   SpO2 96%   BMI 33.94 kg/m  General:   Well  developed, NAD, BMI noted.  HEENT:  Normocephalic . Face symmetric, atraumatic Lungs:  CTA B Normal respiratory effort, no intercostal retractions, no accessory muscle use. Heart: RRR,  no murmur.  Abdomen:  Not distended, soft, non-tender. No rebound or rigidity.   Skin: Not pale. Not jaundice Lower extremities: no pretibial edema bilaterally  Neurologic:  alert & oriented X3.  Speech normal, gait appropriate for age and unassisted Psych--  Cognition and judgment appear intact.  Cooperative with normal attention span and concentration.  Behavior appropriate. No anxious or depressed appearing.     Assessment     Assessment  DM: - Avoid metformin  (CKD) - h/o excessive weight loss with Victoza  HTN Hyperlipidemia Hypothyroidism CAD:  --cor Ca+ per CTCoronary calcifications,  stress test 01/20/2019: ischemic changes , declined  CTA coronary --See cardiology note 01-2019 COPD: per CT, former heavy smoker  CKD DJD Bell's palsy Hematology: --Leukocytosis, benign per Hem, monitoring --Erythrocytosis, JAK2 = variant of no clinical , per hematology 2023, f/u prn Quit Tobacco  06-2021  Pt is adopted  PLAN: DM: Last A1c 6.5.  On Farxiga , glipizide , pioglitazone .  We talk about  possibly changing glipizide  to another medication but he is happy with his A1c and would not like to change unless  necessary. We went over low CBGs symptoms, recommend to check CBGs regularly, carry glucose with him in case he has hypoglycemia symptoms.  Check A1c, micro. HTN: BP looks good, he has stopped checking ambulatory BPs long ago because they are always good.  Continue amlodipine , carvedilol , Kerendia, losartan . CKD, proteinuria: LOV nephrology May 2025, felt to be stable, was rec to continue Farxiga , finerenone day and losartan .  Check BMP Hypothyroidism: Check TSH Preventive care: PNM 20 today, recommend flu shot every fall, COVID booster is an option RTC routine checkup 4 to 5 months

## 2024-01-12 LAB — BASIC METABOLIC PANEL WITH GFR
BUN/Creatinine Ratio: 14 (calc) (ref 6–22)
BUN: 22 mg/dL (ref 7–25)
CO2: 25 mmol/L (ref 20–32)
Calcium: 9.3 mg/dL (ref 8.6–10.3)
Chloride: 106 mmol/L (ref 98–110)
Creat: 1.56 mg/dL — ABNORMAL HIGH (ref 0.70–1.35)
Glucose, Bld: 111 mg/dL — ABNORMAL HIGH (ref 65–99)
Potassium: 5 mmol/L (ref 3.5–5.3)
Sodium: 138 mmol/L (ref 135–146)
eGFR: 50 mL/min/1.73m2 — ABNORMAL LOW (ref >=60)

## 2024-01-12 LAB — TSH: TSH: 0.75 m[IU]/L (ref 0.40–4.50)

## 2024-01-12 LAB — HEMOGLOBIN A1C
Hgb A1c MFr Bld: 6.4 % — ABNORMAL HIGH (ref <5.7)
Mean Plasma Glucose: 137 mg/dL
eAG (mmol/L): 7.6 mmol/L

## 2024-01-12 LAB — MICROALBUMIN / CREATININE URINE RATIO
Creatinine, Urine: 43 mg/dL (ref 20–320)
Microalb Creat Ratio: 163 mg/g{creat} — ABNORMAL HIGH (ref <30)
Microalb, Ur: 7 mg/dL

## 2024-01-12 NOTE — Assessment & Plan Note (Signed)
 DM: Last A1c 6.5.  On Farxiga , glipizide , pioglitazone .  We talk about possibly changing glipizide  to another medication but he is happy with his A1c and would not like to change unless  necessary. We went over low CBGs symptoms, recommend to check CBGs regularly, carry glucose with him in case he has hypoglycemia symptoms.  Check A1c, micro. HTN: BP looks good, he has stopped checking ambulatory BPs long ago because they are always good.  Continue amlodipine , carvedilol , Kerendia, losartan . CKD, proteinuria: LOV nephrology May 2025, felt to be stable, was rec to continue Farxiga , finerenone day and losartan .  Check BMP Hypothyroidism: Check TSH Preventive care: PNM 20 today, recommend flu shot every fall, COVID booster is an option RTC routine checkup 4 to 5 months

## 2024-01-15 ENCOUNTER — Ambulatory Visit: Payer: Self-pay | Admitting: Internal Medicine

## 2024-04-29 ENCOUNTER — Other Ambulatory Visit: Payer: Self-pay | Admitting: Internal Medicine

## 2024-05-19 ENCOUNTER — Other Ambulatory Visit: Payer: Self-pay | Admitting: Internal Medicine

## 2024-07-07 ENCOUNTER — Inpatient Hospital Stay
Admission: RE | Admit: 2024-07-07 | Discharge: 2024-07-07 | Disposition: A | Source: Ambulatory Visit | Attending: Acute Care | Admitting: Acute Care

## 2024-07-07 DIAGNOSIS — F17210 Nicotine dependence, cigarettes, uncomplicated: Secondary | ICD-10-CM

## 2024-07-07 DIAGNOSIS — Z87891 Personal history of nicotine dependence: Secondary | ICD-10-CM

## 2024-07-07 DIAGNOSIS — Z122 Encounter for screening for malignant neoplasm of respiratory organs: Secondary | ICD-10-CM

## 2024-07-08 ENCOUNTER — Other Ambulatory Visit: Payer: Self-pay | Admitting: Internal Medicine

## 2024-07-16 ENCOUNTER — Other Ambulatory Visit: Payer: Self-pay

## 2024-07-16 DIAGNOSIS — Z122 Encounter for screening for malignant neoplasm of respiratory organs: Secondary | ICD-10-CM

## 2024-07-16 DIAGNOSIS — Z87891 Personal history of nicotine dependence: Secondary | ICD-10-CM

## 2024-07-24 ENCOUNTER — Other Ambulatory Visit: Payer: Self-pay | Admitting: Internal Medicine

## 2024-07-26 ENCOUNTER — Other Ambulatory Visit: Payer: Self-pay | Admitting: Internal Medicine

## 2024-09-01 ENCOUNTER — Ambulatory Visit: Admitting: Internal Medicine
